# Patient Record
Sex: Male | Born: 1935 | Race: White | Hispanic: No | Smoking: Former smoker
Health system: Southern US, Community
[De-identification: ages and names within clinical notes are randomized; demographics above are authoritative.]

## PROBLEM LIST (undated history)

## (undated) DIAGNOSIS — E559 Vitamin D deficiency, unspecified: Secondary | ICD-10-CM

## (undated) DIAGNOSIS — E875 Hyperkalemia: Secondary | ICD-10-CM

## (undated) DIAGNOSIS — M810 Age-related osteoporosis without current pathological fracture: Secondary | ICD-10-CM

## (undated) DIAGNOSIS — H409 Unspecified glaucoma: Secondary | ICD-10-CM

## (undated) DIAGNOSIS — I739 Peripheral vascular disease, unspecified: Secondary | ICD-10-CM

## (undated) DIAGNOSIS — M1990 Unspecified osteoarthritis, unspecified site: Secondary | ICD-10-CM

## (undated) DIAGNOSIS — I10 Essential (primary) hypertension: Secondary | ICD-10-CM

## (undated) DIAGNOSIS — I714 Abdominal aortic aneurysm, without rupture, unspecified: Secondary | ICD-10-CM

## (undated) DIAGNOSIS — E119 Type 2 diabetes mellitus without complications: Secondary | ICD-10-CM

## (undated) DIAGNOSIS — E785 Hyperlipidemia, unspecified: Secondary | ICD-10-CM

## (undated) DIAGNOSIS — K4090 Unilateral inguinal hernia, without obstruction or gangrene, not specified as recurrent: Secondary | ICD-10-CM

## (undated) DIAGNOSIS — F0390 Unspecified dementia without behavioral disturbance: Secondary | ICD-10-CM

## (undated) DIAGNOSIS — H269 Unspecified cataract: Secondary | ICD-10-CM

## (undated) HISTORY — DX: Age-related osteoporosis without current pathological fracture: M81.0

## (undated) HISTORY — DX: Unspecified cataract: H26.9

## (undated) HISTORY — DX: Abdominal aortic aneurysm, without rupture, unspecified: I71.40

## (undated) HISTORY — DX: Type 2 diabetes mellitus without complications: E11.9

## (undated) HISTORY — DX: Vitamin D deficiency, unspecified: E55.9

## (undated) HISTORY — PX: TOOTH EXTRACTION: SUR596

## (undated) HISTORY — DX: Essential (primary) hypertension: I10

## (undated) HISTORY — DX: Unspecified dementia, unspecified severity, without behavioral disturbance, psychotic disturbance, mood disturbance, and anxiety: F03.90

## (undated) HISTORY — DX: Unspecified osteoarthritis, unspecified site: M19.90

## (undated) HISTORY — DX: Hyperlipidemia, unspecified: E78.5

## (undated) HISTORY — PX: CATARACT EXTRACTION: SUR2

## (undated) HISTORY — DX: Abdominal aortic aneurysm, without rupture: I71.4

## (undated) HISTORY — DX: Hyperkalemia: E87.5

## (undated) HISTORY — DX: Peripheral vascular disease, unspecified: I73.9

## (undated) HISTORY — DX: Unspecified glaucoma: H40.9

---

## 2008-04-10 ENCOUNTER — Ambulatory Visit: Payer: Self-pay | Admitting: Ophthalmology

## 2008-09-26 ENCOUNTER — Emergency Department: Payer: Self-pay | Admitting: Internal Medicine

## 2008-10-03 ENCOUNTER — Emergency Department: Payer: Self-pay | Admitting: Emergency Medicine

## 2010-06-03 ENCOUNTER — Ambulatory Visit: Payer: Self-pay | Admitting: Ophthalmology

## 2012-11-06 ENCOUNTER — Emergency Department: Payer: Self-pay | Admitting: Emergency Medicine

## 2013-06-18 DIAGNOSIS — C4491 Basal cell carcinoma of skin, unspecified: Secondary | ICD-10-CM

## 2013-06-18 HISTORY — DX: Basal cell carcinoma of skin, unspecified: C44.91

## 2014-02-28 ENCOUNTER — Ambulatory Visit: Payer: Self-pay | Admitting: Family Medicine

## 2014-03-28 ENCOUNTER — Ambulatory Visit: Payer: Self-pay | Admitting: Family Medicine

## 2014-04-04 ENCOUNTER — Ambulatory Visit: Payer: Self-pay | Admitting: Family Medicine

## 2014-07-30 ENCOUNTER — Ambulatory Visit: Payer: Self-pay | Admitting: Family Medicine

## 2014-08-06 DIAGNOSIS — M810 Age-related osteoporosis without current pathological fracture: Secondary | ICD-10-CM | POA: Diagnosis not present

## 2014-10-09 DIAGNOSIS — E784 Other hyperlipidemia: Secondary | ICD-10-CM | POA: Diagnosis not present

## 2014-10-09 DIAGNOSIS — I10 Essential (primary) hypertension: Secondary | ICD-10-CM | POA: Diagnosis not present

## 2014-11-04 ENCOUNTER — Encounter: Payer: Self-pay | Admitting: Family Medicine

## 2014-11-05 ENCOUNTER — Encounter: Payer: Self-pay | Admitting: Family Medicine

## 2014-11-05 DIAGNOSIS — K5790 Diverticulosis of intestine, part unspecified, without perforation or abscess without bleeding: Secondary | ICD-10-CM | POA: Insufficient documentation

## 2014-11-05 DIAGNOSIS — M4850XA Collapsed vertebra, not elsewhere classified, site unspecified, initial encounter for fracture: Secondary | ICD-10-CM | POA: Insufficient documentation

## 2014-11-05 DIAGNOSIS — R42 Dizziness and giddiness: Secondary | ICD-10-CM | POA: Insufficient documentation

## 2014-11-05 DIAGNOSIS — E785 Hyperlipidemia, unspecified: Secondary | ICD-10-CM | POA: Insufficient documentation

## 2014-11-05 DIAGNOSIS — E559 Vitamin D deficiency, unspecified: Secondary | ICD-10-CM | POA: Insufficient documentation

## 2014-11-05 DIAGNOSIS — I714 Abdominal aortic aneurysm, without rupture, unspecified: Secondary | ICD-10-CM | POA: Insufficient documentation

## 2014-11-05 DIAGNOSIS — N529 Male erectile dysfunction, unspecified: Secondary | ICD-10-CM | POA: Insufficient documentation

## 2014-11-05 DIAGNOSIS — R9431 Abnormal electrocardiogram [ECG] [EKG]: Secondary | ICD-10-CM | POA: Insufficient documentation

## 2014-11-05 DIAGNOSIS — M545 Low back pain, unspecified: Secondary | ICD-10-CM | POA: Insufficient documentation

## 2014-11-05 DIAGNOSIS — I7143 Infrarenal abdominal aortic aneurysm, without rupture: Secondary | ICD-10-CM | POA: Insufficient documentation

## 2014-11-05 DIAGNOSIS — L821 Other seborrheic keratosis: Secondary | ICD-10-CM | POA: Insufficient documentation

## 2014-11-05 DIAGNOSIS — Z9181 History of falling: Secondary | ICD-10-CM | POA: Insufficient documentation

## 2014-11-05 DIAGNOSIS — Z23 Encounter for immunization: Secondary | ICD-10-CM | POA: Insufficient documentation

## 2014-11-05 DIAGNOSIS — M1990 Unspecified osteoarthritis, unspecified site: Secondary | ICD-10-CM | POA: Insufficient documentation

## 2014-11-05 DIAGNOSIS — M47816 Spondylosis without myelopathy or radiculopathy, lumbar region: Secondary | ICD-10-CM | POA: Insufficient documentation

## 2014-11-05 DIAGNOSIS — E119 Type 2 diabetes mellitus without complications: Secondary | ICD-10-CM | POA: Insufficient documentation

## 2014-12-06 DIAGNOSIS — H4011X3 Primary open-angle glaucoma, severe stage: Secondary | ICD-10-CM | POA: Diagnosis not present

## 2015-01-09 ENCOUNTER — Encounter: Payer: Self-pay | Admitting: Family Medicine

## 2015-01-09 ENCOUNTER — Ambulatory Visit (INDEPENDENT_AMBULATORY_CARE_PROVIDER_SITE_OTHER): Payer: Commercial Managed Care - HMO | Admitting: Family Medicine

## 2015-01-09 VITALS — BP 118/60 | HR 88 | Resp 15 | Ht 67.0 in | Wt 176.8 lb

## 2015-01-09 DIAGNOSIS — E785 Hyperlipidemia, unspecified: Secondary | ICD-10-CM | POA: Diagnosis not present

## 2015-01-09 DIAGNOSIS — I10 Essential (primary) hypertension: Secondary | ICD-10-CM

## 2015-01-09 MED ORDER — LISINOPRIL 5 MG PO TABS: 5.0000 mg | ORAL_TABLET | Freq: Every day | ORAL | Status: DC

## 2015-01-09 MED ORDER — SIMVASTATIN 10 MG PO TABS: 10.0000 mg | ORAL_TABLET | Freq: Every day | ORAL | Status: DC

## 2015-01-09 NOTE — Progress Notes (Signed)
Name: Roy Hernandez   MRN: [id number]    DOB: [birth year]   Date:01/09/2015       Progress Note  Subjective  Chief Complaint  Chief Complaint  Patient presents with  . Hypertension  . Hyperlipidemia    Hypertension This is a chronic problem. The problem is controlled. Pertinent negatives include no blurred vision, chest pain, headaches, malaise/fatigue, palpitations or shortness of breath. Past treatments include ACE inhibitors. The current treatment provides significant improvement. There are no compliance problems.  There is no history of kidney disease, CAD/MI, CVA or heart failure.  Hyperlipidemia This is a chronic problem. Recent lipid tests were reviewed and are normal. Pertinent negatives include no chest pain, leg pain, myalgias or shortness of breath. Current antihyperlipidemic treatment includes statins.      Past Medical History  Diagnosis Date  . Hyperlipidemia   . AAA (abdominal aortic aneurysm)   . Hypertension   . Glaucoma   History reviewed. No pertinent past surgical history. Family History  Problem Relation Age of Onset  . Diabetes Father   . Diabetes Maternal Grandmother      History  Substance Use Topics  . Smoking status: Former Research scientist (life sciences)  . Smokeless tobacco: Never Used  . Alcohol Use: No     Current outpatient prescriptions:  .  alendronate (FOSAMAX) 70 MG tablet, Take 70 mg by mouth once a week. , Disp: , Rfl:  .  aspirin 81 MG tablet, Take 81 mg by mouth daily. , Disp: , Rfl:  .  cholecalciferol (VITAMIN D) 1000 UNITS tablet, Take 1,000 Units by mouth daily. , Disp: , Rfl:  .  latanoprost (XALATAN) 0.005 % ophthalmic solution, Apply to eye., Disp: , Rfl:  .  lisinopril (PRINIVIL,ZESTRIL) 5 MG tablet, 5 mg daily. , Disp: , Rfl:  .  meloxicam (MOBIC) 7.5 MG tablet, Take 7.5 mg by mouth daily. , Disp: , Rfl:  .  simvastatin (ZOCOR) 10 MG tablet, 10 mg daily at 6 PM. , Disp: , Rfl:  .  naproxen (NAPROSYN) 500 MG tablet, Take by mouth., Disp: , Rfl:    No Known Allergies  Review of Systems  Constitutional: Negative for fever, chills and malaise/fatigue.  Eyes: Negative for blurred vision.  Respiratory: Negative for shortness of breath.   Cardiovascular: Negative for chest pain and palpitations.  Musculoskeletal: Negative for myalgias.  Neurological: Negative for headaches.      Objective  Filed Vitals:   01/09/15 1031  BP: 118/60  Pulse: 88  Resp: 15  Height: '5\' 7"'  (1.702 m)  Weight: 176 lb 12.8 oz (80.196 kg)  SpO2: 96%     Physical Exam  Constitutional: He is well-developed, well-nourished, and in no distress.  HENT:  Head: Normocephalic and atraumatic.  Cardiovascular: Normal rate.   Pulmonary/Chest: Effort normal.  Nursing note and vitals reviewed.     No results found for this or any previous visit (from the past 2160 hour(s)).   Assessment & Plan 1. Benign hypertension  - lisinopril (PRINIVIL,ZESTRIL) 5 MG tablet; Take 1 tablet (5 mg total) by mouth daily.  2. Dyslipidemia  - simvastatin (ZOCOR) 10 MG tablet; Take 1 tablet (10 mg total) by mouth daily at 6 PM. - Lipid Profile - Comp Met (CMET)  There are no diagnoses linked to this encounter.   Jearl Soto Asad A. [facility] 01/09/2015 10:45 AM

## 2015-01-10 LAB — COMPREHENSIVE METABOLIC PANEL
ALBUMIN: 3.9 g/dL (ref 3.5–4.7)
ALT: 9 IU/L (ref 0–44)
AST: 16 IU/L (ref 0–40)
Albumin/Globulin Ratio: 1.3 (ref 1.1–2.5)
Alkaline Phosphatase: 67 IU/L (ref 39–117)
BUN / CREAT RATIO: 16 (ref 10–22)
BUN: 14 mg/dL (ref 8–27)
Bilirubin Total: 0.7 mg/dL (ref 0.0–1.2)
CALCIUM: 9.1 mg/dL (ref 8.6–10.2)
CHLORIDE: 102 mmol/L (ref 97–108)
CO2: 25 mmol/L (ref 18–29)
CREATININE: 0.85 mg/dL (ref 0.76–1.27)
GFR calc Af Amer: 92 mL/min/{1.73_m2} (ref >59)
GFR, EST NON AFRICAN AMERICAN: 79 mL/min/{1.73_m2} (ref >59)
GLOBULIN, TOTAL: 2.9 g/dL (ref 1.5–4.5)
GLUCOSE: 104 mg/dL — AB (ref 65–99)
POTASSIUM: 4.3 mmol/L (ref 3.5–5.2)
SODIUM: 142 mmol/L (ref 134–144)
Total Protein: 6.8 g/dL (ref 6.0–8.5)

## 2015-01-10 LAB — LIPID PANEL
CHOL/HDL RATIO: 2.8 ratio (ref 0.0–5.0)
CHOLESTEROL TOTAL: 111 mg/dL (ref 100–199)
HDL: 40 mg/dL (ref >39)
LDL CALC: 62 mg/dL (ref 0–99)
Triglycerides: 46 mg/dL (ref 0–149)
VLDL Cholesterol Cal: 9 mg/dL (ref 5–40)

## 2015-03-25 ENCOUNTER — Ambulatory Visit (INDEPENDENT_AMBULATORY_CARE_PROVIDER_SITE_OTHER): Payer: Commercial Managed Care - HMO | Admitting: Family Medicine

## 2015-03-25 ENCOUNTER — Encounter: Payer: Self-pay | Admitting: Family Medicine

## 2015-03-25 VITALS — BP 120/80 | HR 78 | Temp 98.0°F | Resp 19 | Ht 67.0 in | Wt 174.7 lb

## 2015-03-25 DIAGNOSIS — R42 Dizziness and giddiness: Secondary | ICD-10-CM

## 2015-03-25 NOTE — Progress Notes (Signed)
Name: Roy Hernandez   MRN: [id number]    DOB: [birth year]   Date:03/25/2015       Progress Note  Subjective  Chief Complaint  Chief Complaint  Patient presents with  . Acute Visit    BP check/dizziness  . Hypertension  . Hyperlipidemia  . Diabetes    Hypertension This is a chronic problem. The problem is controlled. Pertinent negatives include no chest pain, headaches, palpitations or shortness of breath. Past treatments include ACE inhibitors. There is no history of kidney disease, CAD/MI or CVA.  Patient reports dizziness upon standing from a seated position, which resolves within a few seconds. No other symptoms reported.   Past Medical History  Diagnosis Date  . Hyperlipidemia   . AAA (abdominal aortic aneurysm)   . Hypertension   . Glaucoma     History reviewed. No pertinent past surgical history.  Family History  Problem Relation Age of Onset  . Diabetes Father   . Diabetes Maternal Grandmother     Social History   Social History  . Marital Status: Married    Spouse Name: N/A  . Number of Children: N/A  . Years of Education: N/A   Occupational History  . Not on file.   Social History Main Topics  . Smoking status: Former Research scientist (life sciences)  . Smokeless tobacco: Never Used  . Alcohol Use: No  . Drug Use: No  . Sexual Activity: Not Currently   Other Topics Concern  . Not on file   Social History Narrative     Current outpatient prescriptions:  .  alendronate (FOSAMAX) 70 MG tablet, Take 70 mg by mouth once a week. , Disp: , Rfl:  .  aspirin 81 MG tablet, Take 81 mg by mouth daily. , Disp: , Rfl:  .  cholecalciferol (VITAMIN D) 1000 UNITS tablet, Take 1,000 Units by mouth daily. , Disp: , Rfl:  .  latanoprost (XALATAN) 0.005 % ophthalmic solution, Apply to eye., Disp: , Rfl:  .  lisinopril (PRINIVIL,ZESTRIL) 5 MG tablet, Take 1 tablet (5 mg total) by mouth daily., Disp: , Rfl:  .  meloxicam (MOBIC) 7.5 MG tablet, Take 7.5 mg by mouth daily. , Disp: , Rfl:  .   simvastatin (ZOCOR) 10 MG tablet, Take 1 tablet (10 mg total) by mouth daily at 6 PM., Disp: , Rfl:   No Known Allergies   Review of Systems  HENT: Negative for ear pain.   Respiratory: Negative for shortness of breath.   Cardiovascular: Negative for chest pain and palpitations.  Gastrointestinal: Negative for nausea, vomiting and abdominal pain.  Neurological: Negative for headaches.      Objective  Filed Vitals:   03/25/15 1556  BP: 120/80  Pulse: 78  Temp: 98 F (36.7 C)  TempSrc: Oral  Resp: 19  Height: 5\' 7"  (1.702 m)  Weight: 174 lb 11.2 oz (79.243 kg)  SpO2: 94%    Physical Exam  Constitutional: He is oriented to person, place, and time and well-developed, well-nourished, and in no distress.  HENT:  Head: Normocephalic and atraumatic.  Right Ear: Tympanic membrane and ear canal normal.  Left Ear: Tympanic membrane and ear canal normal.  Eyes: Pupils are equal, round, and reactive to light.  Cardiovascular: Normal rate and regular rhythm.   Pulmonary/Chest: Effort normal and breath sounds normal.  Abdominal: Soft. Bowel sounds are normal.  Musculoskeletal: He exhibits no edema.  Neurological: He is alert and oriented to person, place, and time.  Skin: Skin is warm and  dry.  Psychiatric: Mood, memory, affect and judgment normal.  Nursing note and vitals reviewed.   Assessment & Plan  1. Orthostatic dizziness Blood pressure is stable in her office today. Orthostatic vitals were normal. DC lisinopril and patient will check his blood pressure every day. Obtain labs for evaluation of dizziness.  - CBC w/Diff/Platelet - Comprehensive Metabolic Panel (CMET)   Wilbert Schouten Asad A. [facility] 03/25/2015 4:03 PM

## 2015-03-26 LAB — COMPREHENSIVE METABOLIC PANEL
A/G RATIO: 1.5 (ref 1.1–2.5)
ALT: 7 IU/L (ref 0–44)
AST: 15 IU/L (ref 0–40)
Albumin: 4 g/dL (ref 3.5–4.7)
Alkaline Phosphatase: 61 IU/L (ref 39–117)
BILIRUBIN TOTAL: 0.4 mg/dL (ref 0.0–1.2)
BUN/Creatinine Ratio: 16 (ref 10–22)
BUN: 14 mg/dL (ref 8–27)
CHLORIDE: 100 mmol/L (ref 97–108)
CO2: 24 mmol/L (ref 18–29)
Calcium: 9.1 mg/dL (ref 8.6–10.2)
Creatinine, Ser: 0.9 mg/dL (ref 0.76–1.27)
GFR calc Af Amer: 90 mL/min/{1.73_m2} (ref >59)
GFR, EST NON AFRICAN AMERICAN: 78 mL/min/{1.73_m2} (ref >59)
GLOBULIN, TOTAL: 2.6 g/dL (ref 1.5–4.5)
Glucose: 97 mg/dL (ref 65–99)
Potassium: 4.4 mmol/L (ref 3.5–5.2)
Sodium: 140 mmol/L (ref 134–144)
Total Protein: 6.6 g/dL (ref 6.0–8.5)

## 2015-03-26 LAB — CBC WITH DIFFERENTIAL/PLATELET
BASOS: 1 %
Basophils Absolute: 0 10*3/uL (ref 0.0–0.2)
EOS (ABSOLUTE): 0.2 10*3/uL (ref 0.0–0.4)
Eos: 3 %
Hematocrit: 39.7 % (ref 37.5–51.0)
Hemoglobin: 13 g/dL (ref 12.6–17.7)
Immature Grans (Abs): 0 10*3/uL (ref 0.0–0.1)
Immature Granulocytes: 0 %
LYMPHS ABS: 1.8 10*3/uL (ref 0.7–3.1)
Lymphs: 31 %
MCH: 31.3 pg (ref 26.6–33.0)
MCHC: 32.7 g/dL (ref 31.5–35.7)
MCV: 96 fL (ref 79–97)
Monocytes Absolute: 0.5 10*3/uL (ref 0.1–0.9)
Monocytes: 8 %
NEUTROS ABS: 3.3 10*3/uL (ref 1.4–7.0)
NEUTROS PCT: 57 %
PLATELETS: 206 10*3/uL (ref 150–379)
RBC: 4.15 x10E6/uL (ref 4.14–5.80)
RDW: 12.8 % (ref 12.3–15.4)
WBC: 5.7 10*3/uL (ref 3.4–10.8)

## 2015-04-16 ENCOUNTER — Ambulatory Visit: Payer: Commercial Managed Care - HMO | Admitting: Family Medicine

## 2015-04-25 ENCOUNTER — Ambulatory Visit: Payer: Commercial Managed Care - HMO | Admitting: Family Medicine

## 2015-04-30 ENCOUNTER — Encounter: Payer: Self-pay | Admitting: Family Medicine

## 2015-04-30 ENCOUNTER — Ambulatory Visit (INDEPENDENT_AMBULATORY_CARE_PROVIDER_SITE_OTHER): Payer: Commercial Managed Care - HMO | Admitting: Family Medicine

## 2015-04-30 VITALS — BP 121/80 | HR 78 | Temp 98.0°F | Resp 18 | Ht 67.0 in | Wt 176.8 lb

## 2015-04-30 DIAGNOSIS — H6123 Impacted cerumen, bilateral: Secondary | ICD-10-CM | POA: Diagnosis not present

## 2015-04-30 DIAGNOSIS — Z136 Encounter for screening for cardiovascular disorders: Secondary | ICD-10-CM | POA: Diagnosis not present

## 2015-04-30 DIAGNOSIS — Z23 Encounter for immunization: Secondary | ICD-10-CM

## 2015-04-30 DIAGNOSIS — IMO0001 Reserved for inherently not codable concepts without codable children: Secondary | ICD-10-CM

## 2015-04-30 NOTE — Progress Notes (Signed)
Name: Roy Hernandez   MRN: [id number]    DOB: 04-01-1928   Date:04/30/2015       Progress Note Subjective  Chief Complaint  Chief Complaint  Patient presents with  . Follow-up    1 mo  . Hyperlipidemia    Hypertension This is a chronic problem. The problem is unchanged. The problem is controlled. Pertinent negatives include no blurred vision, chest pain, headaches or palpitations. Past treatments include nothing (stopped Lisinopril.). There are no compliance problems.    Past Medical History  Diagnosis Date  . Hyperlipidemia   . AAA (abdominal aortic aneurysm)   . Hypertension   . Glaucoma     History reviewed. No pertinent past surgical history.  Family History  Problem Relation Age of Onset  . Diabetes Father   . Diabetes Maternal Grandmother     Social History   Social History  . Marital Status: Married    Spouse Name: N/A  . Number of Children: N/A  . Years of Education: N/A   Occupational History  . Not on file.   Social History Main Topics  . Smoking status: Former Research scientist (life sciences)  . Smokeless tobacco: Never Used  . Alcohol Use: No  . Drug Use: No  . Sexual Activity: Not Currently   Other Topics Concern  . Not on file   Social History Narrative    Current outpatient prescriptions:  .  alendronate (FOSAMAX) 70 MG tablet, Take 70 mg by mouth once a week. , Disp: , Rfl:  .  aspirin 81 MG tablet, Take 81 mg by mouth daily. , Disp: , Rfl:  .  cholecalciferol (VITAMIN D) 1000 UNITS tablet, Take 1,000 Units by mouth daily. , Disp: , Rfl:  .  latanoprost (XALATAN) 0.005 % ophthalmic solution, Apply to eye., Disp: , Rfl:  .  meloxicam (MOBIC) 7.5 MG tablet, Take 7.5 mg by mouth daily. , Disp: , Rfl:  .  Naproxen (NAPROSYN PO), , Disp: , Rfl:  .  ONE TOUCH ULTRA TEST test strip, , Disp: , Rfl: 1 .  simvastatin (ZOCOR) 10 MG tablet, Take 1 tablet (10 mg total) by mouth daily at 6 PM., Disp: , Rfl:  .  timolol (TIMOPTIC) 0.5 % ophthalmic solution, , Disp: , Rfl:   No  Known Allergies   Review of Systems  Eyes: Negative for blurred vision.  Cardiovascular: Negative for chest pain and palpitations.  Neurological: Negative for headaches.   Objective  Filed Vitals:   04/30/15 1445  BP: 121/80  Pulse: 78  Temp: 98 F (36.7 C)  TempSrc: Oral  Resp: 18  Height: 5\' 7"  (1.702 m)  Weight: 176 lb 12.8 oz (80.196 kg)  SpO2: 94%    Physical Exam  Constitutional: He is well-developed, well-nourished, and in no distress.  Cardiovascular: Normal rate and regular rhythm.   Pulmonary/Chest: Effort normal and breath sounds normal.  Abdominal: Soft. Bowel sounds are normal.  Nursing note and vitals reviewed.   Assessment & Plan  1. Need for influenza vaccination  - Flu vaccine HIGH DOSE PF (Fluzone High dose)  2. Cerumen impaction, bilateral  - Ear Lavage  3. Normal blood pressure The pressure is within the normal range. Patient is off of lisinopril. Reassured.    Syed Asad A. [facility] 04/30/2015 2:58 PM

## 2015-05-19 DIAGNOSIS — I739 Peripheral vascular disease, unspecified: Secondary | ICD-10-CM | POA: Diagnosis not present

## 2015-05-19 DIAGNOSIS — I724 Aneurysm of artery of lower extremity: Secondary | ICD-10-CM | POA: Diagnosis not present

## 2015-05-19 DIAGNOSIS — I10 Essential (primary) hypertension: Secondary | ICD-10-CM | POA: Diagnosis not present

## 2015-05-19 DIAGNOSIS — I714 Abdominal aortic aneurysm, without rupture: Secondary | ICD-10-CM | POA: Diagnosis not present

## 2015-05-19 DIAGNOSIS — E785 Hyperlipidemia, unspecified: Secondary | ICD-10-CM | POA: Diagnosis not present

## 2015-05-27 ENCOUNTER — Ambulatory Visit
Admission: RE | Admit: 2015-05-27 | Discharge: 2015-05-27 | Disposition: A | Discharge status: Home / Self Care | Payer: Commercial Managed Care - HMO | Source: Ambulatory Visit | Attending: Family Medicine | Admitting: Family Medicine

## 2015-05-27 ENCOUNTER — Ambulatory Visit (INDEPENDENT_AMBULATORY_CARE_PROVIDER_SITE_OTHER): Payer: Commercial Managed Care - HMO | Admitting: Family Medicine

## 2015-05-27 ENCOUNTER — Encounter: Payer: Self-pay | Admitting: Family Medicine

## 2015-05-27 VITALS — BP 128/64 | HR 79 | Temp 97.4°F | Resp 18 | Ht 67.0 in | Wt 176.0 lb

## 2015-05-27 DIAGNOSIS — M25559 Pain in unspecified hip: Secondary | ICD-10-CM | POA: Diagnosis present

## 2015-05-27 DIAGNOSIS — M25551 Pain in right hip: Secondary | ICD-10-CM

## 2015-05-27 DIAGNOSIS — M8580 Other specified disorders of bone density and structure, unspecified site: Secondary | ICD-10-CM | POA: Insufficient documentation

## 2015-05-27 DIAGNOSIS — M160 Bilateral primary osteoarthritis of hip: Secondary | ICD-10-CM | POA: Diagnosis not present

## 2015-05-27 NOTE — Progress Notes (Signed)
Name: Roy Hernandez   MRN: [id number]    DOB: January 24, 1928   Date:05/27/2015       Progress Note  Subjective  Chief Complaint  Chief Complaint  Patient presents with  . Fall    Onset-2 weeks, fell at home when walking to the bathroom at 2 a.m. on right hip has a contusion.  Patient right hip has been bothering him since the fall and putting muscle cream on sore hip.     Fall The accident occurred more than 1 week ago (10 days ago). The fall occurred while walking. He fell from a height of 3 to 5 ft. He landed on carpet. The volume of blood lost was minimal. The point of impact was the buttocks and right hip. The pain is present in the right hip. The pain is moderate. The symptoms are aggravated by ambulation (especially on hard surfaces). Pertinent negatives include no abdominal pain, fever, hematuria, numbness or tingling. He has tried rest (used a muscle rub on the affected area.) for the symptoms.    Past Medical History  Diagnosis Date  . Hyperlipidemia   . AAA (abdominal aortic aneurysm) ([place])   . Hypertension   . Glaucoma     No past surgical history on file.  Family History  Problem Relation Age of Onset  . Diabetes Father   . Diabetes Maternal Grandmother     Social History   Social History  . Marital Status: Married    Spouse Name: N/A  . Number of Children: N/A  . Years of Education: N/A   Occupational History  . Not on file.   Social History Main Topics  . Smoking status: Former Smoker -- 34 years    Types: Cigarettes    Start date: 08/02/1945    Quit date: 05/26/1980  . Smokeless tobacco: Never Used  . Alcohol Use: No  . Drug Use: No  . Sexual Activity: Not Currently   Other Topics Concern  . Not on file   Social History Narrative     Current outpatient prescriptions:  .  Alcohol Swabs (ALCOHOL PREP) PADS, , Disp: , Rfl:  .  alendronate (FOSAMAX) 70 MG tablet, Take 70 mg by mouth once a week. , Disp: , Rfl:  .  aspirin 81 MG tablet, Take 81 mg  by mouth daily. , Disp: , Rfl:  .  cholecalciferol (VITAMIN D) 1000 UNITS tablet, Take 1,000 Units by mouth daily. , Disp: , Rfl:  .  latanoprost (XALATAN) 0.005 % ophthalmic solution, Apply to eye., Disp: , Rfl:  .  Naproxen (NAPROSYN PO), , Disp: , Rfl:  .  ONE TOUCH ULTRA TEST test strip, , Disp: , Rfl: 1 .  simvastatin (ZOCOR) 10 MG tablet, Take 1 tablet (10 mg total) by mouth daily at 6 PM., Disp: , Rfl:  .  timolol (TIMOPTIC) 0.5 % ophthalmic solution, , Disp: , Rfl:   No Known Allergies   Review of Systems  Constitutional: Negative for fever and chills.  Gastrointestinal: Negative for abdominal pain.  Genitourinary: Negative for dysuria and hematuria.  Musculoskeletal: Positive for joint pain.  Neurological: Negative for dizziness, tingling and numbness.      Objective  Filed Vitals:   05/27/15 1145  BP: 128/64  Pulse: 79  Temp: 97.4 F (36.3 C)  TempSrc: Oral  Resp: 18  Height: 5\' 7"  (1.702 m)  Weight: 176 lb (79.833 kg)  SpO2: 96%    Physical Exam  Constitutional: He is well-developed, well-nourished, and in  no distress.  Musculoskeletal:       Right hip: He exhibits normal range of motion, normal strength, no tenderness, no swelling, no deformity and no laceration.  Nursing note and vitals reviewed.  Assessment & Plan  1. Acute pain of right hip We will rule out acute fracture. An x-ray of right hip and pelvis. Encouraged to take Tylenol as needed and apply a heating pad to the affected area. Hematoma has resolved. - DG HIP UNILAT WITH PELVIS 2-3 VIEWS RIGHT; Future   Elea Holtzclaw Asad A. [facility] 05/27/2015 11:59 AM

## 2015-06-05 DIAGNOSIS — H401133 Primary open-angle glaucoma, bilateral, severe stage: Secondary | ICD-10-CM | POA: Diagnosis not present

## 2015-06-13 DIAGNOSIS — H401133 Primary open-angle glaucoma, bilateral, severe stage: Secondary | ICD-10-CM | POA: Diagnosis not present

## 2015-06-30 DIAGNOSIS — D1800 Hemangioma unspecified site: Secondary | ICD-10-CM | POA: Diagnosis not present

## 2015-06-30 DIAGNOSIS — L700 Acne vulgaris: Secondary | ICD-10-CM | POA: Diagnosis not present

## 2015-06-30 DIAGNOSIS — D229 Melanocytic nevi, unspecified: Secondary | ICD-10-CM | POA: Diagnosis not present

## 2015-06-30 DIAGNOSIS — Z85828 Personal history of other malignant neoplasm of skin: Secondary | ICD-10-CM | POA: Diagnosis not present

## 2015-06-30 DIAGNOSIS — D692 Other nonthrombocytopenic purpura: Secondary | ICD-10-CM | POA: Diagnosis not present

## 2015-06-30 DIAGNOSIS — L821 Other seborrheic keratosis: Secondary | ICD-10-CM | POA: Diagnosis not present

## 2015-06-30 DIAGNOSIS — L570 Actinic keratosis: Secondary | ICD-10-CM | POA: Diagnosis not present

## 2015-06-30 DIAGNOSIS — Z1283 Encounter for screening for malignant neoplasm of skin: Secondary | ICD-10-CM | POA: Diagnosis not present

## 2015-07-11 ENCOUNTER — Encounter: Payer: Self-pay | Admitting: Family Medicine

## 2015-07-11 ENCOUNTER — Other Ambulatory Visit: Payer: Self-pay | Admitting: Family Medicine

## 2015-07-11 ENCOUNTER — Ambulatory Visit (INDEPENDENT_AMBULATORY_CARE_PROVIDER_SITE_OTHER): Payer: Commercial Managed Care - HMO | Admitting: Family Medicine

## 2015-07-11 VITALS — BP 130/65 | HR 73 | Temp 98.1°F | Resp 17 | Ht 67.0 in | Wt 179.1 lb

## 2015-07-11 DIAGNOSIS — IMO0001 Reserved for inherently not codable concepts without codable children: Secondary | ICD-10-CM

## 2015-07-11 DIAGNOSIS — I714 Abdominal aortic aneurysm, without rupture, unspecified: Secondary | ICD-10-CM

## 2015-07-11 DIAGNOSIS — E785 Hyperlipidemia, unspecified: Secondary | ICD-10-CM | POA: Diagnosis not present

## 2015-07-11 DIAGNOSIS — R7309 Other abnormal glucose: Secondary | ICD-10-CM | POA: Diagnosis not present

## 2015-07-11 DIAGNOSIS — Z136 Encounter for screening for cardiovascular disorders: Secondary | ICD-10-CM

## 2015-07-11 LAB — GLUCOSE, POCT (MANUAL RESULT ENTRY): POC Glucose: 118 mg/dl — AB (ref 70–99)

## 2015-07-11 LAB — POCT GLYCOSYLATED HEMOGLOBIN (HGB A1C): HEMOGLOBIN A1C: 5.4

## 2015-07-11 NOTE — Progress Notes (Signed)
Name: Roy Hernandez   MRN: [id number]    DOB: [birth year]   Date:07/11/2015       Progress Note  Subjective  Chief Complaint  Chief Complaint  Patient presents with  . Follow-up    6 mo  . Hyperlipidemia  . Hypertension    Hyperlipidemia This is a chronic problem. The problem is controlled. Recent lipid tests were reviewed and are normal. Pertinent negatives include no chest pain, leg pain, myalgias or shortness of breath. Current antihyperlipidemic treatment includes statins. There are no compliance problems.   Hypertension This is a chronic problem. The problem is unchanged. The problem is controlled. Pertinent negatives include no blurred vision, chest pain, headaches, orthopnea, palpitations or shortness of breath. Past treatments include nothing. There are no compliance problems.  There is no history of kidney disease, CAD/MI or CVA.   Pt. Has history of Diabetes, last A1c 5.7 %, controlled on dietary and lifestyle therapies only. No medications. Will repeat A1c today.  Past Medical History  Diagnosis Date  . Hyperlipidemia   . AAA (abdominal aortic aneurysm) ([place])   . Hypertension   . Glaucoma     History reviewed. No pertinent past surgical history.  Family History  Problem Relation Age of Onset  . Diabetes Father   . Diabetes Maternal Grandmother     Social History   Social History  . Marital Status: Married    Spouse Name: N/A  . Number of Children: N/A  . Years of Education: N/A   Occupational History  . Not on file.   Social History Main Topics  . Smoking status: Former Smoker -- 34 years    Types: Cigarettes    Start date: 08/02/1945    Quit date: 05/26/1980  . Smokeless tobacco: Never Used  . Alcohol Use: No  . Drug Use: No  . Sexual Activity: Not Currently   Other Topics Concern  . Not on file   Social History Narrative     Current outpatient prescriptions:  .  Alcohol Swabs (ALCOHOL PREP) PADS, , Disp: , Rfl:  .  alendronate (FOSAMAX) 70  MG tablet, Take 70 mg by mouth once a week. , Disp: , Rfl:  .  aspirin 81 MG tablet, Take 81 mg by mouth daily. , Disp: , Rfl:  .  cholecalciferol (VITAMIN D) 1000 UNITS tablet, Take 1,000 Units by mouth daily. , Disp: , Rfl:  .  latanoprost (XALATAN) 0.005 % ophthalmic solution, Apply to eye., Disp: , Rfl:  .  Naproxen (NAPROSYN PO), , Disp: , Rfl:  .  ONE TOUCH ULTRA TEST test strip, , Disp: , Rfl: 1 .  Phenylephrine-Acetaminophen 5-325 MG TABS, , Disp: , Rfl:  .  simvastatin (ZOCOR) 10 MG tablet, Take 1 tablet (10 mg total) by mouth daily at 6 PM., Disp: , Rfl:  .  timolol (TIMOPTIC) 0.5 % ophthalmic solution, , Disp: , Rfl:   No Known Allergies   Review of Systems  Eyes: Negative for blurred vision.  Respiratory: Negative for shortness of breath.   Cardiovascular: Negative for chest pain, palpitations and orthopnea.  Musculoskeletal: Negative for myalgias.  Neurological: Negative for headaches.     Objective  Filed Vitals:   07/11/15 0802  BP: 130/65  Pulse: 73  Temp: 98.1 F (36.7 C)  TempSrc: Oral  Resp: 17  Height: 5\' 7"  (1.702 m)  Weight: 179 lb 1.6 oz (81.239 kg)  SpO2: 96%    Physical Exam  Constitutional: He is oriented to person, place,  and time and well-developed, well-nourished, and in no distress.  HENT:  Head: Normocephalic and atraumatic.  Eyes: Pupils are equal, round, and reactive to light.  Cardiovascular: Normal rate, regular rhythm and normal heart sounds.   Pulmonary/Chest: Effort normal and breath sounds normal.  Abdominal: Soft. Bowel sounds are normal.  Musculoskeletal: He exhibits no edema.  Neurological: He is alert and oriented to person, place, and time.  Nursing note and vitals reviewed.    Assessment & Plan  1. AAA (abdominal aortic aneurysm) without rupture [facility]) Being followed by Vascular Surgery. Notes not available for review but pt. Tells me that the aneurysm has grown 'just a little.' No records available. BP  well-controlled.  2. Elevated glycated hemoglobin In the past. Will obtain updated Blood Glucose and A1c today. - POCT HgB A1C - POCT Glucose (CBG)  3. Normal blood pressure BP at goal, off anti-hypertensive therapy. - Comprehensive Metabolic Panel (CMET)  4. Hyperlipidemia  - Lipid Profile   Serayah Yazdani Asad A. [facility] 07/11/2015 8:23 AM

## 2015-07-12 LAB — COMPREHENSIVE METABOLIC PANEL
ALK PHOS: 67 IU/L (ref 39–117)
ALT: 9 IU/L (ref 0–44)
AST: 19 IU/L (ref 0–40)
Albumin/Globulin Ratio: 1.5 (ref 1.1–2.5)
Albumin: 4.1 g/dL (ref 3.5–4.7)
BUN/Creatinine Ratio: 18 (ref 10–22)
BUN: 15 mg/dL (ref 8–27)
Bilirubin Total: 0.6 mg/dL (ref 0.0–1.2)
CO2: 26 mmol/L (ref 18–29)
CREATININE: 0.83 mg/dL (ref 0.76–1.27)
Calcium: 9.7 mg/dL (ref 8.6–10.2)
Chloride: 103 mmol/L (ref 97–106)
GFR calc Af Amer: 93 mL/min/{1.73_m2} (ref >59)
GFR calc non Af Amer: 80 mL/min/{1.73_m2} (ref >59)
GLOBULIN, TOTAL: 2.8 g/dL (ref 1.5–4.5)
GLUCOSE: 109 mg/dL — AB (ref 65–99)
Potassium: 5.3 mmol/L — ABNORMAL HIGH (ref 3.5–5.2)
SODIUM: 145 mmol/L — AB (ref 136–144)
Total Protein: 6.9 g/dL (ref 6.0–8.5)

## 2015-07-12 LAB — LIPID PANEL
CHOL/HDL RATIO: 3 ratio (ref 0.0–5.0)
Cholesterol, Total: 119 mg/dL (ref 100–199)
HDL: 40 mg/dL (ref >39)
LDL CALC: 69 mg/dL (ref 0–99)
Triglycerides: 51 mg/dL (ref 0–149)
VLDL CHOLESTEROL CAL: 10 mg/dL (ref 5–40)

## 2015-07-22 ENCOUNTER — Telehealth: Payer: Self-pay | Admitting: Family Medicine

## 2015-07-22 NOTE — Telephone Encounter (Signed)
Patient was notified that he has 11 refills on this medication and he can call pharmacy and refill, patient verbalized understanding

## 2015-07-22 NOTE — Telephone Encounter (Signed)
Pt has questions about his strips. Please call him back.

## 2015-07-29 ENCOUNTER — Telehealth: Payer: Self-pay | Admitting: Family Medicine

## 2015-07-29 NOTE — Telephone Encounter (Signed)
REFILL NEEDED FOR SIMVASTATIN. PHARM IS [facility].

## 2015-07-30 ENCOUNTER — Ambulatory Visit: Payer: Commercial Managed Care - HMO | Admitting: Family Medicine

## 2015-07-31 NOTE — Telephone Encounter (Signed)
ERRENOUS °

## 2015-08-19 ENCOUNTER — Ambulatory Visit
Admission: RE | Admit: 2015-08-19 | Discharge: 2015-08-19 | Disposition: A | Discharge status: Home / Self Care | Payer: PPO | Source: Ambulatory Visit | Attending: Family Medicine | Admitting: Family Medicine

## 2015-08-19 ENCOUNTER — Encounter: Payer: Self-pay | Admitting: Family Medicine

## 2015-08-19 ENCOUNTER — Ambulatory Visit (INDEPENDENT_AMBULATORY_CARE_PROVIDER_SITE_OTHER): Payer: PPO | Admitting: Family Medicine

## 2015-08-19 VITALS — BP 130/62 | HR 93 | Temp 98.7°F | Resp 19 | Ht 67.0 in | Wt 179.0 lb

## 2015-08-19 DIAGNOSIS — M47816 Spondylosis without myelopathy or radiculopathy, lumbar region: Secondary | ICD-10-CM | POA: Insufficient documentation

## 2015-08-19 DIAGNOSIS — M25552 Pain in left hip: Secondary | ICD-10-CM | POA: Diagnosis not present

## 2015-08-19 DIAGNOSIS — M4856XA Collapsed vertebra, not elsewhere classified, lumbar region, initial encounter for fracture: Secondary | ICD-10-CM | POA: Diagnosis not present

## 2015-08-19 DIAGNOSIS — I2510 Atherosclerotic heart disease of native coronary artery without angina pectoris: Secondary | ICD-10-CM | POA: Diagnosis not present

## 2015-08-19 DIAGNOSIS — M160 Bilateral primary osteoarthritis of hip: Secondary | ICD-10-CM | POA: Insufficient documentation

## 2015-08-19 DIAGNOSIS — M8580 Other specified disorders of bone density and structure, unspecified site: Secondary | ICD-10-CM | POA: Diagnosis not present

## 2015-08-19 MED ORDER — TIZANIDINE HCL 2 MG PO TABS: 2.0000 mg | ORAL_TABLET | Freq: Three times a day (TID) | ORAL | Status: DC | PRN

## 2015-08-19 MED ORDER — NAPROXEN 250 MG PO TABS: 250.0000 mg | ORAL_TABLET | Freq: Two times a day (BID) | ORAL | Status: DC

## 2015-08-19 NOTE — Progress Notes (Signed)
Name: RAYSHAUN ALFIERI   MRN: [id number]    DOB: 1927/10/24   Date:08/19/2015       Progress Note  Subjective  Chief Complaint  Chief Complaint  Patient presents with  . Follow-up    Left hip pain  . Hyperlipidemia    Hip Pain  Incident onset: At home 4 days ago when he was vacuuming. The incident occurred at home. The injury mechanism was a twisting injury. The pain is present in the left hip. Quality: sharp non-radiating pain. The pain is at a severity of 5/10. The pain has been fluctuating since onset. Pertinent negatives include no inability to bear weight, loss of motion, numbness or tingling. The symptoms are aggravated by movement (changing position, ). He has tried heat for the symptoms.    Past Medical History  Diagnosis Date  . Hyperlipidemia   . AAA (abdominal aortic aneurysm) ([place])   . Hypertension   . Glaucoma     History reviewed. No pertinent past surgical history.  Family History  Problem Relation Age of Onset  . Diabetes Father   . Diabetes Maternal Grandmother     Social History   Social History  . Marital Status: Married    Spouse Name: N/A  . Number of Children: N/A  . Years of Education: N/A   Occupational History  . Not on file.   Social History Main Topics  . Smoking status: Former Smoker -- 34 years    Types: Cigarettes    Start date: 08/02/1945    Quit date: 05/26/1980  . Smokeless tobacco: Never Used  . Alcohol Use: No  . Drug Use: No  . Sexual Activity: Not Currently   Other Topics Concern  . Not on file   Social History Narrative     Current outpatient prescriptions:  .  Alcohol Swabs (ALCOHOL PREP) PADS, , Disp: , Rfl:  .  alendronate (FOSAMAX) 70 MG tablet, take 1 tablet by mouth every week, Disp: 12 tablet, Rfl: 3 .  aspirin 81 MG tablet, Take 81 mg by mouth daily. , Disp: , Rfl:  .  cholecalciferol (VITAMIN D) 1000 UNITS tablet, Take 1,000 Units by mouth daily. , Disp: , Rfl:  .  latanoprost (XALATAN) 0.005 % ophthalmic  solution, Apply to eye., Disp: , Rfl:  .  ONE TOUCH ULTRA TEST test strip, TEST BLOOD SUGAR ONCE A DAY, Disp: 30 each, Rfl: 11 .  simvastatin (ZOCOR) 10 MG tablet, Take 1 tablet (10 mg total) by mouth daily at 6 PM., Disp: , Rfl:  .  timolol (TIMOPTIC) 0.5 % ophthalmic solution, , Disp: , Rfl:   No Known Allergies   Review of Systems  Constitutional: Negative for fever, chills, weight loss and malaise/fatigue.  Musculoskeletal: Positive for myalgias and back pain.  Neurological: Negative for tingling and numbness.     Objective  Filed Vitals:   08/19/15 1326  BP: 130/62  Pulse: 93  Temp: 98.7 F (37.1 C)  TempSrc: Oral  Resp: 19  Height: 5\' 7"  (1.702 m)  Weight: 179 lb (81.194 kg)  SpO2: 94%    Physical Exam  Musculoskeletal:       Left hip: He exhibits tenderness. He exhibits no swelling and no deformity.       Legs: Mild tenderness to palpation over the left superior gluteal area, pain worse with flexion and internal rotation of left hip.  Nursing note and vitals reviewed.     Assessment & Plan  1. Acute pain of left hip  Likely from muscle spasm. Will start on NSAID and muscle relaxant therapy. Obtain x-ray of left hip for evaluation. - DG HIP UNILAT WITH PELVIS 2-3 VIEWS LEFT; Future - naproxen (NAPROSYN) 250 MG tablet; Take 1 tablet (250 mg total) by mouth 2 (two) times daily with a meal.  Dispense: 20 tablet; Refill: 0 - tiZANidine (ZANAFLEX) 2 MG tablet; Take 1 tablet (2 mg total) by mouth every 8 (eight) hours as needed for muscle spasms.  Dispense: 30 tablet; Refill: 0  Kayah Hecker Asad A. [facility] 08/19/2015 1:46 PM

## 2015-08-27 ENCOUNTER — Encounter: Payer: Self-pay | Admitting: Family Medicine

## 2015-08-27 ENCOUNTER — Ambulatory Visit (INDEPENDENT_AMBULATORY_CARE_PROVIDER_SITE_OTHER): Payer: PPO | Admitting: Family Medicine

## 2015-08-27 VITALS — BP 138/68 | HR 88 | Temp 97.5°F | Resp 16 | Ht 67.0 in | Wt 180.2 lb

## 2015-08-27 DIAGNOSIS — M1990 Unspecified osteoarthritis, unspecified site: Secondary | ICD-10-CM | POA: Diagnosis not present

## 2015-08-27 DIAGNOSIS — M1612 Unilateral primary osteoarthritis, left hip: Secondary | ICD-10-CM

## 2015-08-27 NOTE — Progress Notes (Signed)
Name: Roy Hernandez   MRN: [id number]    DOB: 23-Jan-1928   Date:08/27/2015       Progress Note  Subjective  Chief Complaint  Chief Complaint  Patient presents with  . acute hip pain    pt here for 10 day follow up for hip pain and xray results    Hip Pain  The incident occurred at home. The injury mechanism was a twisting injury. Pain location: pain has resolved. The patient is experiencing no pain. The pain has been improving since onset. Pertinent negatives include no loss of motion, loss of sensation, numbness or tingling. He has tried heat and NSAIDs for the symptoms. The treatment provided significant relief.  left-sided low back and hip pain, now resolved with muscle relaxant and NSAID therapy. X-rays of left hip showed arthritis in the lumbar spine and both hips. Reviewed with in detail.  Past Medical History  Diagnosis Date  . Hyperlipidemia   . AAA (abdominal aortic aneurysm) ([place])   . Hypertension   . Glaucoma     No past surgical history on file.  Family History  Problem Relation Age of Onset  . Diabetes Father   . Diabetes Maternal Grandmother     Social History   Social History  . Marital Status: Married    Spouse Name: N/A  . Number of Children: N/A  . Years of Education: N/A   Occupational History  . Not on file.   Social History Main Topics  . Smoking status: Former Smoker -- 34 years    Types: Cigarettes    Start date: 08/02/1945    Quit date: 05/26/1980  . Smokeless tobacco: Never Used  . Alcohol Use: No  . Drug Use: No  . Sexual Activity: Not Currently   Other Topics Concern  . Not on file   Social History Narrative    Current outpatient prescriptions:  .  Alcohol Swabs (ALCOHOL PREP) PADS, , Disp: , Rfl:  .  alendronate (FOSAMAX) 70 MG tablet, take 1 tablet by mouth every week, Disp: 12 tablet, Rfl: 3 .  aspirin 81 MG tablet, Take 81 mg by mouth daily. , Disp: , Rfl:  .  cholecalciferol (VITAMIN D) 1000 UNITS tablet, Take 1,000 Units by  mouth daily. , Disp: , Rfl:  .  latanoprost (XALATAN) 0.005 % ophthalmic solution, Apply to eye., Disp: , Rfl:  .  naproxen (NAPROSYN) 250 MG tablet, Take 1 tablet (250 mg total) by mouth 2 (two) times daily with a meal., Disp: 20 tablet, Rfl: 0 .  ONE TOUCH ULTRA TEST test strip, TEST BLOOD SUGAR ONCE A DAY, Disp: 30 each, Rfl: 11 .  simvastatin (ZOCOR) 10 MG tablet, Take 1 tablet (10 mg total) by mouth daily at 6 PM., Disp: , Rfl:  .  timolol (TIMOPTIC) 0.5 % ophthalmic solution, , Disp: , Rfl:  .  tiZANidine (ZANAFLEX) 2 MG tablet, Take 1 tablet (2 mg total) by mouth every 8 (eight) hours as needed for muscle spasms., Disp: 30 tablet, Rfl: 0  No Known Allergies   Review of Systems  Constitutional: Negative for fever, chills and weight loss.  Neurological: Negative for tingling and numbness.     Objective  Filed Vitals:   08/27/15 1622  BP: 138/68  Pulse: 88  Temp: 97.5 F (36.4 C)  Resp: 16  Height: 5\' 7"  (1.702 m)  Weight: 180 lb 3 oz (81.733 kg)  SpO2: 96%    Physical Exam  Constitutional: He is oriented to person,  place, and time and well-developed, well-nourished, and in no distress.  Cardiovascular: Normal rate and regular rhythm.   Pulmonary/Chest: Effort normal and breath sounds normal.  Neurological: He is alert and oriented to person, place, and time.  Nursing note and vitals reviewed.    Assessment & Plan  1. Arthritis of left hip Advised to slowly increase back to his previous level of activity. May take OTC NSAIDs as needed.     Kani Jobson Asad A. [facility] 08/27/2015 4:36 PM

## 2015-11-25 ENCOUNTER — Encounter: Payer: Self-pay | Admitting: Family Medicine

## 2015-11-25 ENCOUNTER — Ambulatory Visit (INDEPENDENT_AMBULATORY_CARE_PROVIDER_SITE_OTHER): Payer: PPO | Admitting: Family Medicine

## 2015-11-25 VITALS — BP 136/71 | HR 94 | Temp 97.5°F | Resp 18 | Ht 67.0 in | Wt 178.5 lb

## 2015-11-25 DIAGNOSIS — M810 Age-related osteoporosis without current pathological fracture: Secondary | ICD-10-CM | POA: Diagnosis not present

## 2015-11-25 NOTE — Progress Notes (Signed)
Name: Roy Hernandez   MRN: [id number]    DOB: 1928-02-25   Date:11/25/2015       Progress Note  Subjective  Chief Complaint  Chief Complaint  Patient presents with  . Follow-up    3 mo    HPI  Pt. Is here for follow up of Osteoporosis, diagnosed in Left Femur neck in December 2015.  He has been on Alendronate 70 mg every week along with Calcium and Vitamin D. Denies any back or hip pain at present,  Past Medical History  Diagnosis Date  . Hyperlipidemia   . AAA (abdominal aortic aneurysm) ([place])   . Hypertension   . Glaucoma     History reviewed. No pertinent past surgical history.  Family History  Problem Relation Age of Onset  . Diabetes Father   . Diabetes Maternal Grandmother     Social History   Social History  . Marital Status: Married    Spouse Name: N/A  . Number of Children: N/A  . Years of Education: N/A   Occupational History  . Not on file.   Social History Main Topics  . Smoking status: Former Smoker -- 34 years    Types: Cigarettes    Start date: 08/02/1945    Quit date: 05/26/1980  . Smokeless tobacco: Never Used  . Alcohol Use: No  . Drug Use: No  . Sexual Activity: Not Currently   Other Topics Concern  . Not on file   Social History Narrative     Current outpatient prescriptions:  .  Alcohol Swabs (ALCOHOL PREP) PADS, , Disp: , Rfl:  .  alendronate (FOSAMAX) 70 MG tablet, take 1 tablet by mouth every week, Disp: 12 tablet, Rfl: 3 .  aspirin 81 MG tablet, Take 81 mg by mouth daily. , Disp: , Rfl:  .  cholecalciferol (VITAMIN D) 1000 UNITS tablet, Take 1,000 Units by mouth daily. , Disp: , Rfl:  .  latanoprost (XALATAN) 0.005 % ophthalmic solution, Apply to eye., Disp: , Rfl:  .  naproxen (NAPROSYN) 250 MG tablet, Take 1 tablet (250 mg total) by mouth 2 (two) times daily with a meal., Disp: 20 tablet, Rfl: 0 .  ONE TOUCH ULTRA TEST test strip, TEST BLOOD SUGAR ONCE A DAY, Disp: 30 each, Rfl: 11 .  simvastatin (ZOCOR) 10 MG tablet, Take  1 tablet (10 mg total) by mouth daily at 6 PM., Disp: , Rfl:  .  timolol (TIMOPTIC) 0.5 % ophthalmic solution, , Disp: , Rfl:  .  tiZANidine (ZANAFLEX) 2 MG tablet, Take 1 tablet (2 mg total) by mouth every 8 (eight) hours as needed for muscle spasms., Disp: 30 tablet, Rfl: 0  No Known Allergies   Review of Systems  Musculoskeletal: Positive for back pain. Negative for joint pain.    Objective  Filed Vitals:   11/25/15 1538  BP: 136/71  Pulse: 94  Temp: 97.5 F (36.4 C)  TempSrc: Oral  Resp: 18  Height: 5\' 7"  (1.702 m)  Weight: 178 lb 8 oz (80.967 kg)  SpO2: 96%    Physical Exam  Constitutional: He is oriented to person, place, and time and well-developed, well-nourished, and in no distress.  HENT:  Head: Normocephalic and atraumatic.  Cardiovascular: Normal rate and regular rhythm.   Pulmonary/Chest: Effort normal and breath sounds normal.  Neurological: He is alert and oriented to person, place, and time.  Nursing note and vitals reviewed.    Assessment & Plan  1. Osteoporosis We will repeat DEXA scan  for assessment of bone density and osteoporosis in the left femur. - DG Bone Density; Future   Roy Hernandez Asad A. [facility] 11/25/2015 4:00 PM

## 2016-01-08 ENCOUNTER — Ambulatory Visit (INDEPENDENT_AMBULATORY_CARE_PROVIDER_SITE_OTHER): Payer: PPO | Admitting: Family Medicine

## 2016-01-08 ENCOUNTER — Encounter: Payer: Self-pay | Admitting: Family Medicine

## 2016-01-08 VITALS — BP 128/70 | HR 78 | Temp 97.4°F | Resp 15 | Ht 67.0 in | Wt 177.1 lb

## 2016-01-08 DIAGNOSIS — E785 Hyperlipidemia, unspecified: Secondary | ICD-10-CM

## 2016-01-08 DIAGNOSIS — E875 Hyperkalemia: Secondary | ICD-10-CM | POA: Diagnosis not present

## 2016-01-08 DIAGNOSIS — E870 Hyperosmolality and hypernatremia: Secondary | ICD-10-CM | POA: Diagnosis not present

## 2016-01-08 NOTE — Progress Notes (Signed)
Name: Roy Hernandez   MRN: [id number]    DOB: Jan 19, 1928   Date:01/08/2016       Progress Note  Subjective  Chief Complaint  Chief Complaint  Patient presents with  . Follow-up    6 mo    Hyperlipidemia This is a chronic problem. The problem is controlled. Recent lipid tests were reviewed and are normal. Pertinent negatives include no leg pain, myalgias or shortness of breath. Current antihyperlipidemic treatment includes statins.    Past Medical History  Diagnosis Date  . Hyperlipidemia   . AAA (abdominal aortic aneurysm) ([place])   . Hypertension   . Glaucoma     History reviewed. No pertinent past surgical history.  Family History  Problem Relation Age of Onset  . Diabetes Father   . Diabetes Maternal Grandmother     Social History   Social History  . Marital Status: Married    Spouse Name: N/A  . Number of Children: N/A  . Years of Education: N/A   Occupational History  . Not on file.   Social History Main Topics  . Smoking status: Former Smoker -- 34 years    Types: Cigarettes    Start date: 08/02/1945    Quit date: 05/26/1980  . Smokeless tobacco: Never Used  . Alcohol Use: No  . Drug Use: No  . Sexual Activity: Not Currently   Other Topics Concern  . Not on file   Social History Narrative     Current outpatient prescriptions:  .  Alcohol Swabs (ALCOHOL PREP) PADS, , Disp: , Rfl:  .  alendronate (FOSAMAX) 70 MG tablet, take 1 tablet by mouth every week, Disp: 12 tablet, Rfl: 3 .  aspirin 81 MG tablet, Take 81 mg by mouth daily. , Disp: , Rfl:  .  cholecalciferol (VITAMIN D) 1000 UNITS tablet, Take 1,000 Units by mouth daily. , Disp: , Rfl:  .  latanoprost (XALATAN) 0.005 % ophthalmic solution, Apply to eye., Disp: , Rfl:  .  naproxen (NAPROSYN) 250 MG tablet, Take 1 tablet (250 mg total) by mouth 2 (two) times daily with a meal., Disp: 20 tablet, Rfl: 0 .  ONE TOUCH ULTRA TEST test strip, TEST BLOOD SUGAR ONCE A DAY, Disp: 30 each, Rfl: 11 .   simvastatin (ZOCOR) 10 MG tablet, Take 1 tablet (10 mg total) by mouth daily at 6 PM., Disp: , Rfl:  .  timolol (TIMOPTIC) 0.5 % ophthalmic solution, , Disp: , Rfl:  .  tiZANidine (ZANAFLEX) 2 MG tablet, Take 1 tablet (2 mg total) by mouth every 8 (eight) hours as needed for muscle spasms., Disp: 30 tablet, Rfl: 0  No Known Allergies   Review of Systems  Respiratory: Negative for shortness of breath.   Musculoskeletal: Negative for myalgias.    Objective  Filed Vitals:   01/08/16 1427  BP: 128/70  Pulse: 78  Temp: 97.4 F (36.3 C)  TempSrc: Oral  Resp: 15  Height: 5\' 7"  (1.702 m)  Weight: 177 lb 1.6 oz (80.332 kg)  SpO2: 93%    Physical Exam  Constitutional: He is oriented to person, place, and time and well-developed, well-nourished, and in no distress.  HENT:  Head: Normocephalic and atraumatic.  Cardiovascular: Normal rate, regular rhythm and normal heart sounds.   No murmur heard. Pulmonary/Chest: Effort normal and breath sounds normal. He has no wheezes.  Abdominal: Soft. Bowel sounds are normal. There is no tenderness.  Neurological: He is alert and oriented to person, place, and time.  Nursing  note and vitals reviewed.      Assessment & Plan  1. Hyperlipidemia We will obtain FLP, continue on statin therapy - Lipid Profile  2. Hypernatremia Recheck sodium levels with BMP - Basic Metabolic Panel (BMET)  3. Hyperkalemia Recheck potassium levels with BMP - Basic Metabolic Panel (BMET)    Crimson Dubberly Asad A. [facility] 01/08/2016 2:41 PM

## 2016-01-09 ENCOUNTER — Ambulatory Visit: Payer: Commercial Managed Care - HMO | Admitting: Family Medicine

## 2016-01-09 LAB — LIPID PANEL
CHOL/HDL RATIO: 3.2 ratio (ref 0.0–5.0)
Cholesterol, Total: 107 mg/dL (ref 100–199)
HDL: 33 mg/dL — ABNORMAL LOW (ref >39)
LDL Calculated: 56 mg/dL (ref 0–99)
Triglycerides: 88 mg/dL (ref 0–149)
VLDL Cholesterol Cal: 18 mg/dL (ref 5–40)

## 2016-01-09 LAB — BASIC METABOLIC PANEL
BUN / CREAT RATIO: 13 (ref 10–24)
BUN: 10 mg/dL (ref 8–27)
CO2: 26 mmol/L (ref 18–29)
Calcium: 8.9 mg/dL (ref 8.6–10.2)
Chloride: 101 mmol/L (ref 96–106)
Creatinine, Ser: 0.77 mg/dL (ref 0.76–1.27)
GFR, EST AFRICAN AMERICAN: 95 mL/min/{1.73_m2} (ref >59)
GFR, EST NON AFRICAN AMERICAN: 82 mL/min/{1.73_m2} (ref >59)
Glucose: 95 mg/dL (ref 65–99)
POTASSIUM: 4.4 mmol/L (ref 3.5–5.2)
SODIUM: 142 mmol/L (ref 134–144)

## 2016-02-06 ENCOUNTER — Telehealth: Payer: Self-pay | Admitting: Family Medicine

## 2016-02-06 DIAGNOSIS — E785 Hyperlipidemia, unspecified: Secondary | ICD-10-CM

## 2016-02-06 MED ORDER — SIMVASTATIN 10 MG PO TABS: 10.0000 mg | ORAL_TABLET | Freq: Every day | ORAL | Status: DC

## 2016-02-06 NOTE — Telephone Encounter (Signed)
Requesting refill on Simvastatin. Please send to [facility] [address]. His current refill has expired.

## 2016-02-06 NOTE — Telephone Encounter (Signed)
Refill for simvastatin is sent to [facility]

## 2016-02-06 NOTE — Telephone Encounter (Signed)
Patient informed. 

## 2016-04-16 ENCOUNTER — Other Ambulatory Visit: Payer: Self-pay | Admitting: Family Medicine

## 2016-04-16 DIAGNOSIS — E785 Hyperlipidemia, unspecified: Secondary | ICD-10-CM

## 2016-07-05 DIAGNOSIS — D039 Melanoma in situ, unspecified: Secondary | ICD-10-CM

## 2016-07-05 HISTORY — DX: Melanoma in situ, unspecified: D03.9

## 2016-07-07 ENCOUNTER — Other Ambulatory Visit (INDEPENDENT_AMBULATORY_CARE_PROVIDER_SITE_OTHER): Payer: Self-pay | Admitting: Vascular Surgery

## 2016-07-07 DIAGNOSIS — M79661 Pain in right lower leg: Secondary | ICD-10-CM

## 2016-07-07 DIAGNOSIS — I739 Peripheral vascular disease, unspecified: Secondary | ICD-10-CM

## 2016-07-07 DIAGNOSIS — I724 Aneurysm of artery of lower extremity: Secondary | ICD-10-CM

## 2016-07-07 DIAGNOSIS — I714 Abdominal aortic aneurysm, without rupture, unspecified: Secondary | ICD-10-CM

## 2016-07-07 DIAGNOSIS — M79662 Pain in left lower leg: Secondary | ICD-10-CM

## 2016-07-09 ENCOUNTER — Ambulatory Visit (INDEPENDENT_AMBULATORY_CARE_PROVIDER_SITE_OTHER): Payer: PPO | Admitting: Family Medicine

## 2016-07-09 ENCOUNTER — Encounter: Payer: Self-pay | Admitting: Family Medicine

## 2016-07-09 VITALS — BP 126/79 | HR 78 | Temp 98.1°F | Resp 16 | Ht 67.0 in | Wt 175.8 lb

## 2016-07-09 DIAGNOSIS — M810 Age-related osteoporosis without current pathological fracture: Secondary | ICD-10-CM | POA: Diagnosis not present

## 2016-07-09 DIAGNOSIS — E785 Hyperlipidemia, unspecified: Secondary | ICD-10-CM | POA: Diagnosis not present

## 2016-07-09 MED ORDER — ALENDRONATE SODIUM 70 MG PO TABS: 70.0000 mg | ORAL_TABLET | ORAL | 3 refills | Status: DC

## 2016-07-09 NOTE — Progress Notes (Signed)
Name: Roy Hernandez   MRN: [id number]    DOB: 03-13-1928   Date:07/09/2016       Progress Note  Subjective  Chief Complaint  Chief Complaint  Patient presents with  . Follow-up    6 mo  . Medication Refill    fosamax    Hyperlipidemia  This is a chronic problem. The problem is controlled. Recent lipid tests were reviewed and are normal. Pertinent negatives include no leg pain, myalgias or shortness of breath. Current antihyperlipidemic treatment includes statins.    Pt. Presents for follow up on Osteoporosis, last DEXA scan showed Osteoporosis at left femur neck with T-score of -2.6, he is taking Fosamax 70 mg every week, here to repeat DEXA Scan.    Past Medical History:  Diagnosis Date  . AAA (abdominal aortic aneurysm) ([place])   . Glaucoma   . Hyperlipidemia   . Hypertension     History reviewed. No pertinent surgical history.  Family History  Problem Relation Age of Onset  . Diabetes Father   . Diabetes Maternal Grandmother     Social History   Social History  . Marital status: Married    Spouse name: N/A  . Number of children: N/A  . Years of education: N/A   Occupational History  . Not on file.   Social History Main Topics  . Smoking status: Former Smoker    Years: 34.00    Types: Cigarettes    Start date: 08/02/1945    Quit date: 05/26/1980  . Smokeless tobacco: Never Used  . Alcohol use No  . Drug use: No  . Sexual activity: Not Currently   Other Topics Concern  . Not on file   Social History Narrative  . No narrative on file     Current Outpatient Prescriptions:  .  Alcohol Swabs (ALCOHOL PREP) PADS, , Disp: , Rfl:  .  alendronate (FOSAMAX) 70 MG tablet, take 1 tablet by mouth every week, Disp: 12 tablet, Rfl: 3 .  aspirin 81 MG tablet, Take 81 mg by mouth daily. , Disp: , Rfl:  .  cholecalciferol (VITAMIN D) 1000 UNITS tablet, Take 1,000 Units by mouth daily. , Disp: , Rfl:  .  latanoprost (XALATAN) 0.005 % ophthalmic solution, Apply to  eye., Disp: , Rfl:  .  ONE TOUCH ULTRA TEST test strip, TEST BLOOD SUGAR ONCE A DAY, Disp: 30 each, Rfl: 11 .  simvastatin (ZOCOR) 10 MG tablet, TAKE ONE TABLET BY MOUTH ONCE DAILY AT  6PM, Disp: 90 tablet, Rfl: 0 .  timolol (TIMOPTIC) 0.5 % ophthalmic solution, , Disp: , Rfl:   No Known Allergies   Review of Systems  Respiratory: Negative for shortness of breath.   Musculoskeletal: Negative for myalgias.     Objective  Vitals:   07/09/16 1046  BP: 126/79  Pulse: 78  Resp: 16  Temp: 98.1 F (36.7 C)  TempSrc: Oral  SpO2: 96%  Weight: 175 lb 12.8 oz (79.7 kg)  Height: 5\' 7"  (1.702 m)    Physical Exam  Constitutional: He is oriented to person, place, and time and well-developed, well-nourished, and in no distress.  HENT:  Head: Normocephalic and atraumatic.  Cardiovascular: Normal rate, regular rhythm and normal heart sounds.   No murmur heard. Pulmonary/Chest: Effort normal and breath sounds normal. He has no wheezes.  Abdominal: Soft. Bowel sounds are normal. There is no tenderness.  Neurological: He is alert and oriented to person, place, and time.  Psychiatric: Mood, memory, affect and judgment  normal.  Nursing note and vitals reviewed.     Assessment & Plan  1. Osteoporosis of femur without pathological fracture Obtain vitamin D and calcium levels, check DEXA scan for follow-up. - DG Bone Density; Future - Vitamin D (25 hydroxy) - alendronate (FOSAMAX) 70 MG tablet; Take 1 tablet (70 mg total) by mouth once a week. Take with a full glass of water on an empty stomach.  Dispense: 12 tablet; Refill: 3  2. Dyslipidemia  - Lipid Profile - COMPLETE METABOLIC PANEL WITH GFR   Flecia Shutter Asad A. [facility] 07/09/2016 10:54 AM

## 2016-07-12 ENCOUNTER — Other Ambulatory Visit: Payer: Self-pay | Admitting: Family Medicine

## 2016-07-12 ENCOUNTER — Encounter (INDEPENDENT_AMBULATORY_CARE_PROVIDER_SITE_OTHER): Payer: PPO

## 2016-07-12 ENCOUNTER — Ambulatory Visit (INDEPENDENT_AMBULATORY_CARE_PROVIDER_SITE_OTHER): Payer: Self-pay | Admitting: Vascular Surgery

## 2016-07-12 ENCOUNTER — Other Ambulatory Visit (INDEPENDENT_AMBULATORY_CARE_PROVIDER_SITE_OTHER): Payer: PPO

## 2016-07-12 DIAGNOSIS — E785 Hyperlipidemia, unspecified: Secondary | ICD-10-CM

## 2016-07-12 NOTE — Telephone Encounter (Signed)
Medication has been refilled and sent to [facility] [address] 

## 2016-07-13 ENCOUNTER — Other Ambulatory Visit: Payer: Self-pay | Admitting: Family Medicine

## 2016-07-13 NOTE — Telephone Encounter (Signed)
Patient is requesting refill on Alendronate. I informed patient that prescription had been sent to [facility]. Pt then states that it was sent to wrong pharmacy that it should have been sent to [facility]. Please resend script to correct pharmacy.

## 2016-07-15 ENCOUNTER — Other Ambulatory Visit: Payer: Self-pay | Admitting: Family Medicine

## 2016-08-04 ENCOUNTER — Encounter: Payer: Self-pay | Admitting: Family Medicine

## 2016-08-04 ENCOUNTER — Ambulatory Visit (INDEPENDENT_AMBULATORY_CARE_PROVIDER_SITE_OTHER): Payer: Medicare HMO | Admitting: Family Medicine

## 2016-08-04 DIAGNOSIS — J069 Acute upper respiratory infection, unspecified: Secondary | ICD-10-CM | POA: Diagnosis not present

## 2016-08-04 MED ORDER — BENZONATATE 200 MG PO CAPS: 200.0000 mg | ORAL_CAPSULE | Freq: Three times a day (TID) | ORAL | 0 refills | Status: DC | PRN

## 2016-08-04 NOTE — Progress Notes (Signed)
Name: Roy Hernandez   MRN: [id number]    DOB: [birth year]   Date:08/04/2016       Progress Note  Subjective  Chief Complaint  Chief Complaint  Patient presents with  . URI    cough with yellow phelgm, congested    Cough  This is a new problem. Episode onset: frequent congestion from sinus inflammation, now turning into productive cough. The problem has been unchanged. The cough is productive of sputum. Associated symptoms include nasal congestion. Pertinent negatives include no fever, headaches, sore throat or wheezing. There is no history of bronchitis, COPD or pneumonia.    Past Medical History:  Diagnosis Date  . AAA (abdominal aortic aneurysm) ([place])   . Glaucoma   . Hyperlipidemia   . Hypertension     No past surgical history on file.  Family History  Problem Relation Age of Onset  . Diabetes Father   . Diabetes Maternal Grandmother     Social History   Social History  . Marital status: Married    Spouse name: N/A  . Number of children: N/A  . Years of education: N/A   Occupational History  . Not on file.   Social History Main Topics  . Smoking status: Former Smoker    Years: 34.00    Types: Cigarettes    Start date: 08/02/1945    Quit date: 05/26/1980  . Smokeless tobacco: Never Used  . Alcohol use No  . Drug use: No  . Sexual activity: Not Currently   Other Topics Concern  . Not on file   Social History Narrative  . No narrative on file     Current Outpatient Prescriptions:  .  Alcohol Swabs (ALCOHOL PREP) PADS, , Disp: , Rfl:  .  alendronate (FOSAMAX) 70 MG tablet, Take 1 tablet (70 mg total) by mouth once a week. Take with a full glass of water on an empty stomach., Disp: 12 tablet, Rfl: 3 .  alendronate (FOSAMAX) 70 MG tablet, take 1 tablet by mouth every week, Disp: 12 tablet, Rfl: 3 .  aspirin 81 MG tablet, Take 81 mg by mouth daily. , Disp: , Rfl:  .  cholecalciferol (VITAMIN D) 1000 UNITS tablet, Take 1,000 Units by mouth daily. , Disp: , Rfl:   .  latanoprost (XALATAN) 0.005 % ophthalmic solution, Apply to eye., Disp: , Rfl:  .  ONE TOUCH ULTRA TEST test strip, TEST BLOOD GLUCOSE LEVEL ONCE A DAY., Disp: 90 each, Rfl: 1 .  simvastatin (ZOCOR) 10 MG tablet, TAKE ONE TABLET BY MOUTH ONCE DAILY AT  6PM, Disp: 90 tablet, Rfl: 0 .  timolol (TIMOPTIC) 0.5 % ophthalmic solution, , Disp: , Rfl:   No Known Allergies   Review of Systems  Constitutional: Negative for fever.  HENT: Negative for sore throat.   Respiratory: Positive for cough. Negative for wheezing.   Neurological: Negative for headaches.    Objective  Vitals:   08/04/16 1348  BP: 110/78  Pulse: 90  Resp: 16  Temp: 97.9 F (36.6 C)  TempSrc: Oral  SpO2: 93%  Weight: 178 lb 3.2 oz (80.8 kg)  Height: 5\' 7"  (1.702 m)    Physical Exam  Constitutional: He is oriented to person, place, and time and well-developed, well-nourished, and in no distress.  HENT:  Head: Normocephalic and atraumatic.  Nose: Right sinus exhibits no maxillary sinus tenderness and no frontal sinus tenderness. Left sinus exhibits no maxillary sinus tenderness and no frontal sinus tenderness.  Mouth/Throat: Posterior oropharyngeal erythema  present. No oropharyngeal exudate or posterior oropharyngeal edema.  Cardiovascular: Normal rate, regular rhythm and normal heart sounds.   No murmur heard. Pulmonary/Chest: Effort normal and breath sounds normal. He has no wheezes.  Neurological: He is alert and oriented to person, place, and time.  Skin: Skin is warm, dry and intact.  Nursing note and vitals reviewed.       Assessment & Plan  1. URI with cough and congestion Appears to be resolving, antibiotics not indicated. Started on [employer] for relief of coughing - benzonatate (TESSALON) 200 MG capsule; Take 1 capsule (200 mg total) by mouth 3 (three) times daily as needed for cough.  Dispense: 20 capsule; Refill: 0   Anjanae Woehrle Asad A. [facility] 08/04/2016 1:57 PM

## 2016-09-21 ENCOUNTER — Ambulatory Visit (INDEPENDENT_AMBULATORY_CARE_PROVIDER_SITE_OTHER): Payer: Self-pay | Admitting: Vascular Surgery

## 2016-10-04 ENCOUNTER — Ambulatory Visit (INDEPENDENT_AMBULATORY_CARE_PROVIDER_SITE_OTHER): Payer: Medicare HMO

## 2016-10-04 ENCOUNTER — Ambulatory Visit (INDEPENDENT_AMBULATORY_CARE_PROVIDER_SITE_OTHER): Payer: Medicare HMO | Admitting: Family Medicine

## 2016-10-04 VITALS — BP 110/60 | HR 84 | Temp 97.0°F | Ht 67.0 in | Wt 176.4 lb

## 2016-10-04 DIAGNOSIS — E785 Hyperlipidemia, unspecified: Secondary | ICD-10-CM

## 2016-10-04 DIAGNOSIS — Z0000 Encounter for general adult medical examination without abnormal findings: Secondary | ICD-10-CM

## 2016-10-04 DIAGNOSIS — M810 Age-related osteoporosis without current pathological fracture: Secondary | ICD-10-CM | POA: Diagnosis not present

## 2016-10-04 DIAGNOSIS — E559 Vitamin D deficiency, unspecified: Secondary | ICD-10-CM | POA: Diagnosis not present

## 2016-10-04 LAB — CBC WITH DIFFERENTIAL/PLATELET
BASOS ABS: 0 {cells}/uL (ref 0–200)
BASOS PCT: 0 %
EOS ABS: 112 {cells}/uL (ref 15–500)
EOS PCT: 2 %
HCT: 42.8 % (ref 38.5–50.0)
HEMOGLOBIN: 14.3 g/dL (ref 13.2–17.1)
Lymphocytes Relative: 23 %
Lymphs Abs: 1288 cells/uL (ref 850–3900)
MCH: 31.6 pg (ref 27.0–33.0)
MCHC: 33.4 g/dL (ref 32.0–36.0)
MCV: 94.7 fL (ref 80.0–100.0)
MPV: 10.2 fL (ref 7.5–12.5)
Monocytes Absolute: 448 cells/uL (ref 200–950)
Monocytes Relative: 8 %
NEUTROS ABS: 3752 {cells}/uL (ref 1500–7800)
Neutrophils Relative %: 67 %
PLATELETS: 192 10*3/uL (ref 140–400)
RBC: 4.52 MIL/uL (ref 4.20–5.80)
RDW: 12.9 % (ref 11.0–15.0)
WBC: 5.6 10*3/uL (ref 3.8–10.8)

## 2016-10-04 LAB — TSH: TSH: 1.48 m[IU]/L (ref 0.40–4.50)

## 2016-10-04 LAB — LIPID PANEL
Cholesterol: 105 mg/dL (ref <200)
HDL: 39 mg/dL — ABNORMAL LOW (ref >40)
LDL CALC: 57 mg/dL (ref <100)
Total CHOL/HDL Ratio: 2.7 Ratio (ref <5.0)
Triglycerides: 47 mg/dL (ref <150)
VLDL: 9 mg/dL (ref <30)

## 2016-10-04 NOTE — Progress Notes (Signed)
Name: Roy Hernandez   MRN: [id number]    DOB: [birth year]   Date:10/04/2016       Progress Note  Subjective  Chief Complaint  No chief complaint on file.   HPI  Pt. Presents for part 2 of Medicare Physical.  He is doing well.   Past Medical History:  Diagnosis Date  . AAA (abdominal aortic aneurysm) ([place])   . Glaucoma   . Hyperlipidemia   . Hypertension     No past surgical history on file.  Family History  Problem Relation Age of Onset  . Diabetes Father   . Diabetes Maternal Grandmother   . Diabetes Brother   . Kidney disease Brother     on dialysis    Social History   Social History  . Marital status: Married    Spouse name: N/A  . Number of children: N/A  . Years of education: N/A   Occupational History  . Not on file.   Social History Main Topics  . Smoking status: Former Smoker    Years: 34.00    Types: Cigarettes    Start date: 08/02/1945    Quit date: 05/26/1980  . Smokeless tobacco: Never Used  . Alcohol use No  . Drug use: No  . Sexual activity: Not Currently   Other Topics Concern  . Not on file   Social History Narrative  . No narrative on file     Current Outpatient Prescriptions:  .  pseudoephedrine-acetaminophen (TYLENOL SINUS) 30-500 MG TABS tablet, Take 1 tablet by mouth every 4 (four) hours as needed., Disp: , Rfl:  .  Alcohol Swabs (ALCOHOL PREP) PADS, , Disp: , Rfl:  .  alendronate (FOSAMAX) 70 MG tablet, Take 1 tablet (70 mg total) by mouth once a week. Take with a full glass of water on an empty stomach., Disp: 12 tablet, Rfl: 3 .  aspirin 81 MG tablet, Take 81 mg by mouth daily. , Disp: , Rfl:  .  cholecalciferol (VITAMIN D) 1000 UNITS tablet, Take 1,000 Units by mouth daily. , Disp: , Rfl:  .  latanoprost (XALATAN) 0.005 % ophthalmic solution, Apply to eye., Disp: , Rfl:  .  ONE TOUCH ULTRA TEST test strip, TEST BLOOD GLUCOSE LEVEL ONCE A DAY., Disp: 90 each, Rfl: 1 .  simvastatin (ZOCOR) 10 MG tablet, TAKE ONE TABLET BY MOUTH  ONCE DAILY AT  6PM, Disp: 90 tablet, Rfl: 0 .  timolol (TIMOPTIC) 0.5 % ophthalmic solution, , Disp: , Rfl:   No Known Allergies   Review of Systems  Constitutional: Negative for chills, fever, malaise/fatigue and weight loss.  HENT: Negative for congestion, ear pain, sinus pain and sore throat.   Eyes: Negative for blurred vision and double vision.  Respiratory: Negative for cough and shortness of breath.   Cardiovascular: Negative for chest pain and leg swelling.  Gastrointestinal: Negative for abdominal pain, blood in stool, constipation, diarrhea, nausea and vomiting.  Genitourinary: Negative for dysuria, frequency and hematuria.  Musculoskeletal: Negative for back pain, joint pain and neck pain.  Skin: Negative for itching and rash.  Neurological: Negative for dizziness and headaches.  Psychiatric/Behavioral: Negative for depression. The patient is not nervous/anxious and does not have insomnia.     Objective  There were no vitals filed for this visit.  Physical Exam  Constitutional: He is oriented to person, place, and time and well-developed, well-nourished, and in no distress.  HENT:  Head: Normocephalic and atraumatic.  Eyes: Pupils are equal, round, and reactive to  light.  Cardiovascular: Normal rate, regular rhythm and normal heart sounds.   No murmur heard. Pulmonary/Chest: Effort normal and breath sounds normal. He has no wheezes.  Abdominal: Soft. Bowel sounds are normal. There is no tenderness.  Musculoskeletal: He exhibits no edema or deformity.       Right ankle: He exhibits no swelling.       Left ankle: He exhibits no swelling.  Neurological: He is alert and oriented to person, place, and time.  Psychiatric: Mood, memory, affect and judgment normal.  Nursing note and vitals reviewed.      Assessment & Plan  1. Medicare annual wellness visit, subsequent  - CBC with Differential/Platelet - TSH - VITAMIN D 25 Hydroxy (Vit-D Deficiency,  Fractures)  2. Osteoporosis of femur without pathological fracture  - DG Bone Density; Future  3. Dyslipidemia  - Lipid panel   Tamyia Minich Asad A. [facility] 10/04/2016 1:36 PM

## 2016-10-04 NOTE — Progress Notes (Signed)
Subjective:   Roy Hernandez is a 83 y.o. male who presents for Medicare Annual/Subsequent preventive examination.  Review of Systems:  N/A  Cardiac Risk Factors include: advanced age (>51men, >19 women);dyslipidemia;male gender     Objective:    Vitals: BP 110/60 (BP Location: Right Arm)   Pulse 84   Temp 97 F (36.1 C) (Oral)   Ht 5\' 7"  (1.702 m)   Wt 176 lb 6.4 oz (80 kg)   BMI 27.63 kg/m   Body mass index is 27.63 kg/m.  Tobacco History  Smoking Status  . Former Smoker  . Years: 34.00  . Types: Cigarettes  . Start date: 08/02/1945  . Quit date: 05/26/1980  Smokeless Tobacco  . Never Used     Counseling given: Not Answered   Past Medical History:  Diagnosis Date  . AAA (abdominal aortic aneurysm) ([place])   . Glaucoma   . Hyperlipidemia   . Hypertension    History reviewed. No pertinent surgical history. Family History  Problem Relation Age of Onset  . Diabetes Father   . Diabetes Maternal Grandmother   . Diabetes Brother   . Kidney disease Brother     on dialysis   History  Sexual Activity  . Sexual activity: Not Currently    Outpatient Encounter Prescriptions as of 10/04/2016  Medication Sig  . Alcohol Swabs (ALCOHOL PREP) PADS   . alendronate (FOSAMAX) 70 MG tablet Take 1 tablet (70 mg total) by mouth once a week. Take with a full glass of water on an empty stomach.  [name] aspirin 81 MG tablet Take 81 mg by mouth daily.   . cholecalciferol (VITAMIN D) 1000 UNITS tablet Take 1,000 Units by mouth daily.   [name] latanoprost (XALATAN) 0.005 % ophthalmic solution Apply to eye.  . ONE TOUCH ULTRA TEST test strip TEST BLOOD GLUCOSE LEVEL ONCE A DAY.  . simvastatin (ZOCOR) 10 MG tablet TAKE ONE TABLET BY MOUTH ONCE DAILY AT  6PM  . timolol (TIMOPTIC) 0.5 % ophthalmic solution   . alendronate (FOSAMAX) 70 MG tablet take 1 tablet by mouth every week (Patient not taking: Reported on 10/04/2016)  . benzonatate (TESSALON) 200 MG capsule Take 1 capsule (200 mg total) by  mouth 3 (three) times daily as needed for cough. (Patient not taking: Reported on 10/04/2016)   No facility-administered encounter medications on file as of 10/04/2016.     Activities of Daily Living In your present state of health, do you have any difficulty performing the following activities: 10/04/2016 07/09/2016  Hearing? Y N  Vision? Y Y  Difficulty concentrating or making decisions? Y N  Walking or climbing stairs? N N  Dressing or bathing? N N  Doing errands, shopping? N N  Preparing Food and eating ? N -  Using the Toilet? N -  In the past six months, have you accidently leaked urine? N -  Do you have problems with loss of bowel control? N -  Managing your Medications? N -  Managing your Finances? N -  Housekeeping or managing your Housekeeping? N -  Some recent data might be hidden    Patient Care Team: Roselee Nova, MD as PCP - General (Family Medicine)   Assessment:     Exercise Activities and Dietary recommendations Current Exercise Habits: Home exercise routine, Type of exercise: walking, Time (Minutes): > 60 (1.5 hours), Frequency (Times/Week): 5, Weekly Exercise (Minutes/Week): 0, Intensity: Mild, Exercise limited by: None identified  Goals    . Increase  water intake          Recommend increasing water intake to three 12-16oz bottles of water a day.      Fall Risk Fall Risk  10/04/2016 07/09/2016 01/08/2016 11/25/2015 08/27/2015  Falls in the past year? Yes No No No Yes  Number falls in past yr: 2 or more - - - 2 or more  Injury with Fall? No - - - Yes  Risk for fall due to : - - - - History of fall(s)  Follow up Falls prevention discussed - - - Falls evaluation completed   Depression Screen PHQ 2/9 Scores 10/04/2016 07/09/2016 01/08/2016 11/25/2015  PHQ - 2 Score 0 0 0 0    Cognitive Function     6CIT Screen 10/04/2016  What Year? 0 points  What month? 0 points  What time? 0 points  Count back from 20 0 points  Months in reverse 4 points  Repeat phrase 8  points  Total Score 12    Immunization History  Administered Date(s) Administered  . Influenza, High Dose Seasonal PF 05/02/2014, 04/30/2015  . Influenza, Seasonal, Injecte, Preservative Fre 04/13/2012  . Influenza,inj,Quad PF,36+ Mos 05/03/2013  . Influenza-Unspecified 05/02/2014  . Pneumococcal Conjugate-13 06/13/2014  . Pneumococcal Polysaccharide-23 06/12/2013   Screening Tests Health Maintenance  Topic Date Due  . FOOT EXAM  08/29/1939  . URINE MICROALBUMIN  08/29/1939  . HEMOGLOBIN A1C  01/09/2016  . TETANUS/TDAP  08/02/2017 (Originally [birth year]28/1946)  . OPHTHALMOLOGY EXAM  06/02/2017  . INFLUENZA VACCINE  Completed  . PNA vac Low Risk Adult  Completed      Plan:  I have personally reviewed and addressed the Medicare Annual Wellness questionnaire and have noted the following in the patient's chart:  A. Medical and social history B. Use of alcohol, tobacco or illicit drugs  C. Current medications and supplements D. Functional ability and status E.  Nutritional status F.  Physical activity G. Advance directives H. List of other physicians I.  Hospitalizations, surgeries, and ER visits in previous 12 months J.  [place] such as hearing and vision if needed, cognitive and depression L. Referrals and appointments - none  In addition, I have reviewed and discussed with patient certain preventive protocols, quality metrics, and best practice recommendations. A written personalized care plan for preventive services as well as general preventive health recommendations were provided to patient.  See attached scanned questionnaire for additional information.   Signed,  Fabio Neighbors, LPN Nurse Health Advisor   MD Recommendations: Pt needs diabetic foot exam, Hgb A1c and urine microalbumin today at visit. I, as supervising physician, have reviewed the nurse health advisor's Medicare Wellness Visit note for this patient and concur with the findings and  recommendations listed above.  Signed Syed Asad A. Manuella Ghazi MD Attending Physician.

## 2016-10-04 NOTE — Patient Instructions (Signed)

## 2016-10-05 LAB — VITAMIN D 25 HYDROXY (VIT D DEFICIENCY, FRACTURES): Vit D, 25-Hydroxy: 42 ng/mL (ref 30–100)

## 2016-10-07 ENCOUNTER — Ambulatory Visit: Payer: PPO | Admitting: Family Medicine

## 2016-10-19 ENCOUNTER — Encounter (INDEPENDENT_AMBULATORY_CARE_PROVIDER_SITE_OTHER): Payer: Self-pay | Admitting: Vascular Surgery

## 2016-10-19 ENCOUNTER — Ambulatory Visit (INDEPENDENT_AMBULATORY_CARE_PROVIDER_SITE_OTHER): Payer: Medicare HMO

## 2016-10-19 ENCOUNTER — Ambulatory Visit (INDEPENDENT_AMBULATORY_CARE_PROVIDER_SITE_OTHER): Payer: Medicare HMO | Admitting: Vascular Surgery

## 2016-10-19 VITALS — BP 126/67 | HR 68 | Resp 16 | Ht 65.0 in | Wt 175.0 lb

## 2016-10-19 DIAGNOSIS — M79662 Pain in left lower leg: Secondary | ICD-10-CM

## 2016-10-19 DIAGNOSIS — I714 Abdominal aortic aneurysm, without rupture, unspecified: Secondary | ICD-10-CM

## 2016-10-19 DIAGNOSIS — I739 Peripheral vascular disease, unspecified: Secondary | ICD-10-CM | POA: Diagnosis not present

## 2016-10-19 DIAGNOSIS — M79661 Pain in right lower leg: Secondary | ICD-10-CM | POA: Diagnosis not present

## 2016-10-19 DIAGNOSIS — I724 Aneurysm of artery of lower extremity: Secondary | ICD-10-CM

## 2016-10-19 DIAGNOSIS — E785 Hyperlipidemia, unspecified: Secondary | ICD-10-CM | POA: Diagnosis not present

## 2016-10-19 NOTE — Progress Notes (Signed)
Subjective:    Patient ID: Roy Hernandez, male    DOB: 18-Apr-1928, 83 y.o.   MRN: [id number] Chief Complaint  Patient presents with  . Re-evaluation    6 month ultrasound follow up   Patient presents for a yearly AAA and PAD follow up. He underwent an aortic duplex which was notable for an abdominal aortic aneurysm measuring 3.55cm AP x 3.6cm transverse (previous 3.58cm x 3.6cm on 12/25/15), RCIA: 2.23cm x 2.21cm (previous 2.22cm x 2.22cm on 12/25/15), LCIA.: 1.94cm x 1.93cm (previous 1.94cm x 1.91cm on 12/25/15) He denies any symptoms such as back pain, pulsatile abdominal masses or thrombosis in his extremities. A bilateral lower extremity arterial duplex is notable for triphasic blood flow distally with no hemodynamic significant stenosis. The patient denies any claudication like symptoms, rest pain or ulcers to her feet / toes.    Review of Systems  Constitutional: Negative.   HENT: Negative.   Eyes: Negative.   Respiratory: Negative.   Cardiovascular: Negative.   Gastrointestinal: Negative.   Endocrine: Negative.   Genitourinary: Negative.   Musculoskeletal: Negative.   Skin: Negative.   Allergic/Immunologic: Negative.   Neurological: Negative.   Hematological: Negative.   Psychiatric/Behavioral: Negative.        Objective:   Physical Exam  Constitutional: He is oriented to person, place, and time. He appears well-developed and well-nourished.  HENT:  Head: Normocephalic and atraumatic.  Right Ear: External ear normal.  Left Ear: External ear normal.  Eyes: Conjunctivae and EOM are normal. Pupils are equal, round, and reactive to light.  Neck: Normal range of motion.  Cardiovascular: Normal rate, regular rhythm, normal heart sounds and intact distal pulses.   Pulses:      Radial pulses are 2+ on the right side, and 2+ on the left side.       Dorsalis pedis pulses are 2+ on the right side, and 2+ on the left side.       Posterior tibial pulses are 2+ on the right side, and  2+ on the left side.  Pulmonary/Chest: Effort normal.  Musculoskeletal: Normal range of motion. He exhibits no edema.  Neurological: He is alert and oriented to person, place, and time.  Skin: Skin is warm and dry.  Psychiatric: He has a normal mood and affect. His behavior is normal. Judgment and thought content normal.   BP 126/67 (BP Location: Right Arm)   Pulse 68   Resp 16   Ht 5\' 5"  (1.651 m)   Wt 175 lb (79.4 kg)   BMI 29.12 kg/m   Past Medical History:  Diagnosis Date  . AAA (abdominal aortic aneurysm) ([place])   . Glaucoma   . Hyperlipidemia   . Hypertension    Social History   Social History  . Marital status: Married    Spouse name: N/A  . Number of children: N/A  . Years of education: N/A   Occupational History  . Not on file.   Social History Main Topics  . Smoking status: Former Smoker    Years: 34.00    Types: Cigarettes    Start date: 08/02/1945    Quit date: 05/26/1980  . Smokeless tobacco: Never Used  . Alcohol use No  . Drug use: No  . Sexual activity: Not Currently   Other Topics Concern  . Not on file   Social History Narrative  . No narrative on file   No past surgical history on file.  Family History  Problem Relation Age of Onset  .  Diabetes Father   . Diabetes Maternal Grandmother   . Diabetes Brother   . Kidney disease Brother     on dialysis   No Known Allergies     Assessment & Plan:   1. AAA (abdominal aortic aneurysm) without rupture ([place]) Studies reviewed with patient. Duplex stable, physical exam unremarkable. No surgery or intervention at this time. The patient to follow up in one year with an aortic duplex. The patient has an asymptomatic abdominal aortic aneurysm that is less than 4 cm in maximal diameter.  I have reviewed the natural history of abdominal aortic aneurysm and the small risk of rupture for aneurysm less than 5 cm in size.  However, as these small aneurysms tend to enlarge over time, continued  surveillance with ultrasound or CT scan is mandatory.  The patient's blood pressure is being adequately controlled however I have reviewed the importance of hypertension and lipid control and the importance of continuing his abstinence from tobacco.  The patient is also encouraged to exercise a minimum of 30 minutes 4 times a week.  Should the patient develop new onset abdominal or back pain or signs of peripheral embolization they are instructed to seek medical attention immediately and to alert the physician providing care that they have an aneurysm.  The patient voices their understanding.  - VAS [place] AAA DUPLEX; Future  2. PAD (peripheral artery disease) ([facility]) - Stable Asymptomatic. Stable aneurysms with triphasic blood flow and palpable pedal pulses. No indication for intervention at this time.  Patient to remain abstinent of tobacco use. I have discussed with the patient at length the risk factors for and pathogenesis of atherosclerotic disease and encouraged a healthy diet, regular exercise regimen and blood pressure / glucose control.  The patient was encouraged to call the office in the interim if he experiences any claudication like symptoms, rest pain or ulcers to his feet / toes.  - VAS [place] LOWER EXTREMITY ARTERIAL DUPLEX; Future  3. Dyslipidemia - Stable Encouraged good control as its slows the progression of atherosclerotic disease  Current Outpatient Prescriptions on File Prior to Visit  Medication Sig Dispense Refill  . Alcohol Swabs (ALCOHOL PREP) PADS     . alendronate (FOSAMAX) 70 MG tablet Take 1 tablet (70 mg total) by mouth once a week. Take with a full glass of water on an empty stomach. 12 tablet 3  . aspirin 81 MG tablet Take 81 mg by mouth daily.     . cholecalciferol (VITAMIN D) 1000 UNITS tablet Take 1,000 Units by mouth daily.     [name] latanoprost (XALATAN) 0.005 % ophthalmic solution Apply to eye.    . ONE TOUCH ULTRA TEST test strip TEST BLOOD GLUCOSE LEVEL ONCE A  DAY. 90 each 1  . pseudoephedrine-acetaminophen (TYLENOL SINUS) 30-500 MG TABS tablet Take 1 tablet by mouth every 4 (four) hours as needed.    . simvastatin (ZOCOR) 10 MG tablet TAKE ONE TABLET BY MOUTH ONCE DAILY AT  6PM 90 tablet 0  . timolol (TIMOPTIC) 0.5 % ophthalmic solution      No current facility-administered medications on file prior to visit.     There are no Patient Instructions on file for this visit. No Follow-up on file.   KIMBERLY A STEGMAYER, PA-C

## 2016-11-04 DIAGNOSIS — Z85820 Personal history of malignant melanoma of skin: Secondary | ICD-10-CM | POA: Diagnosis not present

## 2016-11-04 DIAGNOSIS — S30860A Insect bite (nonvenomous) of lower back and pelvis, initial encounter: Secondary | ICD-10-CM | POA: Diagnosis not present

## 2016-11-04 DIAGNOSIS — L821 Other seborrheic keratosis: Secondary | ICD-10-CM | POA: Diagnosis not present

## 2016-11-04 DIAGNOSIS — D692 Other nonthrombocytopenic purpura: Secondary | ICD-10-CM | POA: Diagnosis not present

## 2016-11-08 ENCOUNTER — Other Ambulatory Visit: Payer: Self-pay | Admitting: Family Medicine

## 2016-11-08 DIAGNOSIS — E785 Hyperlipidemia, unspecified: Secondary | ICD-10-CM

## 2016-12-14 DIAGNOSIS — H34831 Tributary (branch) retinal vein occlusion, right eye, with macular edema: Secondary | ICD-10-CM | POA: Diagnosis not present

## 2017-01-10 DIAGNOSIS — D1800 Hemangioma unspecified site: Secondary | ICD-10-CM | POA: Diagnosis not present

## 2017-01-10 DIAGNOSIS — L821 Other seborrheic keratosis: Secondary | ICD-10-CM | POA: Diagnosis not present

## 2017-01-10 DIAGNOSIS — Z85828 Personal history of other malignant neoplasm of skin: Secondary | ICD-10-CM | POA: Diagnosis not present

## 2017-01-10 DIAGNOSIS — D692 Other nonthrombocytopenic purpura: Secondary | ICD-10-CM | POA: Diagnosis not present

## 2017-01-10 DIAGNOSIS — L570 Actinic keratosis: Secondary | ICD-10-CM | POA: Diagnosis not present

## 2017-01-10 DIAGNOSIS — L700 Acne vulgaris: Secondary | ICD-10-CM | POA: Diagnosis not present

## 2017-01-10 DIAGNOSIS — Z85820 Personal history of malignant melanoma of skin: Secondary | ICD-10-CM | POA: Diagnosis not present

## 2017-01-24 ENCOUNTER — Other Ambulatory Visit: Payer: Self-pay | Admitting: Family Medicine

## 2017-01-24 DIAGNOSIS — E785 Hyperlipidemia, unspecified: Secondary | ICD-10-CM

## 2017-03-17 DIAGNOSIS — H35371 Puckering of macula, right eye: Secondary | ICD-10-CM | POA: Diagnosis not present

## 2017-03-17 LAB — HM DIABETES EYE EXAM

## 2017-03-21 ENCOUNTER — Encounter: Payer: Self-pay | Admitting: Family Medicine

## 2017-04-06 ENCOUNTER — Ambulatory Visit (INDEPENDENT_AMBULATORY_CARE_PROVIDER_SITE_OTHER): Payer: Medicare HMO | Admitting: Family Medicine

## 2017-04-06 ENCOUNTER — Encounter: Payer: Self-pay | Admitting: Family Medicine

## 2017-04-06 VITALS — BP 123/74 | HR 78 | Temp 97.4°F | Resp 16 | Ht 65.0 in | Wt 163.3 lb

## 2017-04-06 DIAGNOSIS — R634 Abnormal weight loss: Secondary | ICD-10-CM

## 2017-04-06 DIAGNOSIS — E785 Hyperlipidemia, unspecified: Secondary | ICD-10-CM

## 2017-04-06 LAB — LIPID PANEL
Cholesterol: 99 mg/dL (ref <200)
HDL: 43 mg/dL (ref >40)
LDL CHOLESTEROL (CALC): 44 mg/dL
Non-HDL Cholesterol (Calc): 56 mg/dL (calc) (ref <130)
Total CHOL/HDL Ratio: 2.3 (calc) (ref <5.0)
Triglycerides: 50 mg/dL (ref <150)

## 2017-04-06 NOTE — Progress Notes (Signed)
Name: Roy Hernandez   MRN: [id number]    DOB: 08/12/1927   Date:04/06/2017       Progress Note  Subjective  Chief Complaint  Chief Complaint  Patient presents with  . Follow-up    6 mo  . Hyperlipidemia    Hyperlipidemia  This is a recurrent problem. The problem is controlled. Recent lipid tests were reviewed and are normal. Pertinent negatives include no leg pain, myalgias or shortness of breath. Current antihyperlipidemic treatment includes statins. Risk factors for coronary artery disease include dyslipidemia.    Patient presents for routine 6 months check up. He feels well, accompanied by his daughter who is concerned hat her father has lost weight in the last 6 months. When asked, patient reports that he is not physically active any more and therefore is not hungry and eats less at mealtime. His daughter believes that patient only eats one good meal a day. Patient denies any chest pain, blood in urine or stool, unusual fatigue.     Past Medical History:  Diagnosis Date  . AAA (abdominal aortic aneurysm) ([place])   . Glaucoma   . Hyperlipidemia   . Hypertension     History reviewed. No pertinent surgical history.  Family History  Problem Relation Age of Onset  . Diabetes Father   . Diabetes Maternal Grandmother   . Diabetes Brother   . Kidney disease Brother        on dialysis    Social History   Social History  . Marital status: Married    Spouse name: N/A  . Number of children: N/A  . Years of education: N/A   Occupational History  . Not on file.   Social History Main Topics  . Smoking status: Former Smoker    Years: 34.00    Types: Cigarettes    Start date: 08/02/1945    Quit date: 05/26/1980  . Smokeless tobacco: Never Used  . Alcohol use No  . Drug use: No  . Sexual activity: Not Currently   Other Topics Concern  . Not on file   Social History Narrative  . No narrative on file     Current Outpatient Prescriptions:  .  Alcohol Swabs (ALCOHOL  PREP) PADS, , Disp: , Rfl:  .  alendronate (FOSAMAX) 70 MG tablet, Take 1 tablet (70 mg total) by mouth once a week. Take with a full glass of water on an empty stomach., Disp: 12 tablet, Rfl: 3 .  aspirin 81 MG tablet, Take 81 mg by mouth daily. , Disp: , Rfl:  .  cholecalciferol (VITAMIN D) 1000 UNITS tablet, Take 1,000 Units by mouth daily. , Disp: , Rfl:  .  latanoprost (XALATAN) 0.005 % ophthalmic solution, Apply to eye., Disp: , Rfl:  .  lisinopril (PRINIVIL,ZESTRIL) 20 MG tablet, Take by mouth., Disp: , Rfl:  .  Menthol-Methyl Salicylate (MUSCLE RUB EX), , Disp: , Rfl:  .  Naproxen Sodium 220 MG CAPS, , Disp: , Rfl:  .  ONE TOUCH ULTRA TEST test strip, TEST BLOOD GLUCOSE LEVEL ONCE A DAY., Disp: 90 each, Rfl: 1 .  pseudoephedrine-acetaminophen (TYLENOL SINUS) 30-500 MG TABS tablet, Take 1 tablet by mouth every 4 (four) hours as needed., Disp: , Rfl:  .  simvastatin (ZOCOR) 10 MG tablet, TAKE ONE TABLET BY MOUTH ONCE DAILY AT  6PM, Disp: 90 tablet, Rfl: 0 .  simvastatin (ZOCOR) 10 MG tablet, TAKE 1 TABLET BY MOUTH ONCE DAILY AT  6PM, Disp: 90 tablet, Rfl: 0 .  timolol (TIMOPTIC) 0.5 % ophthalmic solution, , Disp: , Rfl:   No Known Allergies   Review of Systems  Respiratory: Negative for shortness of breath.   Musculoskeletal: Negative for myalgias.    Objective  Vitals:   04/06/17 0917  BP: 123/74  Pulse: 78  Resp: 16  Temp: (!) 97.4 F (36.3 C)  TempSrc: Oral  SpO2: 95%  Weight: 163 lb 4.8 oz (74.1 kg)  Height: 5\' 5"  (1.651 m)    Physical Exam  Constitutional: He is oriented to person, place, and time and well-developed, well-nourished, and in no distress.  HENT:  Head: Normocephalic and atraumatic.  Cardiovascular: Normal rate, regular rhythm and normal heart sounds.   No murmur heard. Pulmonary/Chest: Effort normal and breath sounds normal. He has no wheezes.  Abdominal: Soft. Bowel sounds are normal. There is no tenderness.  Musculoskeletal: He exhibits no  edema.  Neurological: He is alert and oriented to person, place, and time.  Nursing note and vitals reviewed.     Recent Results (from the past 2160 hour(s))  HM DIABETES EYE EXAM     Status: None   Collection Time: 03/17/17 12:00 AM  Result Value Ref Range   HM Diabetic Eye Exam No Retinopathy No Retinopathy    Comment: Dr. Zetta Bills with [facility]  1. Weight loss Patient has lost approximately 13 pounds in last 6 months, he is no longer physically active as he used to be, appetite has decreased. I have advised him to take 3 meals a day with breakfast being the largest meal, start moderate levels of activity as permitted by his vascular specialist, follow-up in 3 months.  2. Dyslipidemia  - Lipid panel   Treana Lacour Asad A. [facility] 04/06/2017 9:35 AM

## 2017-04-12 ENCOUNTER — Telehealth: Payer: Self-pay

## 2017-04-12 MED ORDER — SIMVASTATIN 5 MG PO TABS: 5.0000 mg | ORAL_TABLET | Freq: Every day | ORAL | 0 refills | Status: DC

## 2017-04-12 NOTE — Telephone Encounter (Signed)
Patient has been notified of lab results and a new prescription for simvastatin 5 mg at bedtime has been sent to [facility] per Dr. Manuella Ghazi, patient verbalized understanding

## 2017-04-19 ENCOUNTER — Other Ambulatory Visit: Payer: Self-pay | Admitting: Family Medicine

## 2017-04-19 DIAGNOSIS — E785 Hyperlipidemia, unspecified: Secondary | ICD-10-CM

## 2017-05-02 ENCOUNTER — Ambulatory Visit (INDEPENDENT_AMBULATORY_CARE_PROVIDER_SITE_OTHER): Payer: Medicare HMO

## 2017-05-02 DIAGNOSIS — Z23 Encounter for immunization: Secondary | ICD-10-CM

## 2017-06-15 DIAGNOSIS — H401133 Primary open-angle glaucoma, bilateral, severe stage: Secondary | ICD-10-CM | POA: Diagnosis not present

## 2017-06-21 DIAGNOSIS — H401133 Primary open-angle glaucoma, bilateral, severe stage: Secondary | ICD-10-CM | POA: Diagnosis not present

## 2017-06-21 LAB — HM DIABETES EYE EXAM

## 2017-07-06 ENCOUNTER — Ambulatory Visit: Payer: Medicare HMO | Admitting: Family Medicine

## 2017-07-06 ENCOUNTER — Encounter: Payer: Self-pay | Admitting: Family Medicine

## 2017-07-06 VITALS — BP 132/80 | HR 84 | Resp 14 | Ht 65.0 in | Wt 165.6 lb

## 2017-07-06 DIAGNOSIS — E7800 Pure hypercholesterolemia, unspecified: Secondary | ICD-10-CM

## 2017-07-06 DIAGNOSIS — I10 Essential (primary) hypertension: Secondary | ICD-10-CM

## 2017-07-06 DIAGNOSIS — M810 Age-related osteoporosis without current pathological fracture: Secondary | ICD-10-CM

## 2017-07-06 MED ORDER — SIMVASTATIN 5 MG PO TABS: 5.0000 mg | ORAL_TABLET | Freq: Every day | ORAL | 0 refills | Status: DC

## 2017-07-06 MED ORDER — LISINOPRIL 20 MG PO TABS: 20.0000 mg | ORAL_TABLET | Freq: Every day | ORAL | 0 refills | Status: DC

## 2017-07-06 NOTE — Progress Notes (Signed)
Name: Roy Hernandez   MRN: [id number]    DOB: 01-Sep-1927   Date:07/06/2017       Progress Note  Subjective  Chief Complaint  No chief complaint on file.   Hyperlipidemia  This is a recurrent problem. The problem is controlled. Recent lipid tests were reviewed and are normal. Pertinent negatives include no chest pain, leg pain, myalgias or shortness of breath. Current antihyperlipidemic treatment includes statins. Risk factors for coronary artery disease include dyslipidemia.  Hypertension  This is a chronic problem. The problem is unchanged. The problem is controlled. Pertinent negatives include no blurred vision, chest pain, headaches, palpitations or shortness of breath. Past treatments include ACE inhibitors.   Pt. Has history of Osteoporosis with DEXA scan BMD at lumbar spine -2.6, he is on Fosamax 70 mg every week, reports no symptoms at this time.  Past Medical History:  Diagnosis Date  . AAA (abdominal aortic aneurysm) ([place])   . Glaucoma   . Hyperlipidemia   . Hypertension     History reviewed. No pertinent surgical history.  Family History  Problem Relation Age of Onset  . Diabetes Father   . Diabetes Maternal Grandmother   . Diabetes Brother   . Kidney disease Brother        on dialysis    Social History   Socioeconomic History  . Marital status: Married    Spouse name: Not on file  . Number of children: Not on file  . Years of education: Not on file  . Highest education level: Not on file  Social Needs  . Financial resource strain: Not on file  . Food insecurity - worry: Not on file  . Food insecurity - inability: Not on file  . Transportation needs - medical: Not on file  . Transportation needs - non-medical: Not on file  Occupational History  . Not on file  Tobacco Use  . Smoking status: Former Smoker    Years: 34.00    Types: Cigarettes    Start date: 08/02/1945    Last attempt to quit: 05/26/1980    Years since quitting: 37.1  . Smokeless tobacco:  Never Used  Substance and Sexual Activity  . Alcohol use: No    Alcohol/week: 0.0 oz  . Drug use: No  . Sexual activity: Not Currently  Other Topics Concern  . Not on file  Social History Narrative  . Not on file     Current Outpatient Medications:  .  Alcohol Swabs (ALCOHOL PREP) PADS, , Disp: , Rfl:  .  alendronate (FOSAMAX) 70 MG tablet, Take 1 tablet (70 mg total) by mouth once a week. Take with a full glass of water on an empty stomach., Disp: 12 tablet, Rfl: 3 .  aspirin 81 MG tablet, Take 81 mg by mouth daily. , Disp: , Rfl:  .  cholecalciferol (VITAMIN D) 1000 UNITS tablet, Take 1,000 Units by mouth daily. , Disp: , Rfl:  .  latanoprost (XALATAN) 0.005 % ophthalmic solution, Apply to eye., Disp: , Rfl:  .  lisinopril (PRINIVIL,ZESTRIL) 20 MG tablet, Take by mouth., Disp: , Rfl:  .  Menthol-Methyl Salicylate (MUSCLE RUB EX), , Disp: , Rfl:  .  Naproxen Sodium 220 MG CAPS, , Disp: , Rfl:  .  ONE TOUCH ULTRA TEST test strip, TEST BLOOD GLUCOSE LEVEL ONCE A DAY., Disp: 90 each, Rfl: 1 .  pseudoephedrine-acetaminophen (TYLENOL SINUS) 30-500 MG TABS tablet, Take 1 tablet by mouth every 4 (four) hours as needed., Disp: , Rfl:  .  simvastatin (ZOCOR) 5 MG tablet, Take 1 tablet (5 mg total) by mouth at bedtime., Disp: 90 tablet, Rfl: 0 .  timolol (TIMOPTIC) 0.5 % ophthalmic solution, , Disp: , Rfl:   No Known Allergies   Review of Systems  Eyes: Negative for blurred vision.  Respiratory: Negative for shortness of breath.   Cardiovascular: Negative for chest pain and palpitations.  Musculoskeletal: Negative for myalgias.  Neurological: Negative for headaches.    Objective  Vitals:   07/06/17 1316  BP: 132/80  Pulse: 84  Resp: 14  SpO2: 96%  Weight: 165 lb 9.6 oz (75.1 kg)  Height: 5\' 5"  (1.651 m)    Physical Exam  Constitutional: He is oriented to person, place, and time and well-developed, well-nourished, and in no distress.  HENT:  Head: Normocephalic and  atraumatic.  Cardiovascular: Normal rate, regular rhythm and normal heart sounds.  No murmur heard. Pulmonary/Chest: Effort normal and breath sounds normal. He has no wheezes.  Abdominal: Soft. Bowel sounds are normal. There is no tenderness.  Musculoskeletal: He exhibits no edema.  Neurological: He is alert and oriented to person, place, and time.  Nursing note and vitals reviewed.     Assessment & Plan  1. Essential hypertension BP stable on present evidence of treatment - lisinopril (PRINIVIL,ZESTRIL) 20 MG tablet; Take 1 tablet (20 mg total) by mouth daily.  Dispense: 90 tablet; Refill: 0  2. Pure hypercholesterolemia  - simvastatin (ZOCOR) 5 MG tablet; Take 1 tablet (5 mg total) by mouth at bedtime.  Dispense: 90 tablet; Refill: 0  3. Osteoporosis of femur without pathological fracture Patient advised to complete the new DEXA scan for management of osteoporosis  Roy Hernandez Asad A. [facility] 07/06/2017 1:26 PM

## 2017-07-16 ENCOUNTER — Other Ambulatory Visit: Payer: Self-pay | Admitting: Family Medicine

## 2017-07-18 ENCOUNTER — Other Ambulatory Visit: Payer: Self-pay | Admitting: Family Medicine

## 2017-07-20 NOTE — Telephone Encounter (Signed)
°  Relation to [id number] Call back [place]: [place], [place] [phone] (Phone) [phone] (Fax)     Reason for call:  Patient checking on the status alendronate (FOSAMAX) 70 MG tablet, patient contacted pharmacy informed patient please allow 72 hour turn around time, please advise

## 2017-07-23 ENCOUNTER — Other Ambulatory Visit: Payer: Self-pay | Admitting: Family Medicine

## 2017-09-05 DIAGNOSIS — Z85820 Personal history of malignant melanoma of skin: Secondary | ICD-10-CM | POA: Diagnosis not present

## 2017-09-05 DIAGNOSIS — L570 Actinic keratosis: Secondary | ICD-10-CM | POA: Diagnosis not present

## 2017-09-05 DIAGNOSIS — D1800 Hemangioma unspecified site: Secondary | ICD-10-CM | POA: Diagnosis not present

## 2017-09-05 DIAGNOSIS — Z85828 Personal history of other malignant neoplasm of skin: Secondary | ICD-10-CM | POA: Diagnosis not present

## 2017-09-05 DIAGNOSIS — L578 Other skin changes due to chronic exposure to nonionizing radiation: Secondary | ICD-10-CM | POA: Diagnosis not present

## 2017-09-05 DIAGNOSIS — L821 Other seborrheic keratosis: Secondary | ICD-10-CM | POA: Diagnosis not present

## 2017-09-05 DIAGNOSIS — Z1283 Encounter for screening for malignant neoplasm of skin: Secondary | ICD-10-CM | POA: Diagnosis not present

## 2017-09-08 ENCOUNTER — Encounter: Payer: Self-pay | Admitting: Family Medicine

## 2017-09-08 ENCOUNTER — Ambulatory Visit (INDEPENDENT_AMBULATORY_CARE_PROVIDER_SITE_OTHER): Payer: Medicare HMO | Admitting: Family Medicine

## 2017-09-08 VITALS — BP 128/82 | HR 87 | Temp 98.0°F | Resp 16 | Ht 65.0 in | Wt 169.7 lb

## 2017-09-08 DIAGNOSIS — R209 Unspecified disturbances of skin sensation: Secondary | ICD-10-CM | POA: Diagnosis not present

## 2017-09-08 NOTE — Progress Notes (Signed)
Name: Roy Hernandez   MRN: [id number]    DOB: 1928-01-17   Date:09/08/2017       Progress Note  Subjective  Chief Complaint  Chief Complaint  Patient presents with  . Extremity Weakness    numbness in both legs. onset for over a year. Pt states that goes to his toes and he wake up feet a cold not every night.  Some balance issues as well. PT state its not often.     HPI  Pt. Reports numbness in his feet, feel cold this has been going on for at least 3-4 years but now it feels like its getting worse. He sometimes has to wake up at night to soak his feet in hot water which helps relieve the numbness. Pt. Has history of Peripheral Arterial Disease, sometimes he has to wear two pairs of socks to keep his feet warm.    Past Medical History:  Diagnosis Date  . AAA (abdominal aortic aneurysm) ([place])   . Glaucoma   . Hyperlipidemia   . Hypertension     No past surgical history on file.  Family History  Problem Relation Age of Onset  . Diabetes Father   . Diabetes Maternal Grandmother   . Diabetes Brother   . Kidney disease Brother        on dialysis    Social History   Socioeconomic History  . Marital status: Married    Spouse name: Not on file  . Number of children: Not on file  . Years of education: Not on file  . Highest education level: Not on file  Social Needs  . Financial resource strain: Not on file  . Food insecurity - worry: Not on file  . Food insecurity - inability: Not on file  . Transportation needs - medical: Not on file  . Transportation needs - non-medical: Not on file  Occupational History  . Not on file  Tobacco Use  . Smoking status: Former Smoker    Years: 34.00    Types: Cigarettes    Start date: 08/02/1945    Last attempt to quit: 05/26/1980    Years since quitting: 37.3  . Smokeless tobacco: Never Used  Substance and Sexual Activity  . Alcohol use: No    Alcohol/week: 0.0 oz  . Drug use: No  . Sexual activity: Not Currently  Other Topics  Concern  . Not on file  Social History Narrative  . Not on file     Current Outpatient Medications:  .  Alcohol Swabs (ALCOHOL PREP) PADS, , Disp: , Rfl:  .  alendronate (FOSAMAX) 70 MG tablet, Take 1 tablet (70 mg total) by mouth once a week. Take with a full glass of water on an empty stomach., Disp: 12 tablet, Rfl: 3 .  aspirin 81 MG tablet, Take 81 mg by mouth daily. , Disp: , Rfl:  .  cholecalciferol (VITAMIN D) 1000 UNITS tablet, Take 1,000 Units by mouth daily. , Disp: , Rfl:  .  latanoprost (XALATAN) 0.005 % ophthalmic solution, Apply to eye., Disp: , Rfl:  .  lisinopril (PRINIVIL,ZESTRIL) 20 MG tablet, Take 1 tablet (20 mg total) by mouth daily., Disp: 90 tablet, Rfl: 0 .  Menthol-Methyl Salicylate (MUSCLE RUB EX), , Disp: , Rfl:  .  Naproxen Sodium 220 MG CAPS, , Disp: , Rfl:  .  ONE TOUCH ULTRA TEST test strip, TEST BLOOD GLUCOSE LEVEL ONCE A DAY., Disp: 90 each, Rfl: 1 .  pseudoephedrine-acetaminophen (TYLENOL SINUS) 30-500 MG  TABS tablet, Take 1 tablet by mouth every 4 (four) hours as needed., Disp: , Rfl:  .  simvastatin (ZOCOR) 5 MG tablet, Take 1 tablet (5 mg total) by mouth at bedtime., Disp: 90 tablet, Rfl: 0 .  timolol (TIMOPTIC) 0.5 % ophthalmic solution, , Disp: , Rfl:   No Known Allergies   ROS  Please see history of present illness for complete discussion of ROS  Objective  Vitals:   09/08/17 1327  BP: 128/82  Pulse: 87  Resp: 16  Temp: 98 F (36.7 C)  TempSrc: Oral  SpO2: 96%  Weight: 169 lb 11.2 oz (77 kg)  Height: 5\' 5"  (1.651 m)    Physical Exam  Constitutional: He is oriented to person, place, and time and well-developed, well-nourished, and in no distress.  Musculoskeletal:       Right ankle: He exhibits normal range of motion and no swelling.       Right foot: There is decreased capillary refill. There is no swelling.       Left foot: There is decreased capillary refill. There is no swelling.  Cool appearing bilateral lower extremities,  no hair on his feet, intact Dorsalis Pedis pulses but diminished posterior tibialis pulses bilaterally, sluggish capillary refill,   Neurological: He is alert and oriented to person, place, and time.  Nursing note and vitals reviewed.       Assessment & Plan  1. Bilateral cold feet Suspect peripheral arterial disease, patient has seen vascular in the past, referral provided - Ambulatory referral to Vascular Surgery   Mersadies Petree Asad A. [facility] 09/08/2017 1:39 PM

## 2017-09-12 ENCOUNTER — Other Ambulatory Visit (INDEPENDENT_AMBULATORY_CARE_PROVIDER_SITE_OTHER): Payer: Self-pay | Admitting: Vascular Surgery

## 2017-09-12 ENCOUNTER — Ambulatory Visit (INDEPENDENT_AMBULATORY_CARE_PROVIDER_SITE_OTHER): Payer: Medicare HMO | Admitting: Vascular Surgery

## 2017-09-12 ENCOUNTER — Ambulatory Visit (INDEPENDENT_AMBULATORY_CARE_PROVIDER_SITE_OTHER): Payer: Medicare HMO

## 2017-09-12 DIAGNOSIS — R209 Unspecified disturbances of skin sensation: Secondary | ICD-10-CM

## 2017-09-12 DIAGNOSIS — R200 Anesthesia of skin: Secondary | ICD-10-CM

## 2017-09-23 ENCOUNTER — Telehealth: Payer: Self-pay

## 2017-09-23 NOTE — Telephone Encounter (Signed)
Called pt to sched AWV w/ [facility]. Appt has been scheduled for 10/04/17 @ 10:20am. Will see Dr. Ancil Boozer to follow @ 11:20am.

## 2017-10-04 ENCOUNTER — Encounter: Payer: Self-pay | Admitting: Family Medicine

## 2017-10-04 ENCOUNTER — Telehealth: Payer: Self-pay

## 2017-10-04 ENCOUNTER — Ambulatory Visit: Payer: Medicare HMO | Admitting: Family Medicine

## 2017-10-04 ENCOUNTER — Ambulatory Visit (INDEPENDENT_AMBULATORY_CARE_PROVIDER_SITE_OTHER): Payer: Medicare HMO

## 2017-10-04 VITALS — BP 110/60 | HR 74 | Temp 97.1°F | Resp 16 | Ht 65.0 in | Wt 168.4 lb

## 2017-10-04 DIAGNOSIS — M810 Age-related osteoporosis without current pathological fracture: Secondary | ICD-10-CM

## 2017-10-04 DIAGNOSIS — D692 Other nonthrombocytopenic purpura: Secondary | ICD-10-CM

## 2017-10-04 DIAGNOSIS — G3184 Mild cognitive impairment, so stated: Secondary | ICD-10-CM

## 2017-10-04 DIAGNOSIS — E785 Hyperlipidemia, unspecified: Secondary | ICD-10-CM

## 2017-10-04 DIAGNOSIS — I714 Abdominal aortic aneurysm, without rupture, unspecified: Secondary | ICD-10-CM

## 2017-10-04 DIAGNOSIS — I10 Essential (primary) hypertension: Secondary | ICD-10-CM

## 2017-10-04 DIAGNOSIS — Z0000 Encounter for general adult medical examination without abnormal findings: Secondary | ICD-10-CM | POA: Diagnosis not present

## 2017-10-04 DIAGNOSIS — Z8669 Personal history of other diseases of the nervous system and sense organs: Secondary | ICD-10-CM

## 2017-10-04 DIAGNOSIS — E119 Type 2 diabetes mellitus without complications: Secondary | ICD-10-CM

## 2017-10-04 DIAGNOSIS — R739 Hyperglycemia, unspecified: Secondary | ICD-10-CM | POA: Insufficient documentation

## 2017-10-04 DIAGNOSIS — M1990 Unspecified osteoarthritis, unspecified site: Secondary | ICD-10-CM | POA: Insufficient documentation

## 2017-10-04 DIAGNOSIS — E7800 Pure hypercholesterolemia, unspecified: Secondary | ICD-10-CM | POA: Diagnosis not present

## 2017-10-04 DIAGNOSIS — I739 Peripheral vascular disease, unspecified: Secondary | ICD-10-CM

## 2017-10-04 DIAGNOSIS — R202 Paresthesia of skin: Secondary | ICD-10-CM

## 2017-10-04 DIAGNOSIS — H409 Unspecified glaucoma: Secondary | ICD-10-CM | POA: Insufficient documentation

## 2017-10-04 MED ORDER — LISINOPRIL 20 MG PO TABS: 20.0000 mg | ORAL_TABLET | Freq: Every day | ORAL | 1 refills | Status: DC

## 2017-10-04 MED ORDER — SIMVASTATIN 5 MG PO TABS: 5.0000 mg | ORAL_TABLET | Freq: Every day | ORAL | 1 refills | Status: DC

## 2017-10-04 MED ORDER — ALENDRONATE SODIUM 70 MG PO TABS: 70.0000 mg | ORAL_TABLET | ORAL | 3 refills | Status: DC

## 2017-10-04 MED ORDER — LISINOPRIL 10 MG PO TABS: 10.0000 mg | ORAL_TABLET | Freq: Every day | ORAL | 1 refills | Status: DC

## 2017-10-04 MED ORDER — DONEPEZIL HCL 5 MG PO TABS: 5.0000 mg | ORAL_TABLET | Freq: Every day | ORAL | 0 refills | Status: DC

## 2017-10-04 NOTE — Progress Notes (Signed)
Name: Roy Hernandez   MRN: [id number]    DOB: [birth year]   Date:10/04/2017       Progress Note  Subjective  Chief Complaint  Chief Complaint  Patient presents with  . Hypertension    Needs Refill on Lisinopril-Dizzy and [place] occasionally  . Hyperlipidemia    Needs Refill on Simvastatin  . Medication Refill    HPI  DM II: diagnosed many years ago, but lost weight a couple of years ago and it became diet controlled. He is up to date with eye exam. Goes to [employer]. He is not checking glucose at home. He denies symptoms of hypoglycemia. He is on statin therapy, ace and aspirin.   HTN: towards low end of normal, he gets dizzy at times and history of fall, so we will decrease dose of Lisinopril . No chest pain or palpitation  Hyperlipidemia: he is taking very low dose of simvastatin, no side effects  Paresthesia: he has noticed numbness on both feet a few years ago, but getting worse. It is waking him up at night occasionally now. B12 not on his records.   Mild decrease in cognitive function: still lives alone, came in today with his daughter - [name] - his CIT 6 has increased from 12 to 21 over the past year. He still drives to grocery stores, and girlfriend's house and church. Not getting lost. His daughter is paying his bills ( since his wife died in 2010-11-23) He has episodes that he repeats himself.   PAD/AAA: seeing vascular surgeon. Taking statin therapy and aspirin  Osteoporosis with history of compression fractures spine: taking fosamax, discussed high calcium diet and continue vitamin D supplementation  Patient Active Problem List   Diagnosis Date Noted  . DJD (degenerative joint disease) 10/04/2017  . Hyperglycemia 10/04/2017  . Glaucoma 10/04/2017  . Hypertension, benign 07/06/2017  . PAD (peripheral artery disease) ([place]) 10/19/2016  . Osteoporosis of femur without pathological fracture 11/25/2015  . AAA (abdominal aortic aneurysm) without rupture ([place])  11/05/2014  . Abnormal ECG 11/05/2014  . At risk for falling 11/05/2014  . DD (diverticular disease) 11/05/2014  . Dyslipidemia 11/05/2014  . Failure of erection 11/05/2014  . Degenerative arthritis of lumbar spine 11/05/2014  . Compressed spine fracture ([place]) 11/05/2014  . Basal cell papilloma 11/05/2014  . Vitamin D deficiency 11/05/2014    Past Surgical History:  Procedure Laterality Date  . CATARACT EXTRACTION    . TOOTH EXTRACTION      Family History  Problem Relation Age of Onset  . Cancer Mother   . Hypertension Mother   . Diabetes Father   . Hyperlipidemia Father   . Diabetes Maternal Grandmother   . Diabetes Brother   . Kidney disease Brother        on dialysis  . Diabetes Sister   . Kidney disease Sister        dialysis  . Cancer Brother        esophageal  . Leukemia Sister     Social History   Socioeconomic History  . Marital status: Widowed    Spouse name: [name]  . Number of children: 3  . Years of education: Not on file  . Highest education level: 9th grade  Social Needs  . Financial resource strain: Not hard at all  . Food insecurity - worry: Never true  . Food insecurity - inability: Never true  . Transportation needs - medical: No  . Transportation needs - non-medical: No  Occupational History    Employer: [employer]  Tobacco Use  . Smoking status: Former Smoker    Packs/day: 1.00    Years: 34.00    Pack years: 34.00    Types: Cigarettes    Start date: 08/02/1945    Last attempt to quit: 05/26/1980    Years since quitting: 37.3  . Smokeless tobacco: Never Used  . Tobacco comment: smoking cessation materials not required  Substance and Sexual Activity  . Alcohol use: No    Alcohol/week: 0.0 oz  . Drug use: No  . Sexual activity: Not Currently  Other Topics Concern  . Not on file  Social History Narrative  . Not on file     Current Outpatient Medications:  .  Alcohol Swabs (ALCOHOL PREP) PADS, , Disp: , Rfl:  .  alendronate  (FOSAMAX) 70 MG tablet, Take 1 tablet (70 mg total) by mouth once a week. Take with a full glass of water on an empty stomach., Disp: 12 tablet, Rfl: 3 .  aspirin 81 MG tablet, Take 81 mg by mouth daily. , Disp: , Rfl:  .  cholecalciferol (VITAMIN D) 1000 UNITS tablet, Take 1,000 Units by mouth daily. , Disp: , Rfl:  .  latanoprost (XALATAN) 0.005 % ophthalmic solution, Apply to eye., Disp: , Rfl:  .  lisinopril (PRINIVIL,ZESTRIL) 20 MG tablet, Take 1 tablet (20 mg total) by mouth daily., Disp: 90 tablet, Rfl: 1 .  Menthol-Methyl Salicylate (MUSCLE RUB EX), , Disp: , Rfl:  .  Naproxen Sodium 220 MG CAPS, Take 220 mg by mouth daily as needed. , Disp: , Rfl:  .  ONE TOUCH ULTRA TEST test strip, TEST BLOOD GLUCOSE LEVEL ONCE A DAY., Disp: 90 each, Rfl: 1 .  pseudoephedrine-acetaminophen (TYLENOL SINUS) 30-500 MG TABS tablet, Take 1 tablet by mouth every 4 (four) hours as needed., Disp: , Rfl:  .  simvastatin (ZOCOR) 5 MG tablet, Take 1 tablet (5 mg total) by mouth at bedtime., Disp: 90 tablet, Rfl: 1 .  timolol (TIMOPTIC) 0.5 % ophthalmic solution, , Disp: , Rfl:  .  donepezil (ARICEPT) 5 MG tablet, Take 1 tablet (5 mg total) by mouth at bedtime., Disp: 30 tablet, Rfl: 0  No Known Allergies   ROS  Constitutional: Negative for fever or weight change.  Respiratory: Negative for cough and shortness of breath.   Cardiovascular: Negative for chest pain or palpitations.  Gastrointestinal: Negative for abdominal pain, no bowel changes.  Musculoskeletal:Positive for gait problem but no  joint swelling.  Skin: Negative for rash.  Neurological: Negative for dizziness or headache.  No other specific complaints in a complete review of systems (except as listed in HPI above).  Objective  Vitals:   10/04/17 1145  BP: 110/60  Pulse: 74  Resp: 16  Temp: (!) 97.1 F (36.2 C)  TempSrc: Oral  SpO2: 95%  Weight: 168 lb 6.4 oz (76.4 kg)  Height: 5\' 5"  (1.651 m)    Body mass index is 28.02  kg/m.  Physical Exam  Constitutional: Patient appears well-developed and well-nourished.  No distress.  HEENT: head atraumatic, normocephalic, pupils equal and reactive to light, neck supple, throat within normal limits Cardiovascular: Normal rate, regular rhythm and normal heart sounds.  No murmur heard. No BLE edema, distal pulses weak.  Pulmonary/Chest: Effort normal and breath sounds normal. No respiratory distress. Abdominal: Soft.  There is no tenderness. Psychiatric: Patient has a normal mood and affect. behavior is normal. Judgment and thought content normal.  Diabetic Foot  Exam: Diabetic Foot Exam - Simple   Simple Foot Form Diabetic Foot exam was performed with the following findings:  Yes 10/04/2017 12:16 PM  Visual Inspection No deformities, no ulcerations, no other skin breakdown bilaterally:  Yes Sensation Testing See comments:  Yes Pulse Check Posterior Tibialis and Dorsalis pulse intact bilaterally:  Yes Comments      PHQ2/9: Depression screen [facility] 2/9 10/04/2017 10/04/2017 09/08/2017 04/06/2017 10/04/2016  Decreased Interest 0 0 0 0 0  Down, Depressed, Hopeless 0 0 0 0 0  PHQ - 2 Score 0 0 0 0 0  Altered sleeping 0 - - - -  Tired, decreased energy 0 - - - -  Change in appetite 0 - - - -  Feeling bad or failure about yourself  0 - - - -  Trouble concentrating 0 - - - -  Moving slowly or fidgety/restless 0 - - - -  Suicidal thoughts 0 - - - -  PHQ-9 Score 0 - - - -  Difficult doing work/chores Not difficult at all - - - -     Fall Risk: Fall Risk  10/04/2017 09/08/2017 07/06/2017 04/06/2017 10/04/2016  Falls in the past year? Yes No No No Yes  Number falls in past yr: 2 or more - - - 2 or more  Injury with Fall? No - - - No  Risk Factor Category  High Fall Risk - - - -  Risk for fall due to : History of fall(s);Impaired balance/gait;Impaired vision - - - -  Risk for fall due to: Comment slow gait, weak at times when walking, drags feet; wears glasses - - - -  Follow up  Falls evaluation completed;Education provided;Falls prevention discussed - - - Falls prevention discussed       Assessment & Plan  1. Diabetes mellitus type 2, diet-controlled ([place])  Diet controlled, but possible neuropathy from DM, we will make sure not secondary to B12 deficiency  - Hemoglobin A1c - Urine Microalbumin w/creat. ratio  2. Paresthesia of both feet  - Vitamin B12  3. Essential hypertension  BP usually good, slightly low today, but he states fasting all morning.  - lisinopril (PRINIVIL,ZESTRIL) 10 MG tablet; Take 1 tablet (10mg  total) by mouth daily.  Dispense: 90 tablet; Refill: 1 - COMPLETE METABOLIC PANEL WITH GFR - CBC with Differential/Platelet  4. Dyslipidemia  - Lipid panel  5. History of spinal cord compression  Continue fosamax  6. Osteoporosis of femur without pathological fracture  - alendronate (FOSAMAX) 70 MG tablet; Take 1 tablet (70 mg total) by mouth once a week. Take with a full glass of water on an empty stomach.  Dispense: 12 tablet; Refill: 3 - VITAMIN D 25 Hydroxy (Vit-D Deficiency, Fractures)  7. Pure hypercholesterolemia  - simvastatin (ZOCOR) 5 MG tablet; Take 1 tablet (5 mg total) by mouth at bedtime.  Dispense: 90 tablet; Refill: 1  8. PAD (peripheral artery disease) ([place])  Seeing vascular surgeon, on statin and aspirin, bp is well controlled   9. Abdominal aortic aneurysm (AAA) without rupture ([place])   10. Senile purpura ([place])  Gave patient reassurance  11. Mild cognitive impairment  - donepezil (ARICEPT) 5 MG tablet; Take 1 tablet (5 mg total) by mouth at bedtime.  Dispense: 30 tablet; Refill: 0 - TSH

## 2017-10-04 NOTE — Patient Instructions (Signed)
Roy Hernandez , Thank you for taking time to come for your Medicare Wellness Visit. I appreciate your ongoing commitment to your health goals. Please review the following plan we discussed and let me know if I can assist you in the future.   Screening recommendations/referrals: Colorectal Screening: Completed colonoscopy 11/14/12. Colorectal screenings no longer required. Lung Cancer Screening: You do not qualify for this screening Hepatitis C Screening: You do not qualify for this screening HIV/Syphilis/Hepatitis B Screening: You do not qualify for this screening   Vision/Dental/Diabetic Exams: Diabetic Exams: Recommend annual diabetic eye exams for retinopathy and diabetic foot exams.  Diabetic Eye Exam: Completed 03/17/17 Diabetic Foot Exam: Dr. Ancil Boozer will complete this exam today Recommended yearly ophthalmology/optometry visit for glaucoma screening and checkup Recommended yearly dental visit for hygiene and checkup  Vaccinations: Influenza vaccine: Up to date Pneumococcal vaccine: Completed series Tdap vaccine: Declined. Please call your insurance company to determine your out of pocket expense. You may also receive this vaccine at your local pharmacy or Health Dept. Shingles vaccine: Please call your insurance company to determine your out of pocket expense for the Shingrix vaccine. You may also receive this vaccine at your local pharmacy or Health Dept.  Advanced directives: Advance directive discussed with you today. I have provided a copy for you to complete at home and have notarized. Once this is complete please bring a copy in to our office so we can scan it into your chart.  Conditions/risks identified: Recommend to drink at least 6-8 8oz glasses of water per day.  Next appointment: Please schedule your Annual Wellness Visit with your Nurse Health Advisor in one year.  Preventive Care 77 Years and Older, Male Preventive care refers to lifestyle choices and visits with your health  care provider that can promote health and wellness. What does preventive care include?  A yearly physical exam. This is also called an annual well check.  Dental exams once or twice a year.  Routine eye exams. Ask your health care provider how often you should have your eyes checked.  Personal lifestyle choices, including:  Daily care of your teeth and gums.  Regular physical activity.  Eating a healthy diet.  Avoiding tobacco and drug use.  Limiting alcohol use.  Practicing safe sex.  Taking low doses of aspirin every day.  Taking vitamin and mineral supplements as recommended by your health care provider. What happens during an annual well check? The services and screenings done by your health care provider during your annual well check will depend on your age, overall health, lifestyle risk factors, and family history of disease. Counseling  Your health care provider may ask you questions about your:  Alcohol use.  Tobacco use.  Drug use.  Emotional well-being.  Home and relationship well-being.  Sexual activity.  Eating habits.  History of falls.  Memory and ability to understand (cognition).  Work and work Statistician. Screening  You may have the following tests or measurements:  Height, weight, and BMI.  Blood pressure.  Lipid and cholesterol levels. These may be checked every 5 years, or more frequently if you are over 71 years old.  Skin check.  Lung cancer screening. You may have this screening every year starting at age 52 if you have a 30-pack-year history of smoking and currently smoke or have quit within the past 15 years.  Fecal occult blood test (FOBT) of the stool. You may have this test every year starting at age 73.  Flexible sigmoidoscopy or colonoscopy.  You may have a sigmoidoscopy every 5 years or a colonoscopy every 10 years starting at age 51.  Prostate cancer screening. Recommendations will vary depending on your family  history and other risks.  Hepatitis C blood test.  Hepatitis B blood test.  Sexually transmitted disease (STD) testing.  Diabetes screening. This is done by checking your blood sugar (glucose) after you have not eaten for a while (fasting). You may have this done every 1-3 years.  Abdominal aortic aneurysm (AAA) screening. You may need this if you are a current or former smoker.  Osteoporosis. You may be screened starting at age 66 if you are at high risk. Talk with your health care provider about your test results, treatment options, and if necessary, the need for more tests. Vaccines  Your health care provider may recommend certain vaccines, such as:  Influenza vaccine. This is recommended every year.  Tetanus, diphtheria, and acellular pertussis (Tdap, Td) vaccine. You may need a Td booster every 10 years.  Zoster vaccine. You may need this after age 64.  Pneumococcal 13-valent conjugate (PCV13) vaccine. One dose is recommended after age 66.  Pneumococcal polysaccharide (PPSV23) vaccine. One dose is recommended after age 1. Talk to your health care provider about which screenings and vaccines you need and how often you need them. This information is not intended to replace advice given to you by your health care provider. Make sure you discuss any questions you have with your health care provider. Document Released: 08/15/2015 Document Revised: 04/07/2016 Document Reviewed: 05/20/2015 Elsevier Interactive Patient Education  2017 [place] Prevention in the Home Falls can cause injuries. They can happen to people of all ages. There are many things you can do to make your home safe and to help prevent falls. What can I do on the outside of my home?  Regularly fix the edges of walkways and driveways and fix any cracks.  Remove anything that might make you trip as you walk through a door, such as a raised step or threshold.  Trim any bushes or trees on the path to  your home.  Use bright outdoor lighting.  Clear any walking paths of anything that might make someone trip, such as rocks or tools.  Regularly check to see if handrails are loose or broken. Make sure that both sides of any steps have handrails.  Any raised decks and porches should have guardrails on the edges.  Have any leaves, snow, or ice cleared regularly.  Use sand or salt on walking paths during winter.  Clean up any spills in your garage right away. This includes oil or grease spills. What can I do in the bathroom?  Use night lights.  Install grab bars by the toilet and in the tub and shower. Do not use towel bars as grab bars.  Use non-skid mats or decals in the tub or shower.  If you need to sit down in the shower, use a plastic, non-slip stool.  Keep the floor dry. Clean up any water that spills on the floor as soon as it happens.  Remove soap buildup in the tub or shower regularly.  Attach bath mats securely with double-sided non-slip rug tape.  Do not have throw rugs and other things on the floor that can make you trip. What can I do in the bedroom?  Use night lights.  Make sure that you have a light by your bed that is easy to reach.  Do not use any sheets  or blankets that are too big for your bed. They should not hang down onto the floor.  Have a firm chair that has side arms. You can use this for support while you get dressed.  Do not have throw rugs and other things on the floor that can make you trip. What can I do in the kitchen?  Clean up any spills right away.  Avoid walking on wet floors.  Keep items that you use a lot in easy-to-reach places.  If you need to reach something above you, use a strong step stool that has a grab bar.  Keep electrical cords out of the way.  Do not use floor polish or wax that makes floors slippery. If you must use wax, use non-skid floor wax.  Do not have throw rugs and other things on the floor that can make  you trip. What can I do with my stairs?  Do not leave any items on the stairs.  Make sure that there are handrails on both sides of the stairs and use them. Fix handrails that are broken or loose. Make sure that handrails are as long as the stairways.  Check any carpeting to make sure that it is firmly attached to the stairs. Fix any carpet that is loose or worn.  Avoid having throw rugs at the top or bottom of the stairs. If you do have throw rugs, attach them to the floor with carpet tape.  Make sure that you have a light switch at the top of the stairs and the bottom of the stairs. If you do not have them, ask someone to add them for you. What else can I do to help prevent falls?  Wear shoes that:  Do not have high heels.  Have rubber bottoms.  Are comfortable and fit you well.  Are closed at the toe. Do not wear sandals.  If you use a stepladder:  Make sure that it is fully opened. Do not climb a closed stepladder.  Make sure that both sides of the stepladder are locked into place.  Ask someone to hold it for you, if possible.  Clearly mark and make sure that you can see:  Any grab bars or handrails.  First and last steps.  Where the edge of each step is.  Use tools that help you move around (mobility aids) if they are needed. These include:  Canes.  Walkers.  Scooters.  Crutches.  Turn on the lights when you go into a dark area. Replace any light bulbs as soon as they burn out.  Set up your furniture so you have a clear path. Avoid moving your furniture around.  If any of your floors are uneven, fix them.  If there are any pets around you, be aware of where they are.  Review your medicines with your doctor. Some medicines can make you feel dizzy. This can increase your chance of falling. Ask your doctor what other things that you can do to help prevent falls. This information is not intended to replace advice given to you by your health care provider.  Make sure you discuss any questions you have with your health care provider. Document Released: 05/15/2009 Document Revised: 12/25/2015 Document Reviewed: 08/23/2014 Elsevier Interactive Patient Education  2017 [employer].

## 2017-10-04 NOTE — Progress Notes (Addendum)
Subjective:   Roy Hernandez is a 84 y.o. male who presents for Medicare Annual/Subsequent preventive examination.  Review of Systems:  N/A Cardiac Risk Factors include: advanced age (>51men, >21 women);diabetes mellitus;male gender;hypertension;dyslipidemia     Objective:    Vitals: BP 110/60 (BP Location: Left Arm, Patient Position: Sitting, Cuff Size: Normal)   Pulse 74   Temp (!) 97.1 F (36.2 C) (Oral)   Resp 16   Ht 5\' 5"  (1.651 m)   Wt 168 lb 6.4 oz (76.4 kg)   SpO2 95%   BMI 28.02 kg/m   Body mass index is 28.02 kg/m.  Advanced Directives 10/04/2017 04/06/2017 10/04/2016 08/04/2016 07/09/2016 01/08/2016 11/25/2015  Does Patient Have a Medical Advance Directive? No No Yes No No No No  Type of Advance Directive - - Living will;Healthcare Power of Attorney - - - -  Does patient want to make changes to medical advance directive? - - - - - - -  Copy of Chance in Chart? - - No - copy requested - - - -  Would patient like information on creating a medical advance directive? Yes (MAU/Ambulatory/Procedural Areas - Information given) - - - - No - patient declined information No - patient declined information    Tobacco Social History   Tobacco Use  Smoking Status Former Smoker  . Packs/day: 1.00  . Years: 34.00  . Pack years: 34.00  . Types: Cigarettes  . Start date: 08/02/1945  . Last attempt to quit: 05/26/1980  . Years since quitting: 37.3  Smokeless Tobacco Never Used  Tobacco Comment   smoking cessation materials not required     Counseling given: No Comment: smoking cessation materials not required   Clinical Intake:  Pre-visit preparation completed: Yes  Pain : No/denies pain   BMI - recorded: 28.02 Nutritional Status: BMI 25 -29 Overweight Nutritional Risks: None Diabetes: Yes. Daughter and patient would like to discuss further about resolving this diagnosis. Does not believe pt is diabetic any longer. Does still c/o B neuropathy with lower  extremities. CBG done?: No Did pt. bring in CBG monitor from home?: No  How often do you need to have someone help you when you read instructions, pamphlets, or other written materials from your doctor or pharmacy?: 1 - Never  Interpreter Needed?: No  Information entered by :: AEversole, LPN  Hospitalizations, surgeries, and ER visits occurring within the previous 12 months:  Within the previous 12 months, pt has not underwent any surgical procedures, has not been hospitalized for any conditions and has not been treated by an emergency room clinician.  Past Medical History:  Diagnosis Date  . AAA (abdominal aortic aneurysm) ([place])   . Glaucoma   . Hyperkalemia   . Hyperlipidemia   . Hypertension   . Peripheral vascular disease ([place])   . Vitamin D deficiency    Past Surgical History:  Procedure Laterality Date  . CATARACT EXTRACTION    . TOOTH EXTRACTION     Family History  Problem Relation Age of Onset  . Cancer Mother   . Hypertension Mother   . Diabetes Father   . Hyperlipidemia Father   . Diabetes Maternal Grandmother   . Diabetes Brother   . Kidney disease Brother        on dialysis  . Diabetes Sister   . Kidney disease Sister        dialysis  . Cancer Brother        esophageal  . Leukemia  Sister    Social History   Socioeconomic History  . Marital status: Widowed    Spouse name: [name]  . Number of children: 3  . Years of education: None  . Highest education level: 9th grade  Social Needs  . Financial resource strain: Not hard at all  . Food insecurity - worry: Never true  . Food insecurity - inability: Never true  . Transportation needs - medical: No  . Transportation needs - non-medical: No  Occupational History    Employer: [employer]  Tobacco Use  . Smoking status: Former Smoker    Packs/day: 1.00    Years: 34.00    Pack years: 34.00    Types: Cigarettes    Start date: 08/02/1945    Last attempt to quit: 05/26/1980    Years since quitting: 37.3   . Smokeless tobacco: Never Used  . Tobacco comment: smoking cessation materials not required  Substance and Sexual Activity  . Alcohol use: No    Alcohol/week: 0.0 oz  . Drug use: No  . Sexual activity: Not Currently  Other Topics Concern  . None  Social History Narrative  . None    Outpatient Encounter Medications as of 10/04/2017  Medication Sig  . Alcohol Swabs (ALCOHOL PREP) PADS   . alendronate (FOSAMAX) 70 MG tablet Take 1 tablet (70 mg total) by mouth once a week. Take with a full glass of water on an empty stomach.  [name] aspirin 81 MG tablet Take 81 mg by mouth daily.   . cholecalciferol (VITAMIN D) 1000 UNITS tablet Take 1,000 Units by mouth daily.   [name] latanoprost (XALATAN) 0.005 % ophthalmic solution Apply to eye.  [name] lisinopril (PRINIVIL,ZESTRIL) 20 MG tablet Take 1 tablet (20 mg total) by mouth daily.  . Menthol-Methyl Salicylate (MUSCLE RUB EX)   . Naproxen Sodium 220 MG CAPS Take 220 mg by mouth daily as needed.   . ONE TOUCH ULTRA TEST test strip TEST BLOOD GLUCOSE LEVEL ONCE A DAY.  [name] pseudoephedrine-acetaminophen (TYLENOL SINUS) 30-500 MG TABS tablet Take 1 tablet by mouth every 4 (four) hours as needed.  . simvastatin (ZOCOR) 5 MG tablet Take 1 tablet (5 mg total) by mouth at bedtime.  . timolol (TIMOPTIC) 0.5 % ophthalmic solution    No facility-administered encounter medications on file as of 10/04/2017.     Activities of Daily Living In your present state of health, do you have any difficulty performing the following activities: 10/04/2017 09/08/2017  Hearing? Y N  Comment tinnitus; denies hearing aids -  Vision? N Y  Comment wears eyeglasses -  Difficulty concentrating or making decisions? Y N  Comment short term memory loss -  Walking or climbing stairs? N Y  Dressing or bathing? N Y  Doing errands, shopping? Y N  Comment both pt and family transports -  Conservation officer, nature and eating ? N -  Comment full set upper and lower dentures -  Using the Toilet? N -  In  the past six months, have you accidently leaked urine? Y -  Comment frequency, urgency -  Do you have problems with loss of bowel control? Y -  Comment diarrhea -  Managing your Medications? N -  Managing your Finances? Y -  Comment dtr manages finances -  Housekeeping or managing your Housekeeping? N -  Some recent data might be hidden    Patient Care Team: [name], MD as PCP - General (Family Medicine) Brendolyn Patty, MD as Consulting Physician (Dermatology) Schnier,  Dolores Lory, MD as Consulting Physician (Vascular Surgery)   Assessment:   This is a routine wellness examination for [place].  Exercise Activities and Dietary recommendations Current Exercise Habits: Home exercise routine, Type of exercise: walking, Time (Minutes): 20, Frequency (Times/Week): 3, Weekly Exercise (Minutes/Week): 60, Intensity: Mild, Exercise limited by: None identified  Goals    . DIET - INCREASE WATER INTAKE     Recommend to drink at least 6-8 8oz glasses of water per day.       Fall Risk Fall Risk  10/04/2017 09/08/2017 07/06/2017 04/06/2017 10/04/2016  Falls in the past year? Yes No No No Yes  Number falls in past yr: 2 or more - - - 2 or more  Injury with Fall? No - - - No  Risk Factor Category  High Fall Risk - - - -  Risk for fall due to : History of fall(s);Impaired balance/gait;Impaired vision - - - -  Risk for fall due to: Comment slow gait, weak at times when walking, drags feet; wears glasses - - - -  Follow up Falls evaluation completed;Education provided;Falls prevention discussed - - - Falls prevention discussed   Is the patient's home free of loose throw rugs in walkways, pet beds, electrical cords, etc?   Yes Does the patient have any grab bars in the bathroom? Yes  Does the patient use a shower chair when bathing? Yes Does the patient have any stairs in or around the home? No If so, are there any handrails?  N/A Does the patient have adequate lighting?  Yes Does the patient use a  cane, walker or w/c? No Does the patient use of an elevated toilet seat? Yes  Timed Get Up and Go Performed: Yes. Pt ambulated 10 feet within 13 sec. Gait slow and drags feet. Did not use an assistive device. No intervention required at this time. Fall risk prevention has been discussed.  Community Resource Referral not required at this time.  Depression Screen PHQ 2/9 Scores 10/04/2017 10/04/2017 09/08/2017 04/06/2017  PHQ - 2 Score 0 0 0 0  PHQ- 9 Score 0 - - -    Cognitive Function     6CIT Screen 10/04/2017 10/04/2016  What Year? 0 points 0 points  What month? 0 points 0 points  What time? 3 points 0 points  Count back from 20 4 points 0 points  Months in reverse 4 points 4 points  Repeat phrase 10 points 8 points  Total Score 21 12    Immunization History  Administered Date(s) Administered  . Influenza, High Dose Seasonal PF 05/02/2014, 04/30/2015, 05/02/2017  . Influenza, Seasonal, Injecte, Preservative Fre 04/13/2012  . Influenza,inj,Quad PF,6+ Mos 05/03/2013  . Influenza-Unspecified 05/02/2014  . Pneumococcal Conjugate-13 06/13/2014  . Pneumococcal Polysaccharide-23 06/12/2013    Qualifies for Shingles Vaccine? Yes. Due for Zostavax or Shingrix vaccine. Education has been provided regarding the importance of this vaccine. Pt has been advised to call his insurance company to determine his out of pocket expense. Advised he may also receive this vaccine at his local pharmacy or Health Dept. Verbalized acceptance and understanding.  Due for Tdap vaccine. Education has been provided regarding the importance of this vaccine, however, pt has been advised that this vaccine is NOT covered by Medicare as a preventative vaccination. He is welcome to receive this vacine today but will be responsible for any out of pocket expenses incurred as a result of receiving this vaccine. Pt has been advised he may receive this vaccine at his  local pharmacy or Health Dept. Also advised to provide a copy of  his vaccination record if he chooses to receive this vaccine at his local pharmacy. Verbalized acceptance and understanding.  Screening Tests Health Maintenance  Topic Date Due  . FOOT EXAM  08/29/1939  . HEMOGLOBIN A1C  01/09/2016  . TETANUS/TDAP  09/08/2018 (Originally 08/28/1946)  . OPHTHALMOLOGY EXAM  03/17/2018  . INFLUENZA VACCINE  Completed  . PNA vac Low Risk Adult  Completed   Cancer Screenings: Lung: Low Dose CT Chest recommended if Age 80-80 years, 30 pack-year currently smoking OR have quit w/in 15years. Patient does not qualify due to age limitations. Colorectal: Completed colonoscopy 11/14/12. Colorectal screenings no longer required.  Additional Screenings: Hepatitis B/HIV/Syphillis: Does not qualify Hepatitis C Screening: Does not qualify  Diabetic Eye exam completed 03/17/17. States he has an appt with [facility] at the end of this month. Diabetic Foot exam is overdue.    Plan:  I have personally reviewed and addressed the Medicare Annual Wellness questionnaire and have noted the following in the patient's chart:  A. Medical and social history B. Use of alcohol, tobacco or illicit drugs  C. Current medications and supplements D. Functional ability and status E.  Nutritional status F.  Physical activity G. Advance directives H. List of other physicians I.  Hospitalizations, surgeries, and ER visits in previous 12 months J.  [place] such as hearing and vision if needed, cognitive and depression L. Referrals and appointments - none  In addition, I have reviewed and discussed with patient certain preventive protocols, quality metrics, and best practice recommendations. A written personalized care plan for preventive services as well as general preventive health recommendations were provided to patient.  See attached scanned questionnaire for additional information.   Signed,  Aleatha Borer, LPN Nurse Health Advisor  I have reviewed this  encounter including the documentation in this note and/or discussed this patient with the provider, Aleatha Borer, LPN. I am certifying that I agree with the content of this note as supervising physician.  [name], MD [facility] 10/04/2017, 1:14 PM

## 2017-10-05 LAB — COMPLETE METABOLIC PANEL WITH GFR
AG RATIO: 1.5 (calc) (ref 1.0–2.5)
ALT: 8 U/L — AB (ref 9–46)
AST: 13 U/L (ref 10–35)
Albumin: 4 g/dL (ref 3.6–5.1)
Alkaline phosphatase (APISO): 78 U/L (ref 40–115)
BUN: 14 mg/dL (ref 7–25)
CALCIUM: 9.7 mg/dL (ref 8.6–10.3)
CO2: 28 mmol/L (ref 20–32)
Chloride: 101 mmol/L (ref 98–110)
Creat: 0.93 mg/dL (ref 0.70–1.11)
GFR, EST AFRICAN AMERICAN: 85 mL/min/{1.73_m2} (ref >=60)
GFR, Est Non African American: 73 mL/min/{1.73_m2} (ref >=60)
GLUCOSE: 113 mg/dL — AB (ref 65–99)
Globulin: 2.7 g/dL (calc) (ref 1.9–3.7)
POTASSIUM: 4.3 mmol/L (ref 3.5–5.3)
Sodium: 138 mmol/L (ref 135–146)
TOTAL PROTEIN: 6.7 g/dL (ref 6.1–8.1)
Total Bilirubin: 0.8 mg/dL (ref 0.2–1.2)

## 2017-10-05 LAB — CBC WITH DIFFERENTIAL/PLATELET
BASOS PCT: 0.6 %
Basophils Absolute: 38 cells/uL (ref 0–200)
EOS ABS: 120 {cells}/uL (ref 15–500)
Eosinophils Relative: 1.9 %
HCT: 41.4 % (ref 38.5–50.0)
Hemoglobin: 14.3 g/dL (ref 13.2–17.1)
Lymphs Abs: 1386 cells/uL (ref 850–3900)
MCH: 31.9 pg (ref 27.0–33.0)
MCHC: 34.5 g/dL (ref 32.0–36.0)
MCV: 92.4 fL (ref 80.0–100.0)
MONOS PCT: 8.3 %
MPV: 10.9 fL (ref 7.5–12.5)
NEUTROS ABS: 4234 {cells}/uL (ref 1500–7800)
Neutrophils Relative %: 67.2 %
Platelets: 200 10*3/uL (ref 140–400)
RBC: 4.48 10*6/uL (ref 4.20–5.80)
RDW: 11.7 % (ref 11.0–15.0)
Total Lymphocyte: 22 %
WBC mixed population: 523 cells/uL (ref 200–950)
WBC: 6.3 10*3/uL (ref 3.8–10.8)

## 2017-10-05 LAB — LIPID PANEL
CHOL/HDL RATIO: 2.8 (calc) (ref <5.0)
Cholesterol: 111 mg/dL (ref <200)
HDL: 40 mg/dL — ABNORMAL LOW (ref >40)
LDL Cholesterol (Calc): 57 mg/dL (calc)
NON-HDL CHOLESTEROL (CALC): 71 mg/dL (ref <130)
Triglycerides: 63 mg/dL (ref <150)

## 2017-10-05 LAB — VITAMIN B12: Vitamin B-12: 276 pg/mL (ref 200–1100)

## 2017-10-05 LAB — HEMOGLOBIN A1C
HEMOGLOBIN A1C: 5.5 %{Hb} (ref <5.7)
Mean Plasma Glucose: 111 (calc)
eAG (mmol/L): 6.2 (calc)

## 2017-10-05 LAB — TSH: TSH: 1.82 mIU/L (ref 0.40–4.50)

## 2017-10-05 LAB — MICROALBUMIN / CREATININE URINE RATIO
Creatinine, Urine: 91 mg/dL (ref 20–320)
MICROALB UR: 0.7 mg/dL
MICROALB/CREAT RATIO: 8 ug/mg{creat} (ref <30)

## 2017-10-05 LAB — VITAMIN D 25 HYDROXY (VIT D DEFICIENCY, FRACTURES): Vit D, 25-Hydroxy: 45 ng/mL (ref 30–100)

## 2017-10-07 ENCOUNTER — Encounter: Payer: Self-pay | Admitting: Family Medicine

## 2017-10-07 NOTE — Telephone Encounter (Signed)
Erroneous Entry  

## 2017-10-11 ENCOUNTER — Encounter: Payer: Self-pay | Admitting: Family Medicine

## 2017-10-11 DIAGNOSIS — H35371 Puckering of macula, right eye: Secondary | ICD-10-CM | POA: Insufficient documentation

## 2017-10-13 ENCOUNTER — Encounter: Payer: Self-pay | Admitting: Family Medicine

## 2017-10-23 ENCOUNTER — Encounter: Payer: Self-pay | Admitting: Family Medicine

## 2017-10-24 ENCOUNTER — Other Ambulatory Visit: Payer: Self-pay | Admitting: Family Medicine

## 2017-10-24 ENCOUNTER — Encounter (INDEPENDENT_AMBULATORY_CARE_PROVIDER_SITE_OTHER): Payer: Self-pay | Admitting: Vascular Surgery

## 2017-10-24 ENCOUNTER — Ambulatory Visit (INDEPENDENT_AMBULATORY_CARE_PROVIDER_SITE_OTHER): Payer: Medicare HMO

## 2017-10-24 ENCOUNTER — Ambulatory Visit (INDEPENDENT_AMBULATORY_CARE_PROVIDER_SITE_OTHER): Payer: Medicare HMO | Admitting: Vascular Surgery

## 2017-10-24 VITALS — BP 110/62 | HR 74 | Resp 16 | Ht 67.0 in | Wt 167.0 lb

## 2017-10-24 DIAGNOSIS — I10 Essential (primary) hypertension: Secondary | ICD-10-CM

## 2017-10-24 DIAGNOSIS — I714 Abdominal aortic aneurysm, without rupture, unspecified: Secondary | ICD-10-CM

## 2017-10-24 DIAGNOSIS — M1991 Primary osteoarthritis, unspecified site: Secondary | ICD-10-CM | POA: Diagnosis not present

## 2017-10-24 DIAGNOSIS — I739 Peripheral vascular disease, unspecified: Secondary | ICD-10-CM

## 2017-10-24 NOTE — Progress Notes (Signed)
MRN : [id number]  Roy Hernandez is a 84 y.o. (Jul 12, 1928) male who presents with chief complaint of  Chief Complaint  Patient presents with  . Follow-up    86yr AAA and bil le arterial  .  History of Present Illness: The patient returns to the office for surveillance of a known abdominal aortic aneurysm. Patient denies abdominal pain or back pain, no other abdominal complaints. No changes suggesting embolic episodes.   There have been no interval changes in the patient's overall health care since his last visit.  Patient denies amaurosis fugax or TIA symptoms. There is no history of claudication or rest pain symptoms of the lower extremities. The patient denies angina or shortness of breath.     Current Meds  Medication Sig  . Alcohol Swabs (ALCOHOL PREP) PADS   . alendronate (FOSAMAX) 70 MG tablet Take 1 tablet (70 mg total) by mouth once a week. Take with a full glass of water on an empty stomach.  [name] aspirin 81 MG tablet Take 81 mg by mouth daily.   . cholecalciferol (VITAMIN D) 1000 UNITS tablet Take 1,000 Units by mouth daily.   . cyanocobalamin 500 MCG tablet Take 500 mcg by mouth daily.  [name] donepezil (ARICEPT) 5 MG tablet Take 1 tablet (5 mg total) by mouth at bedtime.  [name] latanoprost (XALATAN) 0.005 % ophthalmic solution Apply to eye.  . Menthol-Methyl Salicylate (MUSCLE RUB EX)   . Naproxen Sodium 220 MG CAPS Take 220 mg by mouth daily as needed.   . ONE TOUCH ULTRA TEST test strip TEST BLOOD GLUCOSE LEVEL ONCE A DAY.  [name] pseudoephedrine-acetaminophen (TYLENOL SINUS) 30-500 MG TABS tablet Take 1 tablet by mouth every 4 (four) hours as needed.  . simvastatin (ZOCOR) 5 MG tablet Take 1 tablet (5 mg total) by mouth at bedtime.  . timolol (TIMOPTIC) 0.5 % ophthalmic solution     Past Medical History:  Diagnosis Date  . AAA (abdominal aortic aneurysm) ([place])   . Glaucoma   . Hyperkalemia   . Hyperlipidemia   . Hypertension   . Peripheral vascular disease ([place])   . Vitamin D  deficiency     Past Surgical History:  Procedure Laterality Date  . CATARACT EXTRACTION    . TOOTH EXTRACTION      Social History Social History   Tobacco Use  . Smoking status: Former Smoker    Packs/day: 1.00    Years: 34.00    Pack years: 34.00    Types: Cigarettes    Start date: 08/02/1945    Last attempt to quit: 05/26/1980    Years since quitting: 37.4  . Smokeless tobacco: Never Used  . Tobacco comment: smoking cessation materials not required  Substance Use Topics  . Alcohol use: No    Alcohol/week: 0.0 oz  . Drug use: No    Family History Family History  Problem Relation Age of Onset  . Cancer Mother   . Hypertension Mother   . Diabetes Father   . Hyperlipidemia Father   . Diabetes Maternal Grandmother   . Diabetes Brother   . Kidney disease Brother        on dialysis  . Diabetes Sister   . Kidney disease Sister        dialysis  . Cancer Brother        esophageal  . Leukemia Sister     No Known Allergies   REVIEW OF SYSTEMS (Negative unless checked)  Constitutional: [] Weight loss  [] Fever  []   Chills Cardiac: [] Chest pain   [] Chest pressure   [] Palpitations   [] Shortness of breath when laying flat   [] Shortness of breath with exertion. Vascular:  [x] Pain in legs with walking   [] Pain in legs at rest  [] History of DVT   [] Phlebitis   [] Swelling in legs   [] Varicose veins   [] Non-healing ulcers Pulmonary:   [] Uses home oxygen   [] Productive cough   [] Hemoptysis   [] Wheeze  [] COPD   [] Asthma Neurologic:  [] Dizziness   [] Seizures   [] History of stroke   [] History of TIA  [] Aphasia   [] Vissual changes   [] Weakness or numbness in arm   [] Weakness or numbness in leg Musculoskeletal:   [] Joint swelling   [] Joint pain   [] Low back pain Hematologic:  [] Easy bruising  [] Easy bleeding   [] Hypercoagulable state   [] Anemic Gastrointestinal:  [] Diarrhea   [] Vomiting  [] Gastroesophageal reflux/heartburn   [] Difficulty swallowing. Genitourinary:  [] Chronic kidney  disease   [] Difficult urination  [] Frequent urination   [] Blood in urine Skin:  [] Rashes   [] Ulcers  Psychological:  [] History of anxiety   []  History of major depression.  Physical Examination  Vitals:   10/24/17 1010  BP: 110/62  Pulse: 74  Resp: 16  Weight: 167 lb (75.8 kg)  Height: 5\' 7"  (1.702 m)   Body mass index is 26.16 kg/m. Gen: WD/WN, NAD Head: [place]/AT, No temporalis wasting.  Ear/Nose/Throat: Hearing grossly intact, nares w/o erythema or drainage Eyes: PER, EOMI, sclera nonicteric.  Neck: Supple, no large masses.   Pulmonary:  Good air movement, no audible wheezing bilaterally, no use of accessory muscles.  Cardiac: RRR, no JVD Vascular:  Vessel Right Left  Radial Palpable Palpable  PT Not Palpable Not Palpable  DP Not Palpable Not Palpable  Gastrointestinal: Non-distended. No guarding/no peritoneal signs.  Musculoskeletal: M/S 5/5 throughout.  No deformity or atrophy.  Neurologic: CN 2-12 intact. Symmetrical.  Speech is fluent. Motor exam as listed above. Psychiatric: Judgment intact, Mood & affect appropriate for pt's clinical situation. Dermatologic: No rashes or ulcers noted.  No changes consistent with cellulitis. Lymph : No lichenification or skin changes of chronic lymphedema.  CBC Lab Results  Component Value Date   WBC 6.3 10/04/2017   HGB 14.3 10/04/2017   HCT 41.4 10/04/2017   MCV 92.4 10/04/2017   PLT 200 10/04/2017    BMET    Component Value Date/Time   NA 138 10/04/2017 1236   NA 142 01/08/2016 1504   K 4.3 10/04/2017 1236   CL 101 10/04/2017 1236   CO2 28 10/04/2017 1236   GLUCOSE 113 (H) 10/04/2017 1236   BUN 14 10/04/2017 1236   BUN 10 01/08/2016 1504   CREATININE 0.93 10/04/2017 1236   CALCIUM 9.7 10/04/2017 1236   GFRNONAA 73 10/04/2017 1236   GFRAA 85 10/04/2017 1236   Estimated Creatinine Clearance: 51.3 mL/min (by C-G formula based on SCr of 0.93 mg/dL).  COAG No results found for: INR, PROTIME  Radiology No results  found.    Assessment/Plan 1. AAA (abdominal aortic aneurysm) without rupture (HCC) No surgery or intervention at this time. The patient has an asymptomatic abdominal aortic aneurysm that is less than 4 cm in maximal diameter.  I have discussed the natural history of abdominal aortic aneurysm and the small risk of rupture for aneurysm less than 5 cm in size.  However, as these small aneurysms tend to enlarge over time, continued surveillance with ultrasound or CT scan is mandatory.  I have also  discussed optimizing medical management with hypertension and lipid control and the importance of abstinence from tobacco.  The patient is also encouraged to exercise a minimum of 30 minutes 4 times a week.  Should the patient develop new onset abdominal or back pain or signs of peripheral embolization they are instructed to seek medical attention immediately and to alert the physician providing care that they have an aneurysm.  The patient voices their understanding. The patient will return in 12 months with an aortic duplex.   2. PAD (peripheral artery disease) (HCC)  Recommend:  The patient has evidence of atherosclerosis of the lower extremities with claudication.  The patient does not voice lifestyle limiting changes at this point in time.  Noninvasive studies do not suggest clinically significant change.  No invasive studies, angiography or surgery at this time The patient should continue walking and begin a more formal exercise program.  The patient should continue antiplatelet therapy and aggressive treatment of the lipid abnormalities  No changes in the patient's medications at this time  The patient should continue wearing graduated compression socks 10-15 mmHg strength to control the mild edema.    3. Hypertension, benign Continue antihypertensive medications as already ordered, these medications have been reviewed and there are no changes at this time.   4. Primary osteoarthritis,  unspecified site Continue NSAID medications as already ordered, these medications have been reviewed and there are no changes at this time.  Continued activity and therapy was stressed.     Hortencia Pilar, MD  10/24/2017 9:13 PM

## 2017-10-27 ENCOUNTER — Ambulatory Visit
Admission: RE | Admit: 2017-10-27 | Discharge: 2017-10-27 | Disposition: A | Discharge status: Home / Self Care | Payer: Medicare HMO | Source: Ambulatory Visit | Attending: Family Medicine | Admitting: Family Medicine

## 2017-10-27 DIAGNOSIS — M810 Age-related osteoporosis without current pathological fracture: Secondary | ICD-10-CM | POA: Diagnosis not present

## 2017-11-01 ENCOUNTER — Other Ambulatory Visit: Payer: Self-pay | Admitting: Family Medicine

## 2017-11-01 DIAGNOSIS — G3184 Mild cognitive impairment, so stated: Secondary | ICD-10-CM

## 2017-11-01 NOTE — Telephone Encounter (Signed)
Refill request for general medication: Aricept 5 mg  Last office visit: 10/04/2017  Last physical exam: 10/04/2017  Follow-ups on file. 11/02/2017

## 2017-11-02 ENCOUNTER — Ambulatory Visit (INDEPENDENT_AMBULATORY_CARE_PROVIDER_SITE_OTHER): Payer: Medicare HMO | Admitting: Family Medicine

## 2017-11-02 ENCOUNTER — Encounter: Payer: Self-pay | Admitting: Family Medicine

## 2017-11-02 ENCOUNTER — Telehealth: Payer: Self-pay | Admitting: Family Medicine

## 2017-11-02 VITALS — BP 118/76 | HR 96 | Temp 97.5°F | Resp 18 | Ht 67.0 in | Wt 173.7 lb

## 2017-11-02 DIAGNOSIS — M810 Age-related osteoporosis without current pathological fracture: Secondary | ICD-10-CM | POA: Diagnosis not present

## 2017-11-02 DIAGNOSIS — J302 Other seasonal allergic rhinitis: Secondary | ICD-10-CM | POA: Diagnosis not present

## 2017-11-02 DIAGNOSIS — R4189 Other symptoms and signs involving cognitive functions and awareness: Secondary | ICD-10-CM | POA: Diagnosis not present

## 2017-11-02 DIAGNOSIS — J3089 Other allergic rhinitis: Secondary | ICD-10-CM

## 2017-11-02 DIAGNOSIS — E538 Deficiency of other specified B group vitamins: Secondary | ICD-10-CM

## 2017-11-02 DIAGNOSIS — I10 Essential (primary) hypertension: Secondary | ICD-10-CM | POA: Diagnosis not present

## 2017-11-02 MED ORDER — FLUTICASONE PROPIONATE 50 MCG/ACT NA SUSP
2.0000 | Freq: Every day | NASAL | 1 refills | Status: DC
Start: 2017-11-02 — End: 2019-04-12

## 2017-11-02 MED ORDER — DONEPEZIL HCL 10 MG PO TABS: 10.0000 mg | ORAL_TABLET | Freq: Every day | ORAL | 1 refills | Status: DC

## 2017-11-02 NOTE — Telephone Encounter (Signed)
errenous °

## 2017-11-02 NOTE — Progress Notes (Signed)
Name: Roy Hernandez   MRN: [id number]    DOB: 1928/02/01   Date:11/02/2017       Progress Note  Subjective  Chief Complaint  Chief Complaint  Patient presents with  . Diabetes  . Altered Mental Status    HPI  [name] is here with him today   HTN: off bp medication for the past month, bp is still towards low end of normal, but dizziness when he takes benadryl we will stop benadryl and give him nasal steroid for nasal congestion  Paresthesia: he has noticed numbness on both feet a few years ago, but getting worse. He was diagnosed with B12 deficiency and is taking B12 now   Severe decrease in cognitive function: still lives alone, came in today with his daughter - [name] - his CIT 6 has increased from 12 to 21 over the past year, MMS today was low at 62 . He still drives to grocery stores, and girlfriend's house and church. Not getting lost. His daughter is paying his bills ( since his wife died in Nov 01, 2010) He has episodes that he repeats himself. Discussed no longer going to the bank and only have cash for the week.   Osteoporosis with history of compression fractures spine: taking fosamax, discussed high calcium diet and continue vitamin D supplementation, osteoporosis is worse, we will refer him to endocrinologist for other options.    Patient Active Problem List   Diagnosis Date Noted  . Epiretinal membrane (ERM) of right eye 10/11/2017  . DJD (degenerative joint disease) 10/04/2017  . Hyperglycemia 10/04/2017  . Advanced stage glaucoma 10/04/2017  . Hypertension, benign 07/06/2017  . PAD (peripheral artery disease) ([place]) 10/19/2016  . Osteoporosis of femur without pathological fracture 11/25/2015  . AAA (abdominal aortic aneurysm) without rupture ([place]) 11/05/2014  . Abnormal ECG 11/05/2014  . At risk for falling 11/05/2014  . DD (diverticular disease) 11/05/2014  . Dyslipidemia 11/05/2014  . Failure of erection 11/05/2014  . Degenerative arthritis of  lumbar spine 11/05/2014  . Compressed spine fracture ([place]) 11/05/2014  . Basal cell papilloma 11/05/2014  . Vitamin D deficiency 11/05/2014    Past Surgical History:  Procedure Laterality Date  . CATARACT EXTRACTION    . TOOTH EXTRACTION      Family History  Problem Relation Age of Onset  . Cancer Mother   . Hypertension Mother   . Diabetes Father   . Hyperlipidemia Father   . Diabetes Maternal Grandmother   . Diabetes Brother   . Kidney disease Brother        on dialysis  . Diabetes Sister   . Kidney disease Sister        dialysis  . Cancer Brother        esophageal  . Leukemia Sister     Social History   Socioeconomic History  . Marital status: Widowed    Spouse name: [name]  . Number of children: 3  . Years of education: Not on file  . Highest education level: 9th grade  Occupational History    Employer: [employer]  Social Needs  . Financial resource strain: Not hard at all  . Food insecurity:    Worry: Never true    Inability: Never true  . Transportation needs:    Medical: No    Non-medical: No  Tobacco Use  . Smoking status: Former Smoker    Packs/day: 1.00    Years: 34.00    Pack years: 34.00  Types: Cigarettes    Start date: 08/02/1945    Last attempt to quit: 05/26/1980    Years since quitting: 37.4  . Smokeless tobacco: Never Used  . Tobacco comment: smoking cessation materials not required  Substance and Sexual Activity  . Alcohol use: No    Alcohol/week: 0.0 oz  . Drug use: No  . Sexual activity: Not Currently  Lifestyle  . Physical activity:    Days per week: 3 days    Minutes per session: 20 min  . Stress: Not at all  Relationships  . Social connections:    Talks on phone: Patient refused    Gets together: Patient refused    Attends religious service: Patient refused    Active member of club or organization: Patient refused    Attends meetings of clubs or organizations: Patient refused    Relationship status: Widowed  . Intimate  partner violence:    Fear of current or ex partner: No    Emotionally abused: No    Physically abused: No    Forced sexual activity: No  Other Topics Concern  . Not on file  Social History Narrative  . Not on file     Current Outpatient Medications:  .  Alcohol Swabs (ALCOHOL PREP) PADS, , Disp: , Rfl:  .  alendronate (FOSAMAX) 70 MG tablet, Take 1 tablet (70 mg total) by mouth once a week. Take with a full glass of water on an empty stomach., Disp: 12 tablet, Rfl: 3 .  aspirin 81 MG tablet, Take 81 mg by mouth daily. , Disp: , Rfl:  .  cholecalciferol (VITAMIN D) 1000 UNITS tablet, Take 1,000 Units by mouth daily. , Disp: , Rfl:  .  cyanocobalamin 500 MCG tablet, Take 500 mcg by mouth daily., Disp: , Rfl:  .  donepezil (ARICEPT) 5 MG tablet, Take 1 tablet (5 mg total) by mouth at bedtime., Disp: 30 tablet, Rfl: 0 .  latanoprost (XALATAN) 0.005 % ophthalmic solution, Apply to eye., Disp: , Rfl:  .  Menthol-Methyl Salicylate (MUSCLE RUB EX), , Disp: , Rfl:  .  Naproxen Sodium 220 MG CAPS, Take 220 mg by mouth daily as needed. , Disp: , Rfl:  .  ONE TOUCH ULTRA TEST test strip, TEST BLOOD GLUCOSE LEVEL ONCE A DAY., Disp: 90 each, Rfl: 1 .  pseudoephedrine-acetaminophen (TYLENOL SINUS) 30-500 MG TABS tablet, Take 1 tablet by mouth every 4 (four) hours as needed., Disp: , Rfl:  .  simvastatin (ZOCOR) 5 MG tablet, Take 1 tablet (5 mg total) by mouth at bedtime., Disp: 90 tablet, Rfl: 1 .  timolol (TIMOPTIC) 0.5 % ophthalmic solution, , Disp: , Rfl:   No Known Allergies   ROS  Constitutional: Negative for fever or weight change.  Respiratory: Negative for cough and shortness of breath.   Cardiovascular: Negative for chest pain or palpitations.  Gastrointestinal: Negative for abdominal pain, no bowel changes.  Musculoskeletal: Negative for gait problem or joint swelling.  Skin: Negative for rash.  Neurological: Negative for dizziness or headache.  No other specific complaints in a  complete review of systems (except as listed in HPI above).  Objective  Vitals:   11/02/17 1135  BP: 118/76  Pulse: 96  Resp: 18  Temp: (!) 97.5 F (36.4 C)  TempSrc: Oral  SpO2: 96%  Weight: 173 lb 11.2 oz (78.8 kg)  Height: 5\' 7"  (1.702 m)    Body mass index is 27.21 kg/m.  Physical Exam  Constitutional: Patient appears well-developed and  well-nourished.  No distress.  HEENT: head atraumatic, normocephalic, pupils equal and reactive to light,neck supple, throat within normal limits Cardiovascular: Normal rate, regular rhythm and normal heart sounds.  No murmur heard. No BLE edema. Pulmonary/Chest: Effort normal and breath sounds normal. No respiratory distress. Abdominal: Soft.  There is no tenderness. Skin: senile purpura Psychiatric: Patient has a normal mood and affect. behavior is normal. Judgment and thought content normal.  Recent Results (from the past 2160 hour(s))  TSH     Status: None   Collection Time: 10/04/17 12:36 PM  Result Value Ref Range   TSH 1.82 0.40 - 4.50 mIU/L  Vitamin B12     Status: None   Collection Time: 10/04/17 12:36 PM  Result Value Ref Range   Vitamin B-12 276 200 - 1,100 pg/mL    Comment: . Please Note: Although the reference range for vitamin B12 is [phone] pg/mL, it has been reported that between 5 and 10% of patients with values between 200 and 400 pg/mL may experience neuropsychiatric and hematologic abnormalities due to occult B12 deficiency; less than 1% of patients with values above 400 pg/mL will have symptoms. [name]   VITAMIN D 25 Hydroxy (Vit-D Deficiency, Fractures)     Status: None   Collection Time: 10/04/17 12:36 PM  Result Value Ref Range   Vit D, 25-Hydroxy 45 30 - 100 ng/mL    Comment: Vitamin D Status         25-OH Vitamin D: . Deficiency:                    <20 ng/mL Insufficiency:             20 - 29 ng/mL Optimal:                 > or = 30 ng/mL . For 25-OH Vitamin D testing on patients on   D2-supplementation and patients for whom quantitation  of D2 and D3 fractions is required, the QuestAssureD(TM) 25-OH VIT D, (D2,D3), LC/MS/MS is recommended: order  code 773-261-1510 (patients >33yrs). . For more information on this test, go to: http://education.questdiagnostics.com/faq/FAQ163 (This link is being provided for  informational/educational purposes only.)   Hemoglobin A1c     Status: None   Collection Time: 10/04/17 12:36 PM  Result Value Ref Range   Hgb A1c MFr Bld 5.5 <5.7 % of total Hgb    Comment: For the purpose of screening for the presence of diabetes: . <5.7%       Consistent with the absence of diabetes 5.7-6.4%    Consistent with increased risk for diabetes             (prediabetes) > or =6.5%  Consistent with diabetes . This assay result is consistent with a decreased risk of diabetes. . Currently, no consensus exists regarding use of hemoglobin A1c for diagnosis of diabetes in children. . According to American Diabetes Association (ADA) guidelines, hemoglobin A1c <7.0% represents optimal control in non-pregnant diabetic patients. Different metrics may apply to specific patient populations.  Standards of Medical Care in Diabetes(ADA). .    Mean Plasma Glucose 111 (calc)   eAG (mmol/L) 6.2 (calc)  Lipid panel     Status: Abnormal   Collection Time: 10/04/17 12:36 PM  Result Value Ref Range   Cholesterol 111 <200 mg/dL   HDL 40 (L) >40 mg/dL   Triglycerides 63 <150 mg/dL   LDL Cholesterol (Calc) 57 mg/dL (calc)    Comment: Reference range: <100 . Desirable  range <100 mg/dL for primary prevention;   <70 mg/dL for patients with CHD or diabetic patients  with > or = 2 CHD risk factors. [name] LDL-C is now calculated using the Martin-Hopkins  calculation, which is a validated novel method providing  better accuracy than the Friedewald equation in the  estimation of LDL-C.  Cresenciano Genre et al. Annamaria Helling. 4098;119(14): 2061-2068   (http://education.[facility].com/faq/FAQ164)    Total CHOL/HDL Ratio 2.8 <5.0 (calc)   Non-HDL Cholesterol (Calc) 71 <130 mg/dL (calc)    Comment: For patients with diabetes plus 1 major ASCVD risk  factor, treating to a non-HDL-C goal of <100 mg/dL  (LDL-C of <70 mg/dL) is considered a therapeutic  option.   Urine Microalbumin w/creat. ratio     Status: None   Collection Time: 10/04/17 12:36 PM  Result Value Ref Range   Creatinine, Urine 91 20 - 320 mg/dL   Microalb, Ur 0.7 mg/dL    Comment: Reference Range Not established    Microalb Creat Ratio 8 <30 mcg/mg creat    Comment: . The ADA defines abnormalities in albumin excretion as follows: [name] Category         Result (mcg/mg creatinine) . Normal                    <30 Microalbuminuria         30-299  Clinical albuminuria   > OR = 300 . The ADA recommends that at least two of three specimens collected within a 3-6 month period be abnormal before considering a patient to be within a diagnostic category.   COMPLETE METABOLIC PANEL WITH GFR     Status: Abnormal   Collection Time: 10/04/17 12:36 PM  Result Value Ref Range   Glucose, Bld 113 (H) 65 - 99 mg/dL    Comment: .            Fasting reference interval . For someone without known diabetes, a glucose value between 100 and 125 mg/dL is consistent with prediabetes and should be confirmed with a follow-up test. .    BUN 14 7 - 25 mg/dL   Creat 0.93 0.70 - 1.11 mg/dL    Comment: For patients >45 years of age, the reference limit for Creatinine is approximately 13% higher for people identified as African-American. .    GFR, Est Non African American 73 > OR = 60 mL/min/1.79m2   GFR, Est African American 85 > OR = 60 mL/min/1.32m2   BUN/Creatinine Ratio NOT APPLICABLE 6 - 22 (calc)   Sodium 138 135 - 146 mmol/L   Potassium 4.3 3.5 - 5.3 mmol/L   Chloride 101 98 - 110 mmol/L   CO2 28 20 - 32 mmol/L   Calcium 9.7 8.6 - 10.3 mg/dL   Total Protein 6.7 6.1 -  8.1 g/dL   Albumin 4.0 3.6 - 5.1 g/dL   Globulin 2.7 1.9 - 3.7 g/dL (calc)   AG Ratio 1.5 1.0 - 2.5 (calc)   Total Bilirubin 0.8 0.2 - 1.2 mg/dL   Alkaline phosphatase (APISO) 78 40 - 115 U/L   AST 13 10 - 35 U/L   ALT 8 (L) 9 - 46 U/L  CBC with Differential/Platelet     Status: None   Collection Time: 10/04/17 12:36 PM  Result Value Ref Range   WBC 6.3 3.8 - 10.8 Thousand/uL   RBC 4.48 4.20 - 5.80 Million/uL   Hemoglobin 14.3 13.2 - 17.1 g/dL   HCT 41.4 38.5 - 50.0 %  MCV 92.4 80.0 - 100.0 fL   MCH 31.9 27.0 - 33.0 pg   MCHC 34.5 32.0 - 36.0 g/dL   RDW 11.7 11.0 - 15.0 %   Platelets 200 140 - 400 Thousand/uL   MPV 10.9 7.5 - 12.5 fL   Neutro Abs 4,234 1,500 - 7,800 cells/uL   Lymphs Abs 1,386 850 - 3,900 cells/uL   WBC mixed population 523 200 - 950 cells/uL   Eosinophils Absolute 120 15 - 500 cells/uL   Basophils Absolute 38 0 - 200 cells/uL   Neutrophils Relative % 67.2 %   Total Lymphocyte 22.0 %   Monocytes Relative 8.3 %   Eosinophils Relative 1.9 %   Basophils Relative 0.6 %      PHQ2/9: Depression screen [facility] 2/9 10/04/2017 10/04/2017 09/08/2017 04/06/2017 10/04/2016  Decreased Interest 0 0 0 0 0  Down, Depressed, Hopeless 0 0 0 0 0  PHQ - 2 Score 0 0 0 0 0  Altered sleeping 0 - - - -  Tired, decreased energy 0 - - - -  Change in appetite 0 - - - -  Feeling bad or failure about yourself  0 - - - -  Trouble concentrating 0 - - - -  Moving slowly or fidgety/restless 0 - - - -  Suicidal thoughts 0 - - - -  PHQ-9 Score 0 - - - -  Difficult doing work/chores Not difficult at all - - - -     Fall Risk: Fall Risk  10/04/2017 09/08/2017 07/06/2017 04/06/2017 10/04/2016  Falls in the past year? Yes No No No Yes  Number falls in past yr: 2 or more - - - 2 or more  Injury with Fall? No - - - No  Risk Factor Category  High Fall Risk - - - -  Risk for fall due to : History of fall(s);Impaired balance/gait;Impaired vision - - - -  Risk for fall due to: Comment slow gait, weak at  times when walking, drags feet; wears glasses - - - -  Follow up Falls evaluation completed;Education provided;Falls prevention discussed - - - Falls prevention discussed      Assessment & Plan  1. Persistent cognitive impairment  Daughter is here with him again, we will increase dose of Aricept from 5mg  to 10 mg and monitor  - donepezil (ARICEPT) 10 MG tablet; Take 1 tablet (10 mg total) by mouth at bedtime.  Dispense: 90 tablet; Refill: 1  2. Osteoporosis of femur without pathological fracture  - Ambulatory referral to Endocrinology  3. Perennial allergic rhinitis with seasonal variation  - fluticasone (FLONASE) 50 MCG/ACT nasal spray; Place 2 sprays into both nostrils daily.  Dispense: 48 g; Refill: 1  4. B12 deficiency  Taking otc B12

## 2017-11-03 ENCOUNTER — Other Ambulatory Visit: Payer: Self-pay | Admitting: Family Medicine

## 2017-11-03 ENCOUNTER — Encounter: Payer: Self-pay | Admitting: Family Medicine

## 2017-11-03 MED ORDER — DONEPEZIL HCL 10 MG PO TABS
10.0000 mg | ORAL_TABLET | Freq: Every day | ORAL | 0 refills | Status: DC
Start: 2017-11-03 — End: 2017-12-15

## 2017-11-03 NOTE — Telephone Encounter (Signed)
Copied from [place] [phone]. Topic: Quick Communication - Rx Refill/Question >> Nov 03, 2017 12:55 PM Ether Griffins B wrote: Medication: donepezil (ARICEPT) 10 MG tablet.   Pt is out of medication.  The medication  was sent to [facility] yesterday but they are also requesting a 10-14 day supply be called in to [facility] so he doesn't have to be with out his medication. They are hoping this will be called in today. Pt was seen in office on 11/02/17.   Preferred Pharmacy (with phone number or street name): [id number] [place], [place] - [address] Agent: Please be advised that RX refills may take up to 3 business days. We ask that you follow-up with your pharmacy.

## 2017-11-04 ENCOUNTER — Encounter: Payer: Self-pay | Admitting: Family Medicine

## 2017-11-16 ENCOUNTER — Encounter: Payer: Self-pay | Admitting: Family Medicine

## 2017-11-17 ENCOUNTER — Ambulatory Visit: Payer: Self-pay | Admitting: *Deleted

## 2017-11-17 ENCOUNTER — Encounter: Payer: Self-pay | Admitting: *Deleted

## 2017-11-17 DIAGNOSIS — K4090 Unilateral inguinal hernia, without obstruction or gangrene, not specified as recurrent: Secondary | ICD-10-CM | POA: Diagnosis not present

## 2017-11-17 NOTE — Telephone Encounter (Signed)
No  Availability  Today  Or  tomorrow    At   [facility]. Mellisa  At  [facility]  No  Openings .   Patient  Advised  To go  To an  Urgent  Care. Patient  Daughter  States  She  Will take  Mr  Garringer   To  [facility]  Today    Reason for Disposition . [1] Scrotum swelling AND [2] no pain  Answer Assessment - Initial Assessment Questions 1. SCROTAL SWELLING: "What does the scrotum look like?" "How swollen is it?" (mild, moderate severe; compare to other side)    Swelling to  The  Right  Of  His  Genitals    And  Scrotal  Area    2. LOCATION: "Where is the swelling located?"      r  Inguinal  Area   And  Scrotum     3. ONSET: "When did the swelling start?"       1 month  Got  Worse sev  Days  Ago   4. PATTERN: "Does it come and go, or has it been constant since it started?"        Comes  And  Goes    5. SCROTAL PAIN: "Is there any pain?" If so, ask: "How bad is it?"  (Scale 1-10; or mild, moderate, severe)       Mild    6. HERNIA: "Has a doctor ever told you that you have a hernia?"      no 7. OTHER SYMPTOMS: "Do you have any other symptoms?" (e.g., fever, abdominal pain, vomiting, difficulty passing urine)        No   Unusual  Symptoms  Protocols used: SCROTUM SWELLING-A-AH

## 2017-11-22 ENCOUNTER — Ambulatory Visit: Payer: Medicare HMO | Admitting: Family Medicine

## 2017-11-26 ENCOUNTER — Other Ambulatory Visit: Payer: Self-pay | Admitting: Family Medicine

## 2017-11-30 DIAGNOSIS — E113293 Type 2 diabetes mellitus with mild nonproliferative diabetic retinopathy without macular edema, bilateral: Secondary | ICD-10-CM | POA: Diagnosis not present

## 2017-12-15 ENCOUNTER — Ambulatory Visit: Payer: Medicare HMO | Admitting: General Surgery

## 2017-12-15 ENCOUNTER — Encounter: Payer: Self-pay | Admitting: General Surgery

## 2017-12-15 VITALS — BP 144/76 | HR 78 | Resp 12 | Ht 67.0 in | Wt 164.0 lb

## 2017-12-15 DIAGNOSIS — K4090 Unilateral inguinal hernia, without obstruction or gangrene, not specified as recurrent: Secondary | ICD-10-CM

## 2017-12-15 NOTE — Progress Notes (Signed)
Patient ID: Roy Hernandez, male   DOB: 1927/11/27, 84 y.o.   MRN: [id number]  Chief Complaint  Patient presents with  . Hernia    HPI Roy Hernandez is a 84 y.o. male.  Here for evaluation of a right inguinal hernia referred by Urgent Care, Dr Vickki Muff. He states he can feel a "stinging" sensation on and off for about a year. He says there is a soft spot. He walks about 2-3 times a week at the mall. Before he hurt his back he would walk 5 days a week. Denies any GI issues. Bowels move every other day, no bleeding. He does admit to occasional loose BM's.  He has some dementia and lives alone. His son-in-law wants to come back for discussion, Roy Hernandez.  HPI  Past Medical History:  Diagnosis Date  . AAA (abdominal aortic aneurysm) ([place])   . Dementia   . Diabetes mellitus without complication ([facility])    diet controlled  . Glaucoma   . Hyperkalemia   . Hyperlipidemia   . Hypertension   . Osteoporosis   . Peripheral vascular disease ([place])   . Vitamin D deficiency     Past Surgical History:  Procedure Laterality Date  . CATARACT EXTRACTION    . TOOTH EXTRACTION      Family History  Problem Relation Age of Onset  . Cancer Mother   . Hypertension Mother   . Diabetes Father   . Hyperlipidemia Father   . Diabetes Maternal Grandmother   . Diabetes Brother   . Kidney disease Brother        on dialysis  . Diabetes Sister   . Kidney disease Sister        dialysis  . Cancer Brother        esophageal  . Leukemia Sister     Social History Social History   Tobacco Use  . Smoking status: Former Smoker    Packs/day: 1.00    Years: 34.00    Pack years: 34.00    Types: Cigarettes    Start date: 08/02/1945    Last attempt to quit: 05/26/1980    Years since quitting: 37.5  . Smokeless tobacco: Never Used  . Tobacco comment: smoking cessation materials not required  Substance Use Topics  . Alcohol use: No    Alcohol/week: 0.0 oz  . Drug use: No    No Known  Allergies  Current Outpatient Medications  Medication Sig Dispense Refill  . Alcohol Swabs (ALCOHOL PREP) PADS     . alendronate (FOSAMAX) 70 MG tablet Take 1 tablet (70 mg total) by mouth once a week. Take with a full glass of water on an empty stomach. 12 tablet 3  . aspirin 81 MG tablet Take 81 mg by mouth daily.     . Carboxymethylcellulose Sodium (LUBRICANT EYE DROPS PF OP)     . cholecalciferol (VITAMIN D) 1000 UNITS tablet Take 1,000 Units by mouth daily.     . cyanocobalamin 500 MCG tablet Take 500 mcg by mouth daily.    [name] DM-Phenylephrine-Acetaminophen 10-5-325 MG CAPS     . donepezil (ARICEPT) 10 MG tablet Take 1 tablet (10 mg total) by mouth at bedtime. 90 tablet 1  . fluticasone (FLONASE) 50 MCG/ACT nasal spray Place 2 sprays into both nostrils daily. 48 g 1  . latanoprost (XALATAN) 0.005 % ophthalmic solution Apply to eye.    . Menthol-Methyl Salicylate (MUSCLE RUB EX)     . Naproxen Sodium 220 MG CAPS  Take 220 mg by mouth daily as needed.     . ONE TOUCH ULTRA TEST test strip TEST BLOOD GLUCOSE LEVEL ONCE A DAY. 90 each 1  . pseudoephedrine-acetaminophen (TYLENOL SINUS) 30-500 MG TABS tablet Take 1 tablet by mouth every 4 (four) hours as needed.    . simvastatin (ZOCOR) 5 MG tablet Take 1 tablet (5 mg total) by mouth at bedtime. 90 tablet 1  . timolol (TIMOPTIC) 0.5 % ophthalmic solution      No current facility-administered medications for this visit.     Review of Systems Review of Systems  Constitutional: Negative.   Respiratory: Negative.   Cardiovascular: Negative.   Gastrointestinal: Negative for constipation and diarrhea.    Blood pressure (!) 144/76, pulse 78, resp. rate 12, height 5\' 7"  (1.702 m), weight 164 lb (74.4 kg), SpO2 96 %.  Physical Exam Physical Exam  Constitutional: He is oriented to person, place, and time. He appears well-developed and well-nourished.  HENT:  Mouth/Throat: Oropharynx is clear and moist.  Eyes: Conjunctivae are normal. No  scleral icterus.  Neck: Neck supple.  Cardiovascular: Normal rate, regular rhythm and normal heart sounds.  Pulmonary/Chest: Effort normal and breath sounds normal.  Abdominal: Soft. A hernia is present. Hernia confirmed positive in the right inguinal area.  Genitourinary: Testes normal.     Genitourinary Comments: reducible medium size right inguinal hernia   Neurological: He is alert and oriented to person, place, and time.  Skin: Skin is warm and dry.  Psychiatric: His behavior is normal.    Data Reviewed Laboratory studies dated October 04, 2017 were reviewed.  CBC showed a hemoglobin of 14.3 with an MCV of 92, white blood cell count 6300.  Normal differential.  Platelet count 200,000. Comprehensive metabolic panel notable for modest elevation in the serum glucose, nonfasting.  Assessment    Symptomatic right inguinal hernia.  The patient is undecided if he wants to proceed with surgery.  He does have mild dementia, he lives alone and is quite active.    Plan    Hernia precautions and incarceration were discussed with the patient. If they develop symptoms of an incarcerated hernia, they were encouraged to seek prompt medical attention.  I have recommended repair of the hernia using mesh on an outpatient basis in the near future. The risk of infection was reviewed. The role of prosthetic mesh to minimize the risk of recurrence was reviewed.  His hernia size is such that he is a small but real risk of incarceration.  He is undecided if he wants to proceed with elective surgery.  He will call back when he decides to have surgery.     HPI, Physical Exam, Assessment and Plan have been scribed under the direction and in the presence of Robert Bellow, MD. Karie Fetch, RN  I have completed the exam and reviewed the above documentation for accuracy and completeness.  I agree with the above.  Haematologist has been used and any errors in dictation or transcription are  unintentional.  Roy Hernandez, M.D., F.A.C.S.   Forest Gleason Demarquis Osley 12/16/2017, 9:43 PM  The patient will decide if he wishes to proceed with hernia surgery and notify the office if he does. Patient and his son in law have been provided with Pre-admission paperwork today. Instructions were reviewed. It is okay for patient to continue an 81 mg aspirin once daily.   Dominga Ferry, CMA

## 2017-12-15 NOTE — Patient Instructions (Addendum)
The patient is aware to call back for any questions or concerns. He will call back when he decides to have surgery  Inguinal Hernia, Adult An inguinal hernia is when fat or the intestines push through the area where the leg meets the lower belly (groin) and make a rounded lump (bulge). This condition happens over time. There are three types of inguinal hernias. These types include:  Hernias that can be pushed back into the belly (are reducible).  Hernias that cannot be pushed back into the belly (are incarcerated).  Hernias that cannot be pushed back into the belly and lose their blood supply (get strangulated). This type needs emergency surgery.  Follow these instructions at home: Lifestyle  Drink enough fluid to keep your urine (pee) clear or pale yellow.  Eat plenty of fruits, vegetables, and whole grains. These have a lot of fiber. Talk with your doctor if you have questions.  Avoid lifting heavy objects.  Avoid standing for long periods of time.  Do not use tobacco products. These include cigarettes, chewing tobacco, or e-cigarettes. If you need help quitting, ask your doctor.  Try to stay at a healthy weight. General instructions  Do not try to force the hernia back in.  Watch your hernia for any changes in color or size. Let your doctor know if there are any changes.  Take over-the-counter and prescription medicines only as told by your doctor.  Keep all follow-up visits as told by your doctor. This is important. Contact a doctor if:  You have a fever.  You have new symptoms.  Your symptoms get worse. Get help right away if:  The area where the legs meets the lower belly has: ? Pain that gets worse suddenly. ? A bulge that gets bigger suddenly and does not go down. ? A bulge that turns red or purple. ? A bulge that is painful to the touch.  You are a man and your scrotum: ? Suddenly feels painful. ? Suddenly changes in size.  You feel sick to your stomach  (nauseous) and this feeling does not go away.  You throw up (vomit) and this keeps happening.  You feel your heart beating a lot more quickly than normal.  You cannot poop (have a bowel movement) or pass gas. This information is not intended to replace advice given to you by your health care provider. Make sure you discuss any questions you have with your health care provider. Document Released: 08/19/2006 Document Revised: 12/25/2015 Document Reviewed: 05/29/2014 Elsevier Interactive Patient Education  2018 [employer].

## 2017-12-16 DIAGNOSIS — Z9889 Other specified postprocedural states: Secondary | ICD-10-CM

## 2017-12-16 DIAGNOSIS — Z8719 Personal history of other diseases of the digestive system: Secondary | ICD-10-CM | POA: Insufficient documentation

## 2017-12-29 ENCOUNTER — Encounter: Payer: Self-pay | Admitting: *Deleted

## 2017-12-29 NOTE — Progress Notes (Signed)
Patient's surgery has been scheduled for 01-27-18 at [facility].   Per patient's POA and daughter, Roy Hernandez, they have arranged for someone to stay with the patient the evening of surgery.   It is okay for patient to continue an 81 mg aspirin once daily.   Daughter has been notified of Pre-admission appointment date and time.   Patient will not require a pre-op visit with Dr. Bary Castilla prior to surgery. History and physical will be updated the morning of procedure.

## 2017-12-31 HISTORY — PX: HERNIA REPAIR: SHX51

## 2018-01-17 ENCOUNTER — Other Ambulatory Visit: Payer: Self-pay | Admitting: General Surgery

## 2018-01-17 DIAGNOSIS — K4090 Unilateral inguinal hernia, without obstruction or gangrene, not specified as recurrent: Secondary | ICD-10-CM

## 2018-01-20 ENCOUNTER — Encounter
Admission: RE | Admit: 2018-01-20 | Discharge: 2018-01-20 | Disposition: A | Discharge status: Home / Self Care | Payer: Medicare HMO | Source: Ambulatory Visit | Attending: General Surgery | Admitting: General Surgery

## 2018-01-20 ENCOUNTER — Other Ambulatory Visit: Payer: Self-pay

## 2018-01-20 DIAGNOSIS — I447 Left bundle-branch block, unspecified: Secondary | ICD-10-CM | POA: Diagnosis not present

## 2018-01-20 DIAGNOSIS — Z01812 Encounter for preprocedural laboratory examination: Secondary | ICD-10-CM | POA: Diagnosis present

## 2018-01-20 DIAGNOSIS — I10 Essential (primary) hypertension: Secondary | ICD-10-CM | POA: Insufficient documentation

## 2018-01-20 DIAGNOSIS — E119 Type 2 diabetes mellitus without complications: Secondary | ICD-10-CM | POA: Diagnosis not present

## 2018-01-20 DIAGNOSIS — Z01810 Encounter for preprocedural cardiovascular examination: Secondary | ICD-10-CM | POA: Diagnosis present

## 2018-01-20 LAB — BASIC METABOLIC PANEL
ANION GAP: 8 (ref 5–15)
BUN: 14 mg/dL (ref 6–20)
CO2: 27 mmol/L (ref 22–32)
Calcium: 8.9 mg/dL (ref 8.9–10.3)
Chloride: 103 mmol/L (ref 101–111)
Creatinine, Ser: 0.79 mg/dL (ref 0.61–1.24)
GFR calc Af Amer: >60 mL/min (ref >60)
GLUCOSE: 103 mg/dL — AB (ref 65–99)
Potassium: 4 mmol/L (ref 3.5–5.1)
SODIUM: 138 mmol/L (ref 135–145)

## 2018-01-20 LAB — CBC
HCT: 39.1 % — ABNORMAL LOW (ref 40.0–52.0)
HEMOGLOBIN: 13.5 g/dL (ref 13.0–18.0)
MCH: 32.7 pg (ref 26.0–34.0)
MCHC: 34.5 g/dL (ref 32.0–36.0)
MCV: 94.7 fL (ref 80.0–100.0)
Platelets: 151 10*3/uL (ref 150–440)
RBC: 4.13 MIL/uL — ABNORMAL LOW (ref 4.40–5.90)
RDW: 13 % (ref 11.5–14.5)
WBC: 5.6 10*3/uL (ref 3.8–10.6)

## 2018-01-20 NOTE — Patient Instructions (Signed)
Your procedure is scheduled on: Friday, January 27, 2018 Report to Day Surgery on the 2nd floor of the [facility]. To find out your arrival time, please call [phone] between 1PM - 3PM on: Thursday, January 26, 2018  REMEMBER: Instructions that are not followed completely may result in serious medical risk, up to and including death; or upon the discretion of your surgeon and anesthesiologist your surgery may need to be rescheduled.  Do not eat food after midnight the night before your procedure.  No gum chewing, lozengers or hard candies.  You may however, drink CLEAR liquids up to 2 hours before you are scheduled to arrive for your surgery. Do not drink anything within 2 hours of the start of your surgery.  Clear liquids include: - water  - apple juice without pulp - clear gatorade - black coffee or tea (Do NOT add anything to the coffee or tea) Do NOT drink anything that is not on this list.  No Alcohol for 24 hours before or after surgery.  No Smoking including e-cigarettes for 24 hours prior to surgery.  No chewable tobacco products for at least 6 hours prior to surgery.  No nicotine patches on the day of surgery.  On the morning of surgery brush your teeth with toothpaste and water, you may rinse your mouth with mouthwash if you wish. Do not swallow any toothpaste or mouthwash.  Notify your doctor if there is any change in your medical condition (cold, fever, infection).  Do not wear jewelry, make-up, hairpins, clips or nail polish.  Do not wear lotions, powders, or perfumes. You may wear deodorant.  Do not shave 48 hours prior to surgery. Men may shave face and neck.  Contacts and dentures may not be worn into surgery.  Do not bring valuables to the hospital, including drivers license, insurance or credit cards.   is not responsible for any belongings or valuables.   TAKE THESE MEDICATIONS THE MORNING OF SURGERY:  1.  Timolol eye drop  Use CHG Soap as  directed on instruction sheet.  NOW!  Stop Anti-inflammatories (NSAIDS) such as Advil, Aleve, Ibuprofen, Motrin, Naproxen, Naprosyn and Aspirin based products such as Excedrin, Goodys Powder, BC Powder. (May take Tylenol or Acetaminophen if needed.)  NOW!  Stop ANY OVER THE COUNTER supplements until after surgery. (May continue Vitamin D, Vitamin B.)  Wear comfortable clothing (specific to your surgery type) to the hospital.  Plan for stool softeners for home use.  If you are being discharged the day of surgery, you will not be allowed to drive home. You will need a responsible adult to drive you home and stay with you that night.   If you are taking public transportation, you will need to have a responsible adult with you. Please confirm with your physician that it is acceptable to use public transportation.   Please call [phone] if you have any questions about these instructions.

## 2018-01-23 NOTE — Pre-Procedure Instructions (Addendum)
CLEARANCE REQUEST/ EKG CALLED AND FAXED TO DR Jerrell Belfast. ALSO FAXED TO DR Bary Castilla. SPOKE WITH TIFFANY AT DR Ancil Boozer. SPOKE WITH CAROLYN AT DR Dwyane Luo

## 2018-01-24 ENCOUNTER — Telehealth: Payer: Self-pay

## 2018-01-24 ENCOUNTER — Other Ambulatory Visit: Payer: Self-pay | Admitting: Family Medicine

## 2018-01-24 DIAGNOSIS — Z01818 Encounter for other preprocedural examination: Secondary | ICD-10-CM

## 2018-01-24 NOTE — Telephone Encounter (Signed)
Received a call from Iatan at [facility]. They will need the patient to have clearance due to and abnormal EKG. Roy Hernandez his PCP states that he needs seen by cardiology for clearance. The patient has seen Roy Hernandez in the past. I spoke with their office and they will see the patient tomorrow 01/25/18 at 11:00 am. If he needs additional testing after being seen his surgery will need to be postponed until he receives clearance.  I spoke with his daughter and POA, Roy Hernandez, and she is aware of this and will have the patient at [facility] office tomorrow. She is aware that the surgery may need to be delayed if additional testing is needed by Roy Hernandez.

## 2018-01-24 NOTE — Telephone Encounter (Signed)
Copied from [place] [id number]. Topic: General - Other >> Jan 24, 2018  8:41 AM Synthia Innocent wrote: Reason for CRM: [facility] Faxed over preadmission testing form, patient no longer needs cardiology eval, patient is set up for tomorrow

## 2018-01-24 NOTE — Pre-Procedure Instructions (Addendum)
SPOKE [place] nd faxed request to have their office to set up cardiology appt. Faxed fyi to DR BYRNETT/ NOT DR HOOTEN. SPOKE WITH CAROLYN IN DR [facility] OFFICE

## 2018-01-25 NOTE — Pre-Procedure Instructions (Signed)
CLEARED MILD TO MOD RISK BY DR CALLWOOD 01/25/18 AND NOTIFIED DR Dwyane Luo OFFICE

## 2018-01-26 ENCOUNTER — Encounter: Payer: Self-pay | Admitting: *Deleted

## 2018-01-27 ENCOUNTER — Other Ambulatory Visit: Payer: Self-pay

## 2018-01-27 ENCOUNTER — Ambulatory Visit
Admission: RE | Admit: 2018-01-27 | Discharge: 2018-01-27 | Disposition: A | Discharge status: Home / Self Care | Payer: Medicare HMO | Source: Ambulatory Visit | Attending: General Surgery | Admitting: General Surgery

## 2018-01-27 ENCOUNTER — Encounter: Payer: Self-pay | Admitting: *Deleted

## 2018-01-27 ENCOUNTER — Ambulatory Visit: Payer: Medicare HMO | Admitting: Anesthesiology

## 2018-01-27 ENCOUNTER — Encounter
Admission: RE | Disposition: A | Discharge status: Home / Self Care | Payer: Self-pay | Source: Ambulatory Visit | Attending: General Surgery

## 2018-01-27 DIAGNOSIS — E559 Vitamin D deficiency, unspecified: Secondary | ICD-10-CM | POA: Insufficient documentation

## 2018-01-27 DIAGNOSIS — M810 Age-related osteoporosis without current pathological fracture: Secondary | ICD-10-CM | POA: Insufficient documentation

## 2018-01-27 DIAGNOSIS — E785 Hyperlipidemia, unspecified: Secondary | ICD-10-CM | POA: Insufficient documentation

## 2018-01-27 DIAGNOSIS — Z7982 Long term (current) use of aspirin: Secondary | ICD-10-CM | POA: Insufficient documentation

## 2018-01-27 DIAGNOSIS — I10 Essential (primary) hypertension: Secondary | ICD-10-CM | POA: Insufficient documentation

## 2018-01-27 DIAGNOSIS — Z87891 Personal history of nicotine dependence: Secondary | ICD-10-CM | POA: Insufficient documentation

## 2018-01-27 DIAGNOSIS — Z7951 Long term (current) use of inhaled steroids: Secondary | ICD-10-CM | POA: Insufficient documentation

## 2018-01-27 DIAGNOSIS — I714 Abdominal aortic aneurysm, without rupture: Secondary | ICD-10-CM | POA: Diagnosis not present

## 2018-01-27 DIAGNOSIS — F0390 Unspecified dementia without behavioral disturbance: Secondary | ICD-10-CM | POA: Insufficient documentation

## 2018-01-27 DIAGNOSIS — Z7983 Long term (current) use of bisphosphonates: Secondary | ICD-10-CM | POA: Diagnosis not present

## 2018-01-27 DIAGNOSIS — H409 Unspecified glaucoma: Secondary | ICD-10-CM | POA: Diagnosis not present

## 2018-01-27 DIAGNOSIS — E1151 Type 2 diabetes mellitus with diabetic peripheral angiopathy without gangrene: Secondary | ICD-10-CM | POA: Insufficient documentation

## 2018-01-27 DIAGNOSIS — K4090 Unilateral inguinal hernia, without obstruction or gangrene, not specified as recurrent: Secondary | ICD-10-CM | POA: Diagnosis not present

## 2018-01-27 DIAGNOSIS — Z79899 Other long term (current) drug therapy: Secondary | ICD-10-CM | POA: Insufficient documentation

## 2018-01-27 HISTORY — DX: Unilateral inguinal hernia, without obstruction or gangrene, not specified as recurrent: K40.90

## 2018-01-27 HISTORY — PX: INGUINAL HERNIA REPAIR: SHX194

## 2018-01-27 LAB — GLUCOSE, CAPILLARY: Glucose-Capillary: 98 mg/dL (ref 70–99)

## 2018-01-27 SURGERY — REPAIR, HERNIA, INGUINAL, ADULT
Anesthesia: General | Laterality: Right | Wound class: Clean

## 2018-01-27 MED ORDER — KETOROLAC TROMETHAMINE 30 MG/ML IJ SOLN
INTRAMUSCULAR | Status: AC
  Filled 2018-01-27: qty 1

## 2018-01-27 MED ORDER — FENTANYL CITRATE (PF) 100 MCG/2ML IJ SOLN: 25.0000 ug | INTRAMUSCULAR | Status: DC | PRN

## 2018-01-27 MED ORDER — ACETAMINOPHEN 10 MG/ML IV SOLN
INTRAVENOUS | Status: DC | PRN
  Administered 2018-01-27: 1000 mg via INTRAVENOUS

## 2018-01-27 MED ORDER — HYDROCODONE-ACETAMINOPHEN 5-325 MG PO TABS: 1.0000 | ORAL_TABLET | Freq: Four times a day (QID) | ORAL | 0 refills | Status: DC | PRN

## 2018-01-27 MED ORDER — PROPOFOL 10 MG/ML IV BOLUS
INTRAVENOUS | Status: DC | PRN
  Administered 2018-01-27: 120 mg via INTRAVENOUS

## 2018-01-27 MED ORDER — SODIUM CHLORIDE 0.9 % IV SOLN
INTRAVENOUS | Status: DC
  Administered 2018-01-27: 07:00:00 via INTRAVENOUS

## 2018-01-27 MED ORDER — LIDOCAINE HCL (CARDIAC) PF 100 MG/5ML IV SOSY
PREFILLED_SYRINGE | INTRAVENOUS | Status: DC | PRN
  Administered 2018-01-27: 100 mg via INTRAVENOUS

## 2018-01-27 MED ORDER — ONDANSETRON HCL 4 MG/2ML IJ SOLN
INTRAMUSCULAR | Status: AC
  Filled 2018-01-27: qty 2

## 2018-01-27 MED ORDER — ONDANSETRON HCL 4 MG/2ML IJ SOLN: 4.0000 mg | Freq: Once | INTRAMUSCULAR | Status: DC | PRN

## 2018-01-27 MED ORDER — EPHEDRINE SULFATE 50 MG/ML IJ SOLN
INTRAMUSCULAR | Status: DC | PRN
  Administered 2018-01-27: 5 mg via INTRAVENOUS

## 2018-01-27 MED ORDER — MIDAZOLAM HCL 2 MG/2ML IJ SOLN
INTRAMUSCULAR | Status: AC
  Filled 2018-01-27: qty 2

## 2018-01-27 MED ORDER — LIDOCAINE HCL (PF) 2 % IJ SOLN
INTRAMUSCULAR | Status: AC
  Filled 2018-01-27: qty 10

## 2018-01-27 MED ORDER — ACETAMINOPHEN 10 MG/ML IV SOLN
INTRAVENOUS | Status: AC
  Filled 2018-01-27: qty 100

## 2018-01-27 MED ORDER — PROPOFOL 10 MG/ML IV BOLUS
INTRAVENOUS | Status: AC
  Filled 2018-01-27: qty 20

## 2018-01-27 MED ORDER — CEFAZOLIN SODIUM-DEXTROSE 2-4 GM/100ML-% IV SOLN
2.0000 g | INTRAVENOUS | Status: AC
  Administered 2018-01-27: 2 g via INTRAVENOUS

## 2018-01-27 MED ORDER — CEFAZOLIN SODIUM-DEXTROSE 2-4 GM/100ML-% IV SOLN
INTRAVENOUS | Status: AC
  Filled 2018-01-27: qty 100

## 2018-01-27 MED ORDER — PHENYLEPHRINE HCL 10 MG/ML IJ SOLN
INTRAMUSCULAR | Status: DC | PRN
  Administered 2018-01-27: 100 ug via INTRAVENOUS

## 2018-01-27 MED ORDER — FENTANYL CITRATE (PF) 100 MCG/2ML IJ SOLN
INTRAMUSCULAR | Status: AC
  Filled 2018-01-27: qty 2

## 2018-01-27 MED ORDER — DEXAMETHASONE SODIUM PHOSPHATE 10 MG/ML IJ SOLN
INTRAMUSCULAR | Status: DC | PRN
  Administered 2018-01-27: 4 mg via INTRAVENOUS

## 2018-01-27 MED ORDER — BUPIVACAINE-EPINEPHRINE (PF) 0.5% -1:200000 IJ SOLN
INTRAMUSCULAR | Status: DC | PRN
  Administered 2018-01-27: 10 mL via PERINEURAL
  Administered 2018-01-27: 20 mL via PERINEURAL

## 2018-01-27 MED ORDER — FAMOTIDINE 20 MG PO TABS
ORAL_TABLET | ORAL | Status: AC
  Filled 2018-01-27: qty 1

## 2018-01-27 MED ORDER — FAMOTIDINE 20 MG PO TABS
20.0000 mg | ORAL_TABLET | Freq: Once | ORAL | Status: AC
  Administered 2018-01-27: 20 mg via ORAL

## 2018-01-27 MED ORDER — DEXAMETHASONE SODIUM PHOSPHATE 10 MG/ML IJ SOLN
INTRAMUSCULAR | Status: AC
  Filled 2018-01-27: qty 1

## 2018-01-27 MED ORDER — BUPIVACAINE-EPINEPHRINE (PF) 0.5% -1:200000 IJ SOLN
INTRAMUSCULAR | Status: AC
  Filled 2018-01-27: qty 30

## 2018-01-27 MED ORDER — ONDANSETRON HCL 4 MG/2ML IJ SOLN
INTRAMUSCULAR | Status: DC | PRN
  Administered 2018-01-27: 4 mg via INTRAVENOUS

## 2018-01-27 SURGICAL SUPPLY — 33 items
BLADE SURG 15 STRL SS SAFETY (BLADE) ×3 IMPLANT
CANISTER SUCT 1200ML W/VALVE (MISCELLANEOUS) ×3 IMPLANT
CHLORAPREP W/TINT 26ML (MISCELLANEOUS) ×3 IMPLANT
CLOSURE WOUND 1/2 X4 (GAUZE/BANDAGES/DRESSINGS) ×1
DECANTER SPIKE VIAL GLASS SM (MISCELLANEOUS) ×3 IMPLANT
DRAIN PENROSE 1/4X12 LTX (DRAIN) ×3 IMPLANT
DRAPE LAPAROTOMY 100X77 ABD (DRAPES) ×3 IMPLANT
DRSG TEGADERM 4X4.75 (GAUZE/BANDAGES/DRESSINGS) ×3 IMPLANT
DRSG TELFA 4X3 1S NADH ST (GAUZE/BANDAGES/DRESSINGS) ×3 IMPLANT
ELECT REM PT RETURN 9FT ADLT (ELECTROSURGICAL) ×3
ELECTRODE REM PT RTRN 9FT ADLT (ELECTROSURGICAL) ×1 IMPLANT
GLOVE BIO SURGEON STRL SZ7.5 (GLOVE) ×6 IMPLANT
GLOVE INDICATOR 8.0 STRL GRN (GLOVE) ×6 IMPLANT
GOWN STRL REUS W/ TWL LRG LVL3 (GOWN DISPOSABLE) ×2 IMPLANT
GOWN STRL REUS W/TWL LRG LVL3 (GOWN DISPOSABLE) ×4
KIT TURNOVER KIT A (KITS) ×3 IMPLANT
LABEL OR SOLS (LABEL) ×3 IMPLANT
MESH PLUG HERNIA INGUINAL MD (Mesh General) ×3 IMPLANT
NEEDLE HYPO 22GX1.5 SAFETY (NEEDLE) ×3 IMPLANT
PACK BASIN MINOR ARMC (MISCELLANEOUS) ×3 IMPLANT
STRIP CLOSURE SKIN 1/2X4 (GAUZE/BANDAGES/DRESSINGS) ×2 IMPLANT
SUT PDS AB 0 CT1 27 (SUTURE) IMPLANT
SUT SURGILON 0 BLK (SUTURE) ×3 IMPLANT
SUT VIC AB 2-0 SH 27 (SUTURE) ×2
SUT VIC AB 2-0 SH 27XBRD (SUTURE) ×1 IMPLANT
SUT VIC AB 3-0 54X BRD REEL (SUTURE) ×1 IMPLANT
SUT VIC AB 3-0 BRD 54 (SUTURE) ×2
SUT VIC AB 3-0 SH 27 (SUTURE) ×2
SUT VIC AB 3-0 SH 27X BRD (SUTURE) ×1 IMPLANT
SUT VIC AB 4-0 FS2 27 (SUTURE) ×3 IMPLANT
SWABSTK COMLB BENZOIN TINCTURE (MISCELLANEOUS) ×3 IMPLANT
SYR 10ML LL (SYRINGE) ×3 IMPLANT
SYR 3ML LL SCALE MARK (SYRINGE) ×3 IMPLANT

## 2018-01-27 NOTE — Transfer of Care (Signed)
Immediate Anesthesia Transfer of Care Note  Patient: Roy Hernandez  Procedure(s) Performed: HERNIA REPAIR INGUINAL ADULT (Right )  Patient Location: PACU  Anesthesia Type:General  Level of Consciousness: drowsy  Airway & Oxygen Therapy: Patient Spontanous Breathing  Post-op Assessment: Report given to RN  Post vital signs: stable  Last Vitals:  Vitals Value Taken Time  BP 130/69 01/27/2018  8:13 AM  Temp 36.5 C 01/27/2018  8:13 AM  Pulse 67 01/27/2018  8:14 AM  Resp 12 01/27/2018  8:14 AM  SpO2 100 % 01/27/2018  8:14 AM  Vitals shown include unvalidated device data.  Last Pain:  Vitals:   01/27/18 0612  TempSrc: Tympanic  PainSc: 0-No pain         Complications: No apparent anesthesia complications

## 2018-01-27 NOTE — OR Nursing (Signed)
Dr. Bary Castilla in to see pt 1004 am - advises ok to discharge pt to home.

## 2018-01-27 NOTE — Op Note (Signed)
Preoperative diagnosis: Symptomatic right inguinal hernia.  Postoperative diagnosis: Same, indirect.  Operative procedure: Right inguinal hernia repair with medium Ultra Pro mesh.  Operating Surgeon: Hervey Ard, MD.  Anesthesia: General by LMA, Marcaine 0.5% with 1 to 200,000 units of epinephrine, 30 cc.  Toradol: 30 mg.  Estimated blood loss: Less than 5 cc.  Clinical note: This 84 year old male has recently developed a symptomatic right inguinal hernia.  He was admitted for elective repair.  He received Kefzol prior to the procedure.  Hair was removed from the surgical site prior to presentation of the operating theater.  Operative note: With the patient under adequate general anesthesia the area was cleansed with ChloraPrep and draped.  Field block anesthesia was placed for postoperative analgesia.  A 5 cm skin line incision along the anticipated course the inguinal canal was carried down through skin subtendinous tissue with hemostasis achieved by electrocautery and 3-0 Vicryl ties.  The external Bleich was opened in the direction of its fibers.  The ilioinguinal and iliohypogastric nerves were identified and protected.  Moderately sized indirect sac with scarring was noted as well as a lipoma the cord.  These were both dissected free of the cord structures and return to the preperitoneal space.  A medium PerFix plug was placed and anchored to the iliotibial tract with interrupted 0 Surgilon sutures.  An onlay mesh was then placed along the floor the inguinal canal.  This was anchored to the pubic tubercle with 0 Surgilon and then interrupted 0 Surgilon sutures along the inguinal ligament.  Care was taken to avoid the genitofemoral nerve.  The medial and superior aspects were anchored to the transverse abdominis aponeurosis.  The lateral slit was closed around the cord.  Toradol was placed into the wound.  The external Bleich was closed with a running 2-0 Vicryl suture.  Scarpa's fascia was  closed with a running 3-0 Vicryl suture.  The skin was closed with a running 4-0 Vicryl subcuticular suture.  Benzoin, Steri-Strips, Telfa and Tegaderm dressings were applied.  The patient tolerated the procedure well was taken recovery in stable condition.

## 2018-01-27 NOTE — H&P (Signed)
Roy Hernandez [id number] [birth year]     HPI:  84 y/o male with symptomatic right inguinal hernia. For repair.   Medications Prior to Admission  Medication Sig Dispense Refill Last Dose  . alendronate (FOSAMAX) 70 MG tablet Take 1 tablet (70 mg total) by mouth once a week. Take with a full glass of water on an empty stomach. (Patient taking differently: Take 70 mg by mouth every Monday. Take with a full glass of water on an empty stomach.) 12 tablet 3 01/23/2018  . aspirin EC 81 MG tablet Take 81 mg by mouth at bedtime.   01/27/2018 at 0500  . Carboxymethylcellulose Sodium (LUBRICANT EYE DROPS PF OP) Place 1 drop into both eyes 3 (three) times daily as needed (for dry eyes.).    Taking  . cholecalciferol (VITAMIN D) 1000 UNITS tablet Take 1,000 Units by mouth daily.    01/26/2018 at Unknown time  . DM-Phenylephrine-Acetaminophen 10-5-325 MG CAPS Take 2 tablets by mouth daily as needed (for allergies.).    Taking  . donepezil (ARICEPT) 10 MG tablet Take 1 tablet (10 mg total) by mouth at bedtime. 90 tablet 1 01/26/2018 at Unknown time  . fluticasone (FLONASE) 50 MCG/ACT nasal spray Place 2 sprays into both nostrils daily. (Patient taking differently: Place 2 sprays into both nostrils daily as needed (for allergies.). ) 48 g 1 Taking  . latanoprost (XALATAN) 0.005 % ophthalmic solution Place 1 drop into both eyes at bedtime.    01/26/2018 at Unknown time  . Menthol-Methyl Salicylate (MUSCLE RUB EX) Apply 1 application topically 4 (four) times daily as needed (for pain.).    Taking  . Naproxen Sodium 220 MG CAPS Take 220 mg by mouth daily as needed (for pain.).    Taking  . simvastatin (ZOCOR) 5 MG tablet Take 1 tablet (5 mg total) by mouth at bedtime. 90 tablet 1 01/26/2018 at Unknown time  . timolol (TIMOPTIC) 0.5 % ophthalmic solution Place 1 drop into both eyes daily.    01/27/2018 at 0500  . vitamin B-12 (CYANOCOBALAMIN) 500 MCG tablet Take 500 mcg by mouth daily.   01/26/2018 at Unknown time  . ONE  TOUCH ULTRA TEST test strip TEST BLOOD GLUCOSE LEVEL ONCE A DAY. 90 each 1 Taking   No Known Allergies Past Medical History:  Diagnosis Date  . AAA (abdominal aortic aneurysm) ([place])   . Dementia   . Diabetes mellitus without complication (HCC)    diet controlled  . Glaucoma   . Hyperkalemia   . Hyperlipidemia   . Hypertension   . Inguinal hernia of right side without obstruction or gangrene   . Osteoporosis   . Peripheral vascular disease ([place])   . Vitamin D deficiency    Past Surgical History:  Procedure Laterality Date  . CATARACT EXTRACTION Bilateral   . TOOTH EXTRACTION     Social History   Socioeconomic History  . Marital status: Widowed    Spouse name: [name]  . Number of children: 3  . Years of education: Not on file  . Highest education level: 9th grade  Occupational History    Employer: [employer]  Social Needs  . Financial resource strain: Not hard at all  . Food insecurity:    Worry: Never true    Inability: Never true  . Transportation needs:    Medical: No    Non-medical: No  Tobacco Use  . Smoking status: Former Smoker    Packs/day: 1.00    Years: 34.00  Pack years: 34.00    Types: Cigarettes    Start date: 08/02/1945    Last attempt to quit: 05/26/1980    Years since quitting: 37.6  . Smokeless tobacco: Never Used  . Tobacco comment: smoking cessation materials not required  Substance and Sexual Activity  . Alcohol use: No    Alcohol/week: 0.0 oz  . Drug use: No  . Sexual activity: Not Currently  Lifestyle  . Physical activity:    Days per week: 3 days    Minutes per session: 20 min  . Stress: Not at all  Relationships  . Social connections:    Talks on phone: Patient refused    Gets together: Patient refused    Attends religious service: Patient refused    Active member of club or organization: Patient refused    Attends meetings of clubs or organizations: Patient refused    Relationship status: Widowed  . Intimate partner violence:     Fear of current or ex partner: No    Emotionally abused: No    Physically abused: No    Forced sexual activity: No  Other Topics Concern  . Not on file  Social History Narrative  . Not on file   Social History   Social History Narrative  . Not on file     ROS: Negative.     PE: HEENT: Negative. Lungs: Clear. Cardio: RR.  Assessment/Plan:  Proceed with planned right inguinal hernia repair.   Forest Gleason [facility] 01/27/2018

## 2018-01-27 NOTE — Anesthesia Postprocedure Evaluation (Signed)
Anesthesia Post Note  Patient: Roy Hernandez  Procedure(s) Performed: HERNIA REPAIR INGUINAL ADULT (Right )  Patient location during evaluation: PACU Anesthesia Type: General Level of consciousness: awake and alert Pain management: pain level controlled Vital Signs Assessment: post-procedure vital signs reviewed and stable Respiratory status: spontaneous breathing and respiratory function stable Cardiovascular status: stable Anesthetic complications: no     Last Vitals:  Vitals:   01/27/18 0813 01/27/18 0822  BP: 130/69   Pulse: 66 72  Resp: 15 16  Temp: 36.5 C   SpO2: 100% 100%    Last Pain:  Vitals:   01/27/18 0822  TempSrc:   PainSc: 0-No pain                 KEPHART,WILLIAM K

## 2018-01-27 NOTE — Anesthesia Post-op Follow-up Note (Signed)
Anesthesia QCDR form completed.        

## 2018-01-27 NOTE — Discharge Instructions (Signed)

## 2018-01-27 NOTE — Anesthesia Preprocedure Evaluation (Signed)
Anesthesia Evaluation  Patient identified by MRN, date of birth, ID band Patient awake    Reviewed: Allergy & Precautions, NPO status , Patient's Chart, lab work & pertinent test results  History of Anesthesia Complications Negative for: history of anesthetic complications  Airway Mallampati: II       Dental  (+) Upper Dentures, Lower Dentures   Pulmonary neg sleep apnea, neg COPD, former smoker,           Cardiovascular hypertension (pt off meds since january), (-) Past MI and (-) CHF (-) dysrhythmias (-) Valvular Problems/Murmurs     Neuro/Psych neg Seizures Dementia    GI/Hepatic Neg liver ROS, neg GERD  ,  Endo/Other  diabetes (borderline)  Renal/GU negative Renal ROS     Musculoskeletal   Abdominal   Peds  Hematology   Anesthesia Other Findings   Reproductive/Obstetrics                             Anesthesia Physical Anesthesia Plan  ASA: II  Anesthesia Plan: General   Post-op Pain Management:    Induction: Intravenous  PONV Risk Score and Plan: 2 and Dexamethasone and Ondansetron  Airway Management Planned: LMA  Additional Equipment:   Intra-op Plan:   Post-operative Plan:   Informed Consent: I have reviewed the patients History and Physical, chart, labs and discussed the procedure including the risks, benefits and alternatives for the proposed anesthesia with the patient or authorized representative who has indicated his/her understanding and acceptance.     Plan Discussed with:   Anesthesia Plan Comments:         Anesthesia Quick Evaluation

## 2018-02-07 ENCOUNTER — Encounter: Payer: Self-pay | Admitting: Family Medicine

## 2018-02-08 ENCOUNTER — Ambulatory Visit: Payer: Medicare HMO | Admitting: Family Medicine

## 2018-02-09 ENCOUNTER — Ambulatory Visit (INDEPENDENT_AMBULATORY_CARE_PROVIDER_SITE_OTHER): Payer: Medicare HMO | Admitting: General Surgery

## 2018-02-09 ENCOUNTER — Encounter: Payer: Self-pay | Admitting: General Surgery

## 2018-02-09 VITALS — BP 116/70 | HR 72 | Resp 15 | Ht 71.0 in | Wt 162.0 lb

## 2018-02-09 DIAGNOSIS — K4090 Unilateral inguinal hernia, without obstruction or gangrene, not specified as recurrent: Secondary | ICD-10-CM

## 2018-02-09 LAB — BASIC METABOLIC PANEL
BUN: 19 (ref 4–21)
CREATININE: 0.9 (ref 0.6–1.3)
Glucose: 88
POTASSIUM: 4.2 (ref 3.4–5.3)
SODIUM: 141 (ref 137–147)

## 2018-02-09 NOTE — Patient Instructions (Signed)
Patient to return as needed. The patient is aware to call back for any questions or concerns. 

## 2018-02-09 NOTE — Progress Notes (Signed)
Patient ID: Roy Hernandez, male   DOB: [birth year], 84 y.o.   MRN: [id number]  Chief Complaint  Patient presents with  . Routine Post Op    HPI Roy Hernandez is a 84 y.o. male here today for his post op right inguinal hernia repair done on 01/27/2018. Patient states he is doing well.  HPI  Past Medical History:  Diagnosis Date  . AAA (abdominal aortic aneurysm) ([place])   . Dementia   . Diabetes mellitus without complication (HCC)    diet controlled  . Glaucoma   . Hyperkalemia   . Hyperlipidemia   . Hypertension   . Inguinal hernia of right side without obstruction or gangrene   . Osteoporosis   . Peripheral vascular disease ([place])   . Vitamin D deficiency     Past Surgical History:  Procedure Laterality Date  . CATARACT EXTRACTION Bilateral   . INGUINAL HERNIA REPAIR Right 01/27/2018   Medium Ultra Pro mesh.  Surgeon: Robert Bellow, MD;  Location: [facility] ORS;  Service: General;  Laterality: Right;  with mesh  . TOOTH EXTRACTION      Family History  Problem Relation Age of Onset  . Cancer Mother   . Hypertension Mother   . Diabetes Father   . Hyperlipidemia Father   . Diabetes Maternal Grandmother   . Diabetes Brother   . Kidney disease Brother        on dialysis  . Diabetes Sister   . Kidney disease Sister        dialysis  . Cancer Brother        esophageal  . Leukemia Sister     Social History Social History   Tobacco Use  . Smoking status: Former Smoker    Packs/day: 1.00    Years: 34.00    Pack years: 34.00    Types: Cigarettes    Start date: 08/02/1945    Last attempt to quit: 05/26/1980    Years since quitting: 37.7  . Smokeless tobacco: Never Used  . Tobacco comment: smoking cessation materials not required  Substance Use Topics  . Alcohol use: No    Alcohol/week: 0.0 oz  . Drug use: No    No Known Allergies  Current Outpatient Medications  Medication Sig Dispense Refill  . alendronate (FOSAMAX) 70 MG tablet Take 1 tablet (70 mg total) by  mouth once a week. Take with a full glass of water on an empty stomach. (Patient taking differently: Take 70 mg by mouth every Monday. Take with a full glass of water on an empty stomach.) 12 tablet 3  . aspirin EC 81 MG tablet Take 81 mg by mouth at bedtime.    . Carboxymethylcellulose Sodium (LUBRICANT EYE DROPS PF OP) Place 1 drop into both eyes 3 (three) times daily as needed (for dry eyes.).     [name] cholecalciferol (VITAMIN D) 1000 UNITS tablet Take 1,000 Units by mouth daily.     [name] DM-Phenylephrine-Acetaminophen 10-5-325 MG CAPS Take 2 tablets by mouth daily as needed (for allergies.).     [name] donepezil (ARICEPT) 10 MG tablet Take 1 tablet (10 mg total) by mouth at bedtime. 90 tablet 1  . fluticasone (FLONASE) 50 MCG/ACT nasal spray Place 2 sprays into both nostrils daily. (Patient taking differently: Place 2 sprays into both nostrils daily as needed (for allergies.). ) 48 g 1  . latanoprost (XALATAN) 0.005 % ophthalmic solution Place 1 drop into both eyes at bedtime.     . Menthol-Methyl Salicylate (  MUSCLE RUB EX) Apply 1 application topically 4 (four) times daily as needed (for pain.).     . Naproxen Sodium 220 MG CAPS Take 220 mg by mouth daily as needed (for pain.).     [name] ONE TOUCH ULTRA TEST test strip TEST BLOOD GLUCOSE LEVEL ONCE A DAY. 90 each 1  . simvastatin (ZOCOR) 5 MG tablet Take 1 tablet (5 mg total) by mouth at bedtime. 90 tablet 1  . timolol (TIMOPTIC) 0.5 % ophthalmic solution Place 1 drop into both eyes daily.     . vitamin B-12 (CYANOCOBALAMIN) 500 MCG tablet Take 500 mcg by mouth daily.     No current facility-administered medications for this visit.     Review of Systems Review of Systems  Constitutional: Negative.   Respiratory: Negative.   Cardiovascular: Negative.     Blood pressure 116/70, pulse 72, resp. rate 15, height 5\' 11"  (1.803 m), weight 162 lb (73.5 kg).  Physical Exam Physical Exam  Constitutional: He is oriented to person, place, and time. He appears  well-developed and well-nourished.  Abdominal:  Right inguina hernia repair intact and healing well.   Genitourinary:     Neurological: He is alert and oriented to person, place, and time.  Skin: Skin is warm and dry.       Assessment    Doing well post inguinal hernia repair.    Plan  Patient to return as needed. The patient is aware to call back for any questions or concerns.   HPI, Physical Exam, Assessment and Plan have been scribed under the direction and in the presence of Hervey Ard, MD.  Gaspar Cola, CMA  I have completed the exam and reviewed the above documentation for accuracy and completeness.  I agree with the above.  Haematologist has been used and any errors in dictation or transcription are unintentional.  Hervey Ard, M.D., F.A.C.S.  Forest Gleason Byrnett 02/09/2018, 8:45 PM

## 2018-02-13 ENCOUNTER — Encounter: Payer: Self-pay | Admitting: Family Medicine

## 2018-02-13 ENCOUNTER — Ambulatory Visit (INDEPENDENT_AMBULATORY_CARE_PROVIDER_SITE_OTHER): Payer: Medicare HMO | Admitting: Family Medicine

## 2018-02-13 VITALS — BP 110/60 | HR 77 | Temp 97.7°F | Resp 16 | Ht 67.0 in | Wt 163.1 lb

## 2018-02-13 DIAGNOSIS — I714 Abdominal aortic aneurysm, without rupture, unspecified: Secondary | ICD-10-CM

## 2018-02-13 DIAGNOSIS — R803 Bence Jones proteinuria: Secondary | ICD-10-CM

## 2018-02-13 DIAGNOSIS — R4189 Other symptoms and signs involving cognitive functions and awareness: Secondary | ICD-10-CM

## 2018-02-13 DIAGNOSIS — I739 Peripheral vascular disease, unspecified: Secondary | ICD-10-CM | POA: Diagnosis not present

## 2018-02-13 DIAGNOSIS — I10 Essential (primary) hypertension: Secondary | ICD-10-CM

## 2018-02-13 DIAGNOSIS — E7800 Pure hypercholesterolemia, unspecified: Secondary | ICD-10-CM | POA: Diagnosis not present

## 2018-02-13 DIAGNOSIS — E538 Deficiency of other specified B group vitamins: Secondary | ICD-10-CM

## 2018-02-13 DIAGNOSIS — I447 Left bundle-branch block, unspecified: Secondary | ICD-10-CM | POA: Diagnosis not present

## 2018-02-13 DIAGNOSIS — D692 Other nonthrombocytopenic purpura: Secondary | ICD-10-CM | POA: Diagnosis not present

## 2018-02-13 MED ORDER — SIMVASTATIN 5 MG PO TABS: 5.0000 mg | ORAL_TABLET | Freq: Every day | ORAL | 1 refills | Status: DC

## 2018-02-13 NOTE — Addendum Note (Signed)
Addended by: Inda Coke on: 02/13/2018 01:39 PM   Modules accepted: Orders

## 2018-02-13 NOTE — Progress Notes (Signed)
Name: Roy Hernandez   MRN: [id number]    DOB: [birth year]   Date:02/13/2018       Progress Note  Subjective  Chief Complaint  Chief Complaint  Patient presents with  . Follow-up    patient is here for his 3 month f/u  . Hypertension    patient takes meds as directed. no neg sx  . Hyperlipidemia    no neg sx    HPI   Daugther : Roy Hernandez is here with him today   HTN: off bp medication for over 5 months, bp is still towards low end of normal,he denies any dizziness, he is not taking benadryl now. Using flonase prn   Paresthesia: he has noticed numbness on both feet a few years ago, but getting worse. He was diagnosed with B12 deficiency and is taking B12 1000 mcg daily   Severe decrease in cognitive function: still lives alone, came in today with his daughter - Roy Hernandez - his CIT 6 has increased from 12 to 21 over the past year, MMS today was low at 50 . He still drives to grocery stores, and girlfriend's house and church. Not getting lost. His daughter is paying his bills ( since his wife died in 09/11/10) He has episodes that he repeats himself. Daughter is taking care of his finances now.   Osteoporosis with history of compression fractures spine: taking fosamax,, seen by Dr. Honor Junes and had positive of Bence Jones and was referral to hematologist/oncologist. He will start Prolia now.   Hyperlipidemia: taking simvastatin daily and denies side effects, no chest pain or palpitation  Left BBB: found by Dr. Clayborn Bigness during pre-op clearance, and is going back for stress test and echo this week. No chest pain, decrease in exercise tolerance or palpitation  Aneurysm Aorta and also PAD: sees Dr. Payton Mccallum and currently no claudication.    Patient Active Problem List   Diagnosis Date Noted  . 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 Left bundle-branch block 02/13/2018  . History of right inguinal hernia repair 12/16/2017  . Epiretinal membrane (ERM) of right eye 10/11/2017  .  DJD (degenerative joint disease) 10/04/2017  . Hyperglycemia 10/04/2017  . Advanced stage glaucoma 10/04/2017  . Hypertension, benign 07/06/2017  . PAD (peripheral artery disease) ([place]) 10/19/2016  . Osteoporosis of femur without pathological fracture 11/25/2015  . AAA (abdominal aortic aneurysm) without rupture ([place]) 11/05/2014  . Abnormal ECG 11/05/2014  . At risk for falling 11/05/2014  . DD (diverticular disease) 11/05/2014  . Dyslipidemia 11/05/2014  . Failure of erection 11/05/2014  . Degenerative arthritis of lumbar spine 11/05/2014  . Compressed spine fracture ([place]) 11/05/2014  . Basal cell papilloma 11/05/2014  . Vitamin D deficiency 11/05/2014    Past Surgical History:  Procedure Laterality Date  . CATARACT EXTRACTION Bilateral   . INGUINAL HERNIA REPAIR Right 01/27/2018   Medium Ultra Pro mesh.  Surgeon: Robert Bellow, MD;  Location: [facility] ORS;  Service: General;  Laterality: Right;  with mesh  . TOOTH EXTRACTION      Family History  Problem Relation Age of Onset  . Cancer Mother   . Hypertension Mother   . Diabetes Father   . Hyperlipidemia Father   . Diabetes Maternal Grandmother   . Diabetes Brother   . Kidney disease Brother        on dialysis  . Diabetes Sister   . Kidney disease Sister        dialysis  . Cancer Brother  esophageal  . Leukemia Sister     Social History   Socioeconomic History  . Marital status: Widowed    Spouse name: Roy Hernandez  . Number of children: 3  . Years of education: Not on file  . Highest education level: 9th grade  Occupational History    Employer: [employer]  Social Needs  . Financial resource strain: Not hard at all  . Food insecurity:    Worry: Never true    Inability: Never true  . Transportation needs:    Medical: No    Non-medical: No  Tobacco Use  . Smoking status: Former Smoker    Packs/day: 1.00    Years: 34.00    Pack years: 34.00    Types: Cigarettes    Start date: 08/02/1945    Last attempt to  quit: 05/26/1980    Years since quitting: 37.7  . Smokeless tobacco: Never Used  . Tobacco comment: smoking cessation materials not required  Substance and Sexual Activity  . Alcohol use: No    Alcohol/week: 0.0 oz  . Drug use: No  . Sexual activity: Not Currently  Lifestyle  . Physical activity:    Days per week: 3 days    Minutes per session: 20 min  . Stress: Not at all  Relationships  . Social connections:    Talks on phone: More than three times a week    Gets together: Twice a week    Attends religious service: More than 4 times per year    Active member of club or organization: No    Attends meetings of clubs or organizations: Never    Relationship status: Widowed  . Intimate partner violence:    Fear of current or ex partner: No    Emotionally abused: No    Physically abused: No    Forced sexual activity: No  Other Topics Concern  . Not on file  Social History Narrative  . Not on file     Current Outpatient Medications:  .  aspirin EC 81 MG tablet, Take 81 mg by mouth at bedtime., Disp: , Rfl:  .  Carboxymethylcellulose Sodium (LUBRICANT EYE DROPS PF OP), Place 1 drop into both eyes 3 (three) times daily as needed (for dry eyes.). , Disp: , Rfl:  .  cholecalciferol (VITAMIN D) 1000 UNITS tablet, Take 1,000 Units by mouth daily. , Disp: , Rfl:  .  DM-Phenylephrine-Acetaminophen 10-5-325 MG CAPS, Take 2 tablets by mouth daily as needed (for allergies.). , Disp: , Rfl:  .  donepezil (ARICEPT) 10 MG tablet, Take 1 tablet (10 mg total) by mouth at bedtime., Disp: 90 tablet, Rfl: 1 .  fluticasone (FLONASE) 50 MCG/ACT nasal spray, Place 2 sprays into both nostrils daily. (Patient taking differently: Place 2 sprays into both nostrils daily as needed (for allergies.). ), Disp: 48 g, Rfl: 1 .  latanoprost (XALATAN) 0.005 % ophthalmic solution, Place 1 drop into both eyes at bedtime. , Disp: , Rfl:  .  Menthol-Methyl Salicylate (MUSCLE RUB EX), Apply 1 application topically 4  (four) times daily as needed (for pain.). , Disp: , Rfl:  .  Naproxen Sodium 220 MG CAPS, Take 220 mg by mouth daily as needed (for pain.). , Disp: , Rfl:  .  ONE TOUCH ULTRA TEST test strip, TEST BLOOD GLUCOSE LEVEL ONCE A DAY., Disp: 90 each, Rfl: 1 .  simvastatin (ZOCOR) 5 MG tablet, Take 1 tablet (5 mg total) by mouth at bedtime., Disp: 90 tablet, Rfl: 1 .  timolol (TIMOPTIC) 0.5 % ophthalmic solution, Place 1 drop into both eyes daily. , Disp: , Rfl:  .  vitamin B-12 (CYANOCOBALAMIN) 500 MCG tablet, Take 1,000 mcg by mouth daily. , Disp: , Rfl:  .  alendronate (FOSAMAX) 70 MG tablet, Take 1 tablet (70 mg total) by mouth once a week. Take with a full glass of water on an empty stomach. (Patient taking differently: Take 70 mg by mouth every Monday. Take with a full glass of water on an empty stomach.), Disp: 12 tablet, Rfl: 3  No Known Allergies   ROS  Constitutional: Negative for fever or weight change.  Respiratory: Negative for cough and shortness of breath.   Cardiovascular: Negative for chest pain or palpitations.  Gastrointestinal: Negative for abdominal pain, no bowel changes.  Musculoskeletal: Negative for gait problem or joint swelling.  Skin: Negative for rash. He has senile purpura both arms.  Neurological: Negative for dizziness or headache.  No other specific complaints in a complete review of systems (except as listed in HPI above).  Objective  Vitals:   02/13/18 1258  BP: 110/60  Pulse: 77  Resp: 16  Temp: 97.7 F (36.5 C)  TempSrc: Oral  SpO2: 97%  Weight: 163 lb 1.6 oz (74 kg)  Height: _0  (1.702 m)    Body mass index is 25.55 kg/m.  Physical Exam  Constitutional: Patient appears well-developed and well-nourished. No distress.  HEENT: head atraumatic, normocephalic, pupils equal and reactive to light, ears normal TM, neck supple, throat within normal limits Cardiovascular: Normal rate, regular rhythm and normal heart sounds.  No murmur heard. No BLE  edema. Pulmonary/Chest: Effort normal and breath sounds normal. No respiratory distress. Abdominal: Soft.  There is no tenderness. Psychiatric: Patient has a normal mood and affect. behavior is normal. Judgment and thought content normal.  Recent Results (from the past 2160 hour(s))  Basic metabolic panel     Status: Abnormal   Collection Time: 01/20/18  9:37 AM  Result Value Ref Range   Sodium 138 135 - 145 mmol/L   Potassium 4.0 3.5 - 5.1 mmol/L   Chloride 103 101 - 111 mmol/L   CO2 27 22 - 32 mmol/L   Glucose, Bld 103 (H) 65 - 99 mg/dL   BUN 14 6 - 20 mg/dL   Creatinine, Ser 0.79 0.61 - 1.24 mg/dL   Calcium 8.9 8.9 - 10.3 mg/dL   GFR calc non Af Amer >60 >60 mL/min   GFR calc Af Amer >60 >60 mL/min    Comment: (NOTE) The eGFR has been calculated using the CKD EPI equation. This calculation has not been validated in all clinical situations. eGFR's persistently <60 mL/min signify possible Chronic Kidney Disease.    Anion gap 8 5 - 15    Comment: Performed at [facility], [address] [place], [place] [address]  CBC     Status: Abnormal   Collection Time: 01/20/18  9:37 AM  Result Value Ref Range   WBC 5.6 3.8 - 10.6 K/uL   RBC 4.13 (L) 4.40 - 5.90 MIL/uL   Hemoglobin 13.5 13.0 - 18.0 g/dL   HCT 39.1 (L) 40.0 - 52.0 %   MCV 94.7 80.0 - 100.0 fL   MCH 32.7 26.0 - 34.0 pg   MCHC 34.5 32.0 - 36.0 g/dL   RDW 13.0 11.5 - 14.5 %   Platelets 151 150 - 440 K/uL    Comment: Performed at [facility], [address], [place], [place] [address]  Glucose, capillary     Status: None   Collection Time: 01/27/18  6:11 AM  Result Value Ref Range   Glucose-Capillary 98 70 - 99 mg/dL     PHQ2/9: Depression screen [facility] 2/9 02/13/2018 10/04/2017 10/04/2017 09/08/2017 04/06/2017  Decreased Interest 0 0 0 0 0  Down, Depressed, Hopeless 0 0 0 0 0  PHQ - 2 Score 0 0 0 0 0  Altered sleeping - 0 - - -  Tired, decreased energy - 0 - - -  Change in appetite - 0 - - -  Feeling  bad or failure about yourself  - 0 - - -  Trouble concentrating - 0 - - -  Moving slowly or fidgety/restless - 0 - - -  Suicidal thoughts - 0 - - -  PHQ-9 Score - 0 - - -  Difficult doing work/chores - Not difficult at all - - -    Fall Risk: Fall Risk  02/13/2018 10/04/2017 09/08/2017 07/06/2017 04/06/2017  Falls in the past year? No Yes No No No  Number falls in past yr: - 2 or more - - -  Injury with Fall? - No - - -  Risk Factor Category  - High Fall Risk - - -  Risk for fall due to : - History of fall(s);Impaired balance/gait;Impaired vision - - -  Risk for fall due to: Comment - slow gait, weak at times when walking, drags feet; wears glasses - - -  Follow up - Falls evaluation completed;Education provided;Falls prevention discussed - - -    Last year, he hit the door frame and slid down, but not over the past  Functional Status Survey: Is the patient deaf or have difficulty hearing?: No Does the patient have difficulty seeing, even when wearing glasses/contacts?: No Does the patient have difficulty concentrating, remembering, or making decisions?: No Does the patient have difficulty walking or climbing stairs?: No Does the patient have difficulty dressing or bathing?: No Does the patient have difficulty doing errands alone such as visiting a doctor's office or shopping?: No    Assessment & Plan  1. Essential hypertension  Off medication    2. Bence Jones proteinuria  Found on studies done by Dr. Honor Junes   3. Left bundle-branch block  Keep follow up with Dr. Clayborn Bigness   4. PAD (peripheral artery disease) ([place])  Seen vascular surgeon, non claudication recently   5. Senile purpura (HCC)  stable  6. Pure hypercholesterolemia  - simvastatin (ZOCOR) 5 MG tablet; Take 1 tablet (5 mg total) by mouth at bedtime.  Dispense: 90 tablet; Refill: 1  7. B12 deficiency  Continue supplementation otc   8. Persistent cognitive impairment  On medication   9. Abdominal  aortic aneurysm (AAA) without rupture (HCC)  Stable

## 2018-02-24 ENCOUNTER — Ambulatory Visit
Admission: RE | Admit: 2018-02-24 | Discharge: 2018-02-24 | Disposition: A | Discharge status: Home / Self Care | Payer: Medicare HMO | Source: Ambulatory Visit | Attending: Oncology | Admitting: Oncology

## 2018-02-24 ENCOUNTER — Encounter: Payer: Self-pay | Admitting: Oncology

## 2018-02-24 ENCOUNTER — Inpatient Hospital Stay: Payer: Medicare HMO | Attending: Oncology | Admitting: Oncology

## 2018-02-24 ENCOUNTER — Other Ambulatory Visit: Payer: Self-pay

## 2018-02-24 ENCOUNTER — Inpatient Hospital Stay: Payer: Medicare HMO

## 2018-02-24 VITALS — BP 99/65 | HR 80 | Temp 97.8°F | Resp 18 | Ht 67.0 in | Wt 165.4 lb

## 2018-02-24 DIAGNOSIS — M810 Age-related osteoporosis without current pathological fracture: Secondary | ICD-10-CM | POA: Diagnosis not present

## 2018-02-24 DIAGNOSIS — G8929 Other chronic pain: Secondary | ICD-10-CM | POA: Insufficient documentation

## 2018-02-24 DIAGNOSIS — R803 Bence Jones proteinuria: Secondary | ICD-10-CM

## 2018-02-24 DIAGNOSIS — I10 Essential (primary) hypertension: Secondary | ICD-10-CM | POA: Diagnosis not present

## 2018-02-24 DIAGNOSIS — Z87891 Personal history of nicotine dependence: Secondary | ICD-10-CM | POA: Diagnosis not present

## 2018-02-24 DIAGNOSIS — M545 Low back pain: Secondary | ICD-10-CM | POA: Insufficient documentation

## 2018-02-24 DIAGNOSIS — Z9181 History of falling: Secondary | ICD-10-CM

## 2018-02-24 DIAGNOSIS — I700 Atherosclerosis of aorta: Secondary | ICD-10-CM | POA: Diagnosis not present

## 2018-02-24 DIAGNOSIS — M4854XA Collapsed vertebra, not elsewhere classified, thoracic region, initial encounter for fracture: Secondary | ICD-10-CM | POA: Diagnosis not present

## 2018-02-24 LAB — COMPREHENSIVE METABOLIC PANEL
ALK PHOS: 64 U/L (ref 38–126)
ALT: 10 U/L (ref 0–44)
AST: 18 U/L (ref 15–41)
Albumin: 3.6 g/dL (ref 3.5–5.0)
Anion gap: 9 (ref 5–15)
BILIRUBIN TOTAL: 0.6 mg/dL (ref 0.3–1.2)
BUN: 15 mg/dL (ref 8–23)
CALCIUM: 9.3 mg/dL (ref 8.9–10.3)
CO2: 26 mmol/L (ref 22–32)
Chloride: 103 mmol/L (ref 98–111)
Creatinine, Ser: 0.85 mg/dL (ref 0.61–1.24)
GFR calc Af Amer: >60 mL/min (ref >60)
GFR calc non Af Amer: >60 mL/min (ref >60)
Glucose, Bld: 112 mg/dL — ABNORMAL HIGH (ref 70–99)
Potassium: 4.1 mmol/L (ref 3.5–5.1)
SODIUM: 138 mmol/L (ref 135–145)
TOTAL PROTEIN: 6.6 g/dL (ref 6.5–8.1)

## 2018-02-24 LAB — CBC WITH DIFFERENTIAL/PLATELET
BASOS ABS: 0 10*3/uL (ref 0–0.1)
BASOS PCT: 1 %
EOS PCT: 6 %
Eosinophils Absolute: 0.3 10*3/uL (ref 0–0.7)
HCT: 38.2 % — ABNORMAL LOW (ref 40.0–52.0)
Hemoglobin: 13.1 g/dL (ref 13.0–18.0)
LYMPHS PCT: 23 %
Lymphs Abs: 1.2 10*3/uL (ref 1.0–3.6)
MCH: 32.2 pg (ref 26.0–34.0)
MCHC: 34.2 g/dL (ref 32.0–36.0)
MCV: 94.1 fL (ref 80.0–100.0)
MONO ABS: 0.5 10*3/uL (ref 0.2–1.0)
MONOS PCT: 9 %
Neutro Abs: 3.2 10*3/uL (ref 1.4–6.5)
Neutrophils Relative %: 61 %
PLATELETS: 166 10*3/uL (ref 150–440)
RBC: 4.05 MIL/uL — ABNORMAL LOW (ref 4.40–5.90)
RDW: 13.1 % (ref 11.5–14.5)
WBC: 5.2 10*3/uL (ref 3.8–10.6)

## 2018-02-24 NOTE — Progress Notes (Signed)
Hematology/Oncology Consult note [facility] Telephone:([phone] [phone]  Patient Care Team: [name], MD as PCP - General (Family Medicine) Brendolyn Patty, MD as Consulting Physician (Dermatology) Delana Meyer, Dolores Lory, MD as Consulting Physician (Vascular Surgery)   Name of the patient: Roy Hernandez  [id number]  07/12/1928    Reason for referral- bence jones protein in urine   Referring physician- Dr. Honor Junes  Date of visit: 02/24/18   History of presenting illness-patient is a 84 year old male with a past medical history significant for hypertension, hyperlipidemia, osteoporosis, cognitive dysfunction, left bundle branch block and aneurysm of his aorta.  He also recently underwent inguinal hernia surgery.  He has been referred to [place] for Bence-Jones proteins detected in the urine.  BMP from 02/08/2018 was within normal limits.  Urine immunofixation showed lambda type Bence-Jones proteins.CBC from 01/20/2018 showed white count of 5.6, H&H of 13.5/39.1 and a platelet count of 151.   Patient is here with his daughter today. He reports doing well overall he does have chronic back pain.  Following a fall a few years ago.  He still lives alone and is independent of his ADLs and IADLs.  ECOG PS- 1  Pain scale- 0   Review of systems- Review of Systems  Constitutional: Positive for malaise/fatigue. Negative for chills, fever and weight loss.  HENT: Negative for congestion, ear discharge and nosebleeds.   Eyes: Negative for blurred vision.  Respiratory: Negative for cough, hemoptysis, sputum production, shortness of breath and wheezing.   Cardiovascular: Negative for chest pain, palpitations, orthopnea and claudication.  Gastrointestinal: Negative for abdominal pain, blood in stool, constipation, diarrhea, heartburn, melena, nausea and vomiting.  Genitourinary: Negative for dysuria, flank pain, frequency, hematuria and urgency.  Musculoskeletal:  Positive for back pain. Negative for joint pain and myalgias.  Skin: Negative for rash.  Neurological: Negative for dizziness, tingling, focal weakness, seizures, weakness and headaches.  Endo/Heme/Allergies: Does not bruise/bleed easily.  Psychiatric/Behavioral: Negative for depression and suicidal ideas. The patient does not have insomnia.     No Known Allergies  Patient Active Problem List   Diagnosis Date Noted  . 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 Left bundle-branch block 02/13/2018  . History of right inguinal hernia repair 12/16/2017  . Epiretinal membrane (ERM) of right eye 10/11/2017  . DJD (degenerative joint disease) 10/04/2017  . Hyperglycemia 10/04/2017  . Advanced stage glaucoma 10/04/2017  . Hypertension, benign 07/06/2017  . PAD (peripheral artery disease) ([place]) 10/19/2016  . Osteoporosis of femur without pathological fracture 11/25/2015  . AAA (abdominal aortic aneurysm) without rupture ([place]) 11/05/2014  . Abnormal ECG 11/05/2014  . At risk for falling 11/05/2014  . DD (diverticular disease) 11/05/2014  . Dyslipidemia 11/05/2014  . Failure of erection 11/05/2014  . Degenerative arthritis of lumbar spine 11/05/2014  . Compressed spine fracture ([place]) 11/05/2014  . Basal cell papilloma 11/05/2014  . Vitamin D deficiency 11/05/2014     Past Medical History:  Diagnosis Date  . AAA (abdominal aortic aneurysm) ([place])   . Dementia   . Diabetes mellitus without complication (HCC)    diet controlled  . Glaucoma   . Hyperkalemia   . Hyperlipidemia   . Hypertension   . Inguinal hernia of right side without obstruction or gangrene   . Osteoporosis   . Peripheral vascular disease ([place])   . Vitamin D deficiency      Past Surgical History:  Procedure Laterality Date  . CATARACT EXTRACTION Bilateral   . INGUINAL HERNIA REPAIR Right 01/27/2018  Medium Ultra Pro mesh.  Surgeon: Robert Bellow, MD;  Location: [facility] ORS;  Service: General;  Laterality: Right;   with mesh  . TOOTH EXTRACTION      Social History   Socioeconomic History  . Marital status: Widowed    Spouse name: [name]  . Number of children: 3  . Years of education: Not on file  . Highest education level: 9th grade  Occupational History    Employer: [employer]  Social Needs  . Financial resource strain: Not hard at all  . Food insecurity:    Worry: Never true    Inability: Never true  . Transportation needs:    Medical: No    Non-medical: No  Tobacco Use  . Smoking status: Former Smoker    Packs/day: 1.00    Years: 34.00    Pack years: 34.00    Types: Cigarettes    Start date: 08/02/1945    Last attempt to quit: 05/26/1980    Years since quitting: 37.7  . Smokeless tobacco: Never Used  . Tobacco comment: smoking cessation materials not required  Substance and Sexual Activity  . Alcohol use: No    Alcohol/week: 0.0 oz  . Drug use: No  . Sexual activity: Not Currently  Lifestyle  . Physical activity:    Days per week: 3 days    Minutes per session: 20 min  . Stress: Not at all  Relationships  . Social connections:    Talks on phone: More than three times a week    Gets together: Twice a week    Attends religious service: More than 4 times per year    Active member of club or organization: No    Attends meetings of clubs or organizations: Never    Relationship status: Widowed  . Intimate partner violence:    Fear of current or ex partner: No    Emotionally abused: No    Physically abused: No    Forced sexual activity: No  Other Topics Concern  . Not on file  Social History Narrative  . Not on file     Family History  Problem Relation Age of Onset  . Cancer Mother   . Hypertension Mother   . Diabetes Father   . Hyperlipidemia Father   . Diabetes Maternal Grandmother   . Diabetes Brother   . Kidney disease Brother        on dialysis  . Diabetes Sister   . Kidney disease Sister        dialysis  . Cancer Brother        esophageal  . Leukemia  Sister      Current Outpatient Medications:  .  alendronate (FOSAMAX) 70 MG tablet, Take 1 tablet (70 mg total) by mouth once a week. Take with a full glass of water on an empty stomach. (Patient taking differently: Take 70 mg by mouth every Monday. Take with a full glass of water on an empty stomach.), Disp: 12 tablet, Rfl: 3 .  aspirin EC 81 MG tablet, Take 81 mg by mouth at bedtime., Disp: , Rfl:  .  cholecalciferol (VITAMIN D) 1000 UNITS tablet, Take 1,000 Units by mouth daily. , Disp: , Rfl:  .  donepezil (ARICEPT) 10 MG tablet, Take 1 tablet (10 mg total) by mouth at bedtime., Disp: 90 tablet, Rfl: 1 .  latanoprost (XALATAN) 0.005 % ophthalmic solution, Place 1 drop into both eyes at bedtime. , Disp: , Rfl:  .  simvastatin (ZOCOR) 5  MG tablet, Take 1 tablet (5 mg total) by mouth at bedtime., Disp: 90 tablet, Rfl: 1 .  timolol (TIMOPTIC) 0.5 % ophthalmic solution, Place 1 drop into both eyes daily. , Disp: , Rfl:  .  vitamin B-12 (CYANOCOBALAMIN) 500 MCG tablet, Take 1,000 mcg by mouth daily. , Disp: , Rfl:  .  Carboxymethylcellulose Sodium (LUBRICANT EYE DROPS PF OP), Place 1 drop into both eyes 3 (three) times daily as needed (for dry eyes.). , Disp: , Rfl:  .  DM-Phenylephrine-Acetaminophen 10-5-325 MG CAPS, Take 2 tablets by mouth daily as needed (for allergies.). , Disp: , Rfl:  .  fluticasone (FLONASE) 50 MCG/ACT nasal spray, Place 2 sprays into both nostrils daily. (Patient not taking: Reported on 02/24/2018), Disp: 48 g, Rfl: 1 .  Menthol-Methyl Salicylate (MUSCLE RUB EX), Apply 1 application topically 4 (four) times daily as needed (for pain.). , Disp: , Rfl:  .  Naproxen Sodium 220 MG CAPS, Take 220 mg by mouth daily as needed (for pain.). , Disp: , Rfl:  .  ONE TOUCH ULTRA TEST test strip, TEST BLOOD GLUCOSE LEVEL ONCE A DAY. (Patient not taking: Reported on 02/24/2018), Disp: 90 each, Rfl: 1   Physical exam:  Vitals:   02/24/18 1339  BP: 99/65  Pulse: 80  Resp: 18  Temp:  97.8 F (36.6 C)  TempSrc: Tympanic  SpO2: 96%  Weight: 165 lb 6.4 oz (75 kg)  Height: '5\' 7"'  (1.702 m)   Physical Exam  Constitutional: He is oriented to person, place, and time.  Thin elderly gentleman in no acute distress  HENT:  Head: Normocephalic and atraumatic.  Eyes: Pupils are equal, round, and reactive to light. EOM are normal.  Neck: Normal range of motion.  Cardiovascular: Normal rate, regular rhythm and normal heart sounds.  Pulmonary/Chest: Effort normal and breath sounds normal.  Abdominal: Soft. Bowel sounds are normal.  Musculoskeletal: He exhibits no edema.  Lymphadenopathy:  No palpable cervical, supraclavicular, axillary or inguinal adenopathy   Neurological: He is alert and oriented to person, place, and time.  Skin: Skin is warm and dry.       CMP Latest Ref Rng & Units 02/09/2018  Glucose 65 - 99 mg/dL -  BUN 4 - 21 19  Creatinine 0.6 - 1.3 0.9  Sodium 137 - 147 141  Potassium 3.4 - 5.3 4.2  Chloride 101 - 111 mmol/L -  CO2 22 - 32 mmol/L -  Calcium 8.9 - 10.3 mg/dL -  Total Protein 6.1 - 8.1 g/dL -  Total Bilirubin 0.2 - 1.2 mg/dL -  Alkaline Phos 39 - 117 IU/L -  AST 10 - 35 U/L -  ALT 9 - 46 U/L -   CBC Latest Ref Rng & Units 01/20/2018  WBC 3.8 - 10.6 K/uL 5.6  Hemoglobin 13.0 - 18.0 g/dL 13.5  Hematocrit 40.0 - 52.0 % 39.1(L)  Platelets 150 - 440 K/uL 151     Assessment and plan- Patient is a 84 y.o. male referred for Bence-Jones proteinuria lambda type  On reviewing his recent CBC and CMP there was no evidence of anemia, AKI or hypercalcemia.  He was found to have Bence-Jones proteinuria and I will evaluate him further for multiple myeloma.  I will repeat a CBC with differential, CMP, myeloma panel, serum free light chains as well as 24-hour urine protein electrophoresis and immunofixation.  I will also obtain a skeletal survey at this time.  I will see him back in 2 weeks time to discuss  the results of his blood work and further  management   Thank you for this kind referral and the opportunity to participate in the care of this patient   Visit Diagnosis 1. Bence Jones protein present in urine     Dr. Randa Evens, MD, MPH [facility] [phone] 02/24/2018 2:03 PM

## 2018-02-27 LAB — KAPPA/LAMBDA LIGHT CHAINS
KAPPA, LAMDA LIGHT CHAIN RATIO: 0.29 (ref 0.26–1.65)
Kappa free light chain: 18.2 mg/L (ref 3.3–19.4)
LAMDA FREE LIGHT CHAINS: 62.7 mg/L — AB (ref 5.7–26.3)

## 2018-02-28 DIAGNOSIS — R803 Bence Jones proteinuria: Secondary | ICD-10-CM | POA: Diagnosis not present

## 2018-02-28 LAB — MULTIPLE MYELOMA PANEL, SERUM
ALBUMIN SERPL ELPH-MCNC: 3.3 g/dL (ref 2.9–4.4)
ALPHA 1: 0.2 g/dL (ref 0.0–0.4)
Albumin/Glob SerPl: 1.2 (ref 0.7–1.7)
Alpha2 Glob SerPl Elph-Mcnc: 0.6 g/dL (ref 0.4–1.0)
B-GLOBULIN SERPL ELPH-MCNC: 1.7 g/dL — AB (ref 0.7–1.3)
GAMMA GLOB SERPL ELPH-MCNC: 0.5 g/dL (ref 0.4–1.8)
GLOBULIN, TOTAL: 3 g/dL (ref 2.2–3.9)
IgA: 1365 mg/dL — ABNORMAL HIGH (ref 61–437)
IgG (Immunoglobin G), Serum: 603 mg/dL — ABNORMAL LOW (ref 700–1600)
IgM (Immunoglobulin M), Srm: 10 mg/dL — ABNORMAL LOW (ref 15–143)
M PROTEIN SERPL ELPH-MCNC: 1 g/dL — AB
Total Protein ELP: 6.3 g/dL (ref 6.0–8.5)

## 2018-03-01 ENCOUNTER — Other Ambulatory Visit: Payer: Self-pay

## 2018-03-01 DIAGNOSIS — R803 Bence Jones proteinuria: Secondary | ICD-10-CM

## 2018-03-02 LAB — IFE+PROTEIN ELECTRO, 24-HR UR
% BETA, Urine: 53.6 %
ALPHA 1 URINE: 2.5 %
Albumin, U: 22.2 %
Alpha 2, Urine: 6 %
GAMMA GLOBULIN URINE: 15.7 %
Total Protein, Urine-Ur/day: 57 mg/24 hr (ref 30–150)
Total Protein, Urine: 5.7 mg/dL
Total Volume: 1000

## 2018-03-10 ENCOUNTER — Inpatient Hospital Stay: Payer: Medicare HMO | Attending: Oncology | Admitting: Oncology

## 2018-03-10 ENCOUNTER — Encounter: Payer: Self-pay | Admitting: Oncology

## 2018-03-10 VITALS — BP 111/67 | HR 73 | Temp 97.6°F | Resp 18 | Ht 67.0 in | Wt 162.2 lb

## 2018-03-10 DIAGNOSIS — D472 Monoclonal gammopathy: Secondary | ICD-10-CM | POA: Diagnosis not present

## 2018-03-10 DIAGNOSIS — M810 Age-related osteoporosis without current pathological fracture: Secondary | ICD-10-CM | POA: Diagnosis not present

## 2018-03-10 DIAGNOSIS — G8929 Other chronic pain: Secondary | ICD-10-CM | POA: Insufficient documentation

## 2018-03-10 DIAGNOSIS — M545 Low back pain: Secondary | ICD-10-CM | POA: Insufficient documentation

## 2018-03-10 DIAGNOSIS — Z87891 Personal history of nicotine dependence: Secondary | ICD-10-CM | POA: Insufficient documentation

## 2018-03-10 DIAGNOSIS — I10 Essential (primary) hypertension: Secondary | ICD-10-CM | POA: Insufficient documentation

## 2018-03-10 NOTE — Progress Notes (Signed)
No new changes noted today 

## 2018-03-13 NOTE — Progress Notes (Signed)
Hematology/Oncology Consult note [facility]  Telephone:([phone] [phone]  Patient Care Team: [name], MD as PCP - General (Family Medicine) Brendolyn Patty, MD as Consulting Physician (Dermatology) Delana Meyer, Dolores Lory, MD as Consulting Physician (Vascular Surgery)   Name of the patient: Roy Hernandez  [id number]  Sep 22, 1927   Date of visit: 03/13/18  Diagnosis- IgA lambda MGUS  Chief complaint/ Reason for visit- discuss results of bloodwork  Heme/Onc history: patient is a 84 year old male with a past medical history significant for hypertension, hyperlipidemia, osteoporosis, cognitive dysfunction, left bundle branch block and aneurysm of his aorta.  He also recently underwent inguinal hernia surgery.  He has been referred to [place] for Bence-Jones proteins detected in the urine.  BMP from 02/08/2018 was within normal limits.  Urine immunofixation showed lambda type Bence-Jones proteins.CBC from 01/20/2018 showed white count of 5.6, H&H of 13.5/39.1 and a platelet count of 151.   Patient is here with his daughter today. He reports doing well overall he does have chronic back pain.  Following a fall a few years ago.  He still lives alone and is independent of his ADLs and IADLs.  Results of blood work from 02/24/2018 were as follows: CBC showed white count of 5.2, H&H of 13.1/38.2 with an MCV of 94.1 and a platelet count of 166.  CMP was within normal limits including kidney functions and calcium.  Serum free light chains showed elevated lambda of 62.7 abnormal kappa lambda light chain ratio of 0.29.  Myeloma panel showed elevated IgA of 1365 and M protein of 1 g which was IgM monoclonal lambda light chain specificity.  24-hour urine protein electrophoresis revealed 57 mg of protein but no M protein was observed and immunofixation was normal   Interval history-no acute events since the last visit.  He is doing well and denies any complaints today  other than chronic back pain and fatigue  ECOG PS- 1 Pain scale- 0 Opioid associated constipation- no  Review of systems- Review of Systems  Constitutional: Positive for malaise/fatigue. Negative for chills, fever and weight loss.  HENT: Negative for congestion, ear discharge and nosebleeds.   Eyes: Negative for blurred vision.  Respiratory: Negative for cough, hemoptysis, sputum production, shortness of breath and wheezing.   Cardiovascular: Negative for chest pain, palpitations, orthopnea and claudication.  Gastrointestinal: Negative for abdominal pain, blood in stool, constipation, diarrhea, heartburn, melena, nausea and vomiting.  Genitourinary: Negative for dysuria, flank pain, frequency, hematuria and urgency.  Musculoskeletal: Positive for back pain. Negative for joint pain and myalgias.  Skin: Negative for rash.  Neurological: Negative for dizziness, tingling, focal weakness, seizures, weakness and headaches.  Endo/Heme/Allergies: Does not bruise/bleed easily.  Psychiatric/Behavioral: Negative for depression and suicidal ideas. The patient does not have insomnia.       No Known Allergies   Past Medical History:  Diagnosis Date  . AAA (abdominal aortic aneurysm) ([place])   . Dementia   . Diabetes mellitus without complication (HCC)    diet controlled  . Glaucoma   . Hyperkalemia   . Hyperlipidemia   . Hypertension   . Inguinal hernia of right side without obstruction or gangrene   . Osteoporosis   . Peripheral vascular disease ([place])   . Vitamin D deficiency      Past Surgical History:  Procedure Laterality Date  . CATARACT EXTRACTION Bilateral   . INGUINAL HERNIA REPAIR Right 01/27/2018   Medium Ultra Pro mesh.  Surgeon: Robert Bellow, MD;  Location:  [facility] ORS;  Service: General;  Laterality: Right;  with mesh  . TOOTH EXTRACTION      Social History   Socioeconomic History  . Marital status: Widowed    Spouse name: [name]  . Number of children: 3  . Years  of education: Not on file  . Highest education level: 9th grade  Occupational History    Employer: [employer]  Social Needs  . Financial resource strain: Not hard at all  . Food insecurity:    Worry: Never true    Inability: Never true  . Transportation needs:    Medical: No    Non-medical: No  Tobacco Use  . Smoking status: Former Smoker    Packs/day: 1.00    Years: 34.00    Pack years: 34.00    Types: Cigarettes    Start date: 08/02/1945    Last attempt to quit: 05/26/1980    Years since quitting: 37.8  . Smokeless tobacco: Never Used  . Tobacco comment: smoking cessation materials not required  Substance and Sexual Activity  . Alcohol use: No    Alcohol/week: 0.0 standard drinks  . Drug use: No  . Sexual activity: Not Currently  Lifestyle  . Physical activity:    Days per week: 3 days    Minutes per session: 20 min  . Stress: Not at all  Relationships  . Social connections:    Talks on phone: More than three times a week    Gets together: Twice a week    Attends religious service: More than 4 times per year    Active member of club or organization: No    Attends meetings of clubs or organizations: Never    Relationship status: Widowed  . Intimate partner violence:    Fear of current or ex partner: No    Emotionally abused: No    Physically abused: No    Forced sexual activity: No  Other Topics Concern  . Not on file  Social History Narrative  . Not on file    Family History  Problem Relation Age of Onset  . Cancer Mother   . Hypertension Mother   . Diabetes Father   . Hyperlipidemia Father   . Diabetes Maternal Grandmother   . Diabetes Brother   . Kidney disease Brother        on dialysis  . Diabetes Sister   . Kidney disease Sister        dialysis  . Cancer Brother        esophageal  . Leukemia Sister      Current Outpatient Medications:  .  alendronate (FOSAMAX) 70 MG tablet, Take 1 tablet (70 mg total) by mouth once a week. Take with a full  glass of water on an empty stomach. (Patient taking differently: Take 70 mg by mouth every Monday. Take with a full glass of water on an empty stomach.), Disp: 12 tablet, Rfl: 3 .  aspirin EC 81 MG tablet, Take 81 mg by mouth at bedtime., Disp: , Rfl:  .  cholecalciferol (VITAMIN D) 1000 UNITS tablet, Take 1,000 Units by mouth daily. , Disp: , Rfl:  .  DM-Phenylephrine-Acetaminophen 10-5-325 MG CAPS, Take 2 tablets by mouth daily as needed (for allergies.). , Disp: , Rfl:  .  latanoprost (XALATAN) 0.005 % ophthalmic solution, Place 1 drop into both eyes at bedtime. , Disp: , Rfl:  .  simvastatin (ZOCOR) 5 MG tablet, Take 1 tablet (5 mg total) by mouth at bedtime., Disp:  90 tablet, Rfl: 1 .  timolol (TIMOPTIC) 0.5 % ophthalmic solution, Place 1 drop into both eyes daily. , Disp: , Rfl:  .  vitamin B-12 (CYANOCOBALAMIN) 500 MCG tablet, Take 1,000 mcg by mouth daily. , Disp: , Rfl:  .  Carboxymethylcellulose Sodium (LUBRICANT EYE DROPS PF OP), Place 1 drop into both eyes 3 (three) times daily as needed (for dry eyes.). , Disp: , Rfl:  .  donepezil (ARICEPT) 10 MG tablet, Take 1 tablet (10 mg total) by mouth at bedtime., Disp: 90 tablet, Rfl: 1 .  fluticasone (FLONASE) 50 MCG/ACT nasal spray, Place 2 sprays into both nostrils daily. (Patient not taking: Reported on 02/24/2018), Disp: 48 g, Rfl: 1 .  Menthol-Methyl Salicylate (MUSCLE RUB EX), Apply 1 application topically 4 (four) times daily as needed (for pain.). , Disp: , Rfl:  .  Naproxen Sodium 220 MG CAPS, Take 220 mg by mouth daily as needed (for pain.). , Disp: , Rfl:  .  ONE TOUCH ULTRA TEST test strip, TEST BLOOD GLUCOSE LEVEL ONCE A DAY. (Patient not taking: Reported on 02/24/2018), Disp: 90 each, Rfl: 1  Physical exam:  Vitals:   03/10/18 1018  BP: 111/67  Pulse: 73  Resp: 18  Temp: 97.6 F (36.4 C)  TempSrc: Tympanic  SpO2: 97%  Weight: 162 lb 3.2 oz (73.6 kg)  Height: 5' 7" (1.702 m)   Physical Exam  Constitutional: He is oriented  to person, place, and time. He appears well-developed and well-nourished.  He ambulates with a cane.  Does not appear to be in any acute distress.  HENT:  Head: Normocephalic and atraumatic.  Eyes: Pupils are equal, round, and reactive to light. EOM are normal.  Neck: Normal range of motion.  Cardiovascular: Normal rate, regular rhythm and normal heart sounds.  Pulmonary/Chest: Effort normal and breath sounds normal.  Abdominal: Soft. Bowel sounds are normal.  Neurological: He is alert and oriented to person, place, and time.  Skin: Skin is warm and dry.     CMP Latest Ref Rng & Units 02/24/2018  Glucose 70 - 99 mg/dL 112(H)  BUN 8 - 23 mg/dL 15  Creatinine 0.61 - 1.24 mg/dL 0.85  Sodium 135 - 145 mmol/L 138  Potassium 3.5 - 5.1 mmol/L 4.1  Chloride 98 - 111 mmol/L 103  CO2 22 - 32 mmol/L 26  Calcium 8.9 - 10.3 mg/dL 9.3  Total Protein 6.5 - 8.1 g/dL 6.6  Total Bilirubin 0.3 - 1.2 mg/dL 0.6  Alkaline Phos 38 - 126 U/L 64  AST 15 - 41 U/L 18  ALT 0 - 44 U/L 10   CBC Latest Ref Rng & Units 02/24/2018  WBC 3.8 - 10.6 K/uL 5.2  Hemoglobin 13.0 - 18.0 g/dL 13.1  Hematocrit 40.0 - 52.0 % 38.2(L)  Platelets 150 - 440 K/uL 166    No images are attached to the encounter.  Dg Bone Survey Met  Result Date: 02/24/2018 CLINICAL DATA:  Diabetes.  Hypertension.  Proteinuria. EXAM: METASTATIC BONE SURVEY COMPARISON:  Left hip radiographs 08/19/2015. MRI of the lumbar spine 04/04/2014. FINDINGS: No focal lytic or sclerotic lesions are present within the imaged skeleton. Multilevel degenerative changes are noted in cervical, thoracic, and lumbar spine. Joints are located without significant degenerative joint disease. A remote medial malleolar fracture is present on the right. Remote mid and lower thoracic compression fractures are present. Remote endplate fractures are present in the lumbar spine at L1, L3, and L4. Atherosclerotic calcifications are present in the aorta. Aorta  measures up to 4.2  cm. Atherosclerotic calcifications are also present in the carotid arteries. IMPRESSION: 1. No evidence for metastatic disease to the skeleton. 2. Compression fractures of the thoracic and lumbar spine are remote. 3. Aortic Atherosclerosis (ICD10-I70.0). Recommend followup by ultrasound in 1 year. This recommendation follows ACR consensus guidelines: White Paper of the ACR Incidental Findings Committee II on Vascular Findings. J Am Coll Radiol 2013; 10:789-794. 4. Carotid artery disease. Electronically Signed   By: San Morelle M.D.   On: 02/24/2018 15:57     Assessment and plan- Patient is a 84 y.o. male with IgA lambda plasma cell dyscrasia likely MGUS  I discussed the results of the blood work with the patient in detail.  Currently he does not have any evidence of endorgan damage such as anemia or renal failure hypercalcemia.  Bone survey also did not reveal any evidence of lytic lesions.  Results of multiple myeloma work-up revealed an M protein of 1 g IgA lambda.  Serum free light chain ratio was abnormal at 0.29 with an elevated lambda fraction of 62.7.  This is likely IgA lambda MGUS.  Typically a bone marrow biopsy would be indicated for any non-IgG MGUS along with an abnormal free light chain ratio.  Given his age I discussed with the patient and his daughter that options would be to proceed with a bone marrow biopsy to rule out overt multiple myeloma at this time which is a reasonable thing to consider especially given that he has a good performance status versus continuing to monitor his myeloma labs closely and consider doing a bone marrow biopsy if there is any further significant elevation of his free light chain ratio or M protein.  Patient daughter would like to discuss this with him in more detail and get back to [place].  I will see him back in 3 months with repeat CBC, CMP, myeloma panel and serum free light chains which would be done 1 week prior.  If patient decides to pursue a bone  marrow biopsy revealed make those arrangements accordingly and I will see him sooner after the results of his bone marrow biopsy are back   Total face to face encounter time for this patient visit was 30 min. >50% of the time was  spent in counseling and coordination of care.   Visit Diagnosis 1. MGUS (monoclonal gammopathy of unknown significance)      Dr. Randa Evens, MD, MPH [facility] at [facility] [phone] 03/13/2018 11:10 AM

## 2018-04-24 ENCOUNTER — Ambulatory Visit (INDEPENDENT_AMBULATORY_CARE_PROVIDER_SITE_OTHER): Payer: Medicare HMO

## 2018-04-24 DIAGNOSIS — Z23 Encounter for immunization: Secondary | ICD-10-CM

## 2018-04-24 NOTE — Progress Notes (Signed)
flu

## 2018-05-25 ENCOUNTER — Other Ambulatory Visit: Payer: Self-pay | Admitting: Family Medicine

## 2018-05-25 DIAGNOSIS — E7800 Pure hypercholesterolemia, unspecified: Secondary | ICD-10-CM

## 2018-05-25 NOTE — Telephone Encounter (Signed)
Copied from [place] [id number]. Topic: Quick Communication - Rx Refill/Question >> May 25, 2018 12:47 PM [place] Nail, [place] wrote: **Patient's daughter [name] calling and states that he is needing about 10 pills to be sent to the pharmacy to get him through until his order from [facility] gets there.**  Medication: simvastatin (ZOCOR) 5 MG tablet  Has the patient contacted their pharmacy? Yes.   (Agent: If no, request that the patient contact the pharmacy for the refill.) (Agent: If yes, when and what did the pharmacy advise?)  Preferred Pharmacy (with phone number or street name): [id number] [place], [place] - [address]: Please be advised that RX refills may take up to 3 business days. We ask that you follow-up with your pharmacy.

## 2018-05-28 MED ORDER — SIMVASTATIN 5 MG PO TABS: 5.0000 mg | ORAL_TABLET | Freq: Every day | ORAL | 0 refills | Status: DC

## 2018-06-01 ENCOUNTER — Other Ambulatory Visit: Payer: Self-pay | Admitting: Family Medicine

## 2018-06-01 DIAGNOSIS — E7800 Pure hypercholesterolemia, unspecified: Secondary | ICD-10-CM

## 2018-06-02 ENCOUNTER — Inpatient Hospital Stay: Payer: Medicare HMO | Attending: Oncology

## 2018-06-02 DIAGNOSIS — Z87891 Personal history of nicotine dependence: Secondary | ICD-10-CM | POA: Insufficient documentation

## 2018-06-02 DIAGNOSIS — Z79899 Other long term (current) drug therapy: Secondary | ICD-10-CM | POA: Insufficient documentation

## 2018-06-02 DIAGNOSIS — D472 Monoclonal gammopathy: Secondary | ICD-10-CM | POA: Diagnosis not present

## 2018-06-02 LAB — CBC WITH DIFFERENTIAL/PLATELET
Abs Immature Granulocytes: 0.02 10*3/uL (ref 0.00–0.07)
BASOS PCT: 1 %
Basophils Absolute: 0 10*3/uL (ref 0.0–0.1)
EOS PCT: 2 %
Eosinophils Absolute: 0.1 10*3/uL (ref 0.0–0.5)
HEMATOCRIT: 39.4 % (ref 39.0–52.0)
Hemoglobin: 12.9 g/dL — ABNORMAL LOW (ref 13.0–17.0)
IMMATURE GRANULOCYTES: 0 %
LYMPHS ABS: 1.1 10*3/uL (ref 0.7–4.0)
Lymphocytes Relative: 17 %
MCH: 31.5 pg (ref 26.0–34.0)
MCHC: 32.7 g/dL (ref 30.0–36.0)
MCV: 96.3 fL (ref 80.0–100.0)
MONO ABS: 0.5 10*3/uL (ref 0.1–1.0)
MONOS PCT: 8 %
NEUTROS PCT: 72 %
Neutro Abs: 4.8 10*3/uL (ref 1.7–7.7)
Platelets: 167 10*3/uL (ref 150–400)
RBC: 4.09 MIL/uL — ABNORMAL LOW (ref 4.22–5.81)
RDW: 12.4 % (ref 11.5–15.5)
WBC: 6.6 10*3/uL (ref 4.0–10.5)
nRBC: 0 % (ref 0.0–0.2)

## 2018-06-02 LAB — COMPREHENSIVE METABOLIC PANEL
ALBUMIN: 4 g/dL (ref 3.5–5.0)
ALT: 10 U/L (ref 0–44)
AST: 17 U/L (ref 15–41)
Alkaline Phosphatase: 71 U/L (ref 38–126)
Anion gap: 7 (ref 5–15)
BILIRUBIN TOTAL: 0.9 mg/dL (ref 0.3–1.2)
BUN: 16 mg/dL (ref 8–23)
CO2: 30 mmol/L (ref 22–32)
CREATININE: 0.9 mg/dL (ref 0.61–1.24)
Calcium: 9.6 mg/dL (ref 8.9–10.3)
Chloride: 100 mmol/L (ref 98–111)
GFR calc Af Amer: >60 mL/min (ref >60)
GFR calc non Af Amer: >60 mL/min (ref >60)
Glucose, Bld: 106 mg/dL — ABNORMAL HIGH (ref 70–99)
Potassium: 4.4 mmol/L (ref 3.5–5.1)
SODIUM: 137 mmol/L (ref 135–145)
TOTAL PROTEIN: 7.1 g/dL (ref 6.5–8.1)

## 2018-06-05 LAB — KAPPA/LAMBDA LIGHT CHAINS
Kappa free light chain: 19.1 mg/L (ref 3.3–19.4)
Kappa, lambda light chain ratio: 0.25 — ABNORMAL LOW (ref 0.26–1.65)
LAMDA FREE LIGHT CHAINS: 75.6 mg/L — AB (ref 5.7–26.3)

## 2018-06-06 LAB — HM DIABETES EYE EXAM

## 2018-06-07 LAB — MULTIPLE MYELOMA PANEL, SERUM
Albumin SerPl Elph-Mcnc: 3.7 g/dL (ref 2.9–4.4)
Albumin/Glob SerPl: 1.2 (ref 0.7–1.7)
Alpha 1: 0.2 g/dL (ref 0.0–0.4)
Alpha2 Glob SerPl Elph-Mcnc: 0.6 g/dL (ref 0.4–1.0)
B-Globulin SerPl Elph-Mcnc: 1.7 g/dL — ABNORMAL HIGH (ref 0.7–1.3)
Gamma Glob SerPl Elph-Mcnc: 0.6 g/dL (ref 0.4–1.8)
Globulin, Total: 3.1 g/dL (ref 2.2–3.9)
IGG (IMMUNOGLOBIN G), SERUM: 640 mg/dL — AB (ref 700–1600)
IGM (IMMUNOGLOBULIN M), SRM: 11 mg/dL — AB (ref 15–143)
IgA: 1484 mg/dL — ABNORMAL HIGH (ref 61–437)
M Protein SerPl Elph-Mcnc: 0.9 g/dL — ABNORMAL HIGH
TOTAL PROTEIN ELP: 6.8 g/dL (ref 6.0–8.5)

## 2018-06-09 ENCOUNTER — Encounter: Payer: Self-pay | Admitting: Oncology

## 2018-06-09 ENCOUNTER — Inpatient Hospital Stay (HOSPITAL_BASED_OUTPATIENT_CLINIC_OR_DEPARTMENT_OTHER): Payer: Medicare HMO | Admitting: Oncology

## 2018-06-09 ENCOUNTER — Encounter: Payer: Self-pay | Admitting: Family Medicine

## 2018-06-09 VITALS — BP 128/71 | HR 78 | Temp 98.3°F | Resp 18 | Ht 67.0 in | Wt 161.2 lb

## 2018-06-09 DIAGNOSIS — D472 Monoclonal gammopathy: Secondary | ICD-10-CM | POA: Diagnosis not present

## 2018-06-09 DIAGNOSIS — Z87891 Personal history of nicotine dependence: Secondary | ICD-10-CM

## 2018-06-09 NOTE — Progress Notes (Signed)
Hematology/Oncology Consult note [facility]  Telephone:([phone] [phone]  Patient Care Team: [name], MD as PCP - General (Family Medicine) Brendolyn Patty, MD as Consulting Physician (Dermatology) Delana Meyer, Dolores Lory, MD as Consulting Physician (Vascular Surgery)   Name of the patient: Roy Hernandez  [id number]  1928/06/22   Date of visit: 06/09/18  Diagnosis-  IgA lambda MGUS   Chief complaint/ Reason for visit- routine f/u of MGUS  Heme/Onc history: patient is a 84 year old male with a past medical history significant for hypertension, hyperlipidemia, osteoporosis, cognitive dysfunction, left bundle branch block and aneurysm of his aorta. He also recently underwent inguinal hernia surgery. He has been referred to [place] for Bence-Jones proteins detected in the urine. BMP from 02/08/2018 was within normal limits. Urine immunofixation showed lambda type Bence-Jones proteins.CBC from 01/20/2018 showed white count of 5.6, H&H of 13.5/39.1 and a platelet count of 151.   Patient is here with his daughter today. He reports doing well overall he does have chronic back pain.Following a fall a few years ago. He still lives alone and is independent of his ADLs and IADLs.  Results of blood work from 02/24/2018 were as follows: CBC showed white count of 5.2, H&H of 13.1/38.2 with an MCV of 94.1 and a platelet count of 166.  CMP was within normal limits including kidney functions and calcium.  Serum free light chains showed elevated lambda of 62.7 abnormal kappa lambda light chain ratio of 0.29.  Myeloma panel showed elevated IgA of 1365 and M protein of 1 g which was IgM monoclonal lambda light chain specificity.  24-hour urine protein electrophoresis revealed 57 mg of protein but no M protein was observed and immunofixation was normal   Interval history- appetite is fair. Weight is stable over ast 3 months. No falls. He ambulates with a  cane  ECOG PS- 2 Pain scale- 0 Opioid associated constipation- no  Review of systems- Review of Systems  Constitutional: Positive for malaise/fatigue. Negative for chills, fever and weight loss.  HENT: Negative for congestion, ear discharge and nosebleeds.   Eyes: Negative for blurred vision.  Respiratory: Negative for cough, hemoptysis, sputum production, shortness of breath and wheezing.   Cardiovascular: Negative for chest pain, palpitations, orthopnea and claudication.  Gastrointestinal: Negative for abdominal pain, blood in stool, constipation, diarrhea, heartburn, melena, nausea and vomiting.  Genitourinary: Negative for dysuria, flank pain, frequency, hematuria and urgency.  Musculoskeletal: Negative for back pain, joint pain and myalgias.  Skin: Negative for rash.  Neurological: Negative for dizziness, tingling, focal weakness, seizures, weakness and headaches.  Endo/Heme/Allergies: Does not bruise/bleed easily.  Psychiatric/Behavioral: Negative for depression and suicidal ideas. The patient does not have insomnia.        No Known Allergies   Past Medical History:  Diagnosis Date  . AAA (abdominal aortic aneurysm) ([place])   . Dementia ([place])   . Diabetes mellitus without complication (HCC)    diet controlled  . Glaucoma   . Hyperkalemia   . Hyperlipidemia   . Hypertension   . Inguinal hernia of right side without obstruction or gangrene   . Osteoporosis   . Peripheral vascular disease ([place])   . Vitamin D deficiency      Past Surgical History:  Procedure Laterality Date  . CATARACT EXTRACTION Bilateral   . INGUINAL HERNIA REPAIR Right 01/27/2018   Medium Ultra Pro mesh.  Surgeon: Robert Bellow, MD;  Location: [facility] ORS;  Service: General;  Laterality: Right;  with mesh  .  TOOTH EXTRACTION      Social History   Socioeconomic History  . Marital status: Widowed    Spouse name: [name]  . Number of children: 3  . Years of education: Not on file  . Highest  education level: 9th grade  Occupational History    Employer: [employer]  Social Needs  . Financial resource strain: Not hard at all  . Food insecurity:    Worry: Never true    Inability: Never true  . Transportation needs:    Medical: No    Non-medical: No  Tobacco Use  . Smoking status: Former Smoker    Packs/day: 1.00    Years: 34.00    Pack years: 34.00    Types: Cigarettes    Start date: 08/02/1945    Last attempt to quit: 05/26/1980    Years since quitting: 38.0  . Smokeless tobacco: Never Used  . Tobacco comment: smoking cessation materials not required  Substance and Sexual Activity  . Alcohol use: No    Alcohol/week: 0.0 standard drinks  . Drug use: No  . Sexual activity: Not Currently  Lifestyle  . Physical activity:    Days per week: 3 days    Minutes per session: 20 min  . Stress: Not at all  Relationships  . Social connections:    Talks on phone: More than three times a week    Gets together: Twice a week    Attends religious service: More than 4 times per year    Active member of club or organization: No    Attends meetings of clubs or organizations: Never    Relationship status: Widowed  . Intimate partner violence:    Fear of current or ex partner: No    Emotionally abused: No    Physically abused: No    Forced sexual activity: No  Other Topics Concern  . Not on file  Social History Narrative  . Not on file    Family History  Problem Relation Age of Onset  . Cancer Mother   . Hypertension Mother   . Diabetes Father   . Hyperlipidemia Father   . Diabetes Maternal Grandmother   . Diabetes Brother   . Kidney disease Brother        on dialysis  . Diabetes Sister   . Kidney disease Sister        dialysis  . Cancer Brother        esophageal  . Leukemia Sister      Current Outpatient Medications:  .  alendronate (FOSAMAX) 70 MG tablet, Take 1 tablet (70 mg total) by mouth once a week. Take with a full glass of water on an empty stomach.  (Patient taking differently: Take 70 mg by mouth every Monday. Take with a full glass of water on an empty stomach.), Disp: 12 tablet, Rfl: 3 .  aspirin EC 81 MG tablet, Take 81 mg by mouth at bedtime., Disp: , Rfl:  .  Carboxymethylcellulose Sodium (LUBRICANT EYE DROPS PF OP), Place 1 drop into both eyes 3 (three) times daily as needed (for dry eyes.). , Disp: , Rfl:  .  cholecalciferol (VITAMIN D) 1000 UNITS tablet, Take 1,000 Units by mouth daily. , Disp: , Rfl:  .  donepezil (ARICEPT) 10 MG tablet, Take 1 tablet (10 mg total) by mouth at bedtime., Disp: 90 tablet, Rfl: 1 .  latanoprost (XALATAN) 0.005 % ophthalmic solution, Place 1 drop into both eyes at bedtime. , Disp: , Rfl:  .  simvastatin (ZOCOR) 5 MG tablet, Take 1 tablet (5 mg total) by mouth at bedtime., Disp: 30 tablet, Rfl: 0 .  vitamin B-12 (CYANOCOBALAMIN) 500 MCG tablet, Take 1,000 mcg by mouth daily. , Disp: , Rfl:  .  DM-Phenylephrine-Acetaminophen 10-5-325 MG CAPS, Take 2 tablets by mouth daily as needed (for allergies.). , Disp: , Rfl:  .  fluticasone (FLONASE) 50 MCG/ACT nasal spray, Place 2 sprays into both nostrils daily. (Patient not taking: Reported on 02/24/2018), Disp: 48 g, Rfl: 1 .  Menthol-Methyl Salicylate (MUSCLE RUB EX), Apply 1 application topically 4 (four) times daily as needed (for pain.). , Disp: , Rfl:  .  Naproxen Sodium 220 MG CAPS, Take 220 mg by mouth daily as needed (for pain.). , Disp: , Rfl:  .  ONE TOUCH ULTRA TEST test strip, TEST BLOOD GLUCOSE LEVEL ONCE A DAY. (Patient not taking: Reported on 02/24/2018), Disp: 90 each, Rfl: 1 .  timolol (TIMOPTIC) 0.5 % ophthalmic solution, Place 1 drop into both eyes daily. , Disp: , Rfl:   Physical exam:  Vitals:   06/09/18 1135  BP: 128/71  Pulse: 78  Resp: 18  Temp: 98.3 F (36.8 C)  TempSrc: Tympanic  SpO2: 94%  Weight: 161 lb 3.2 oz (73.1 kg)  Height: '5\' 7"'$  (1.702 m)   Physical Exam  Constitutional: He is oriented to person, place, and time.  Thin  tall man in no acute distress  HENT:  Head: Normocephalic and atraumatic.  Eyes: Pupils are equal, round, and reactive to light. EOM are normal.  Neck: Normal range of motion.  Cardiovascular: Normal rate, regular rhythm and normal heart sounds.  Pulmonary/Chest: Effort normal and breath sounds normal.  Abdominal: Soft. Bowel sounds are normal.  Neurological: He is alert and oriented to person, place, and time.  Skin: Skin is warm and dry.     CMP Latest Ref Rng & Units 06/02/2018  Glucose 70 - 99 mg/dL 106(H)  BUN 8 - 23 mg/dL 16  Creatinine 0.61 - 1.24 mg/dL 0.90  Sodium 135 - 145 mmol/L 137  Potassium 3.5 - 5.1 mmol/L 4.4  Chloride 98 - 111 mmol/L 100  CO2 22 - 32 mmol/L 30  Calcium 8.9 - 10.3 mg/dL 9.6  Total Protein 6.5 - 8.1 g/dL 7.1  Total Bilirubin 0.3 - 1.2 mg/dL 0.9  Alkaline Phos 38 - 126 U/L 71  AST 15 - 41 U/L 17  ALT 0 - 44 U/L 10   CBC Latest Ref Rng & Units 06/02/2018  WBC 4.0 - 10.5 K/uL 6.6  Hemoglobin 13.0 - 17.0 g/dL 12.9(L)  Hematocrit 39.0 - 52.0 % 39.4  Platelets 150 - 400 K/uL 167    Assessment and plan- Patient is a 84 y.o. male with IgA lambda plasma cell dyscrasia likely MGUS. He is here for routine f/u.   Labs show that hemoglobin is stable between 12-13.  White count and platelet count is normal.  CMP shows normal renal functions.  No hypercalcemia.  Serum free light chain ratio has remained between 0.2-0.3.  Multiple myeloma panel also shows stable M protein of 0.9 g as compared to 3 months ago.  Overall there has been no significant change in his myeloma labs.  We had discussed the possibility of getting a bone marrow biopsy given that he has an IgA gammopathy as well as an abnormal free light chain ratio.  Patient and his daughter would like to hold off on that at this time.  We will continue to monitor his  myeloma labs every 3 months.  Repeat CBCs, CMP, myeloma panel and serum free light chains in 3 months in 6 months and I will see him back in 6  months   Visit Diagnosis 1. MGUS (monoclonal gammopathy of unknown significance)      Dr. Randa Evens, MD, MPH [facility] at [facility] [phone] 06/12/2018 8:22 AM

## 2018-06-16 ENCOUNTER — Encounter: Payer: Self-pay | Admitting: Family Medicine

## 2018-06-16 ENCOUNTER — Ambulatory Visit (INDEPENDENT_AMBULATORY_CARE_PROVIDER_SITE_OTHER): Payer: Medicare HMO | Admitting: Family Medicine

## 2018-06-16 VITALS — BP 118/66 | HR 84 | Temp 97.5°F | Resp 16 | Ht 67.0 in | Wt 161.0 lb

## 2018-06-16 DIAGNOSIS — E785 Hyperlipidemia, unspecified: Secondary | ICD-10-CM | POA: Diagnosis not present

## 2018-06-16 DIAGNOSIS — R4189 Other symptoms and signs involving cognitive functions and awareness: Secondary | ICD-10-CM

## 2018-06-16 DIAGNOSIS — D692 Other nonthrombocytopenic purpura: Secondary | ICD-10-CM

## 2018-06-16 DIAGNOSIS — E1169 Type 2 diabetes mellitus with other specified complication: Secondary | ICD-10-CM

## 2018-06-16 DIAGNOSIS — I714 Abdominal aortic aneurysm, without rupture, unspecified: Secondary | ICD-10-CM

## 2018-06-16 DIAGNOSIS — G629 Polyneuropathy, unspecified: Secondary | ICD-10-CM

## 2018-06-16 DIAGNOSIS — E538 Deficiency of other specified B group vitamins: Secondary | ICD-10-CM | POA: Diagnosis not present

## 2018-06-16 DIAGNOSIS — I739 Peripheral vascular disease, unspecified: Secondary | ICD-10-CM | POA: Diagnosis not present

## 2018-06-16 DIAGNOSIS — G3184 Mild cognitive impairment, so stated: Secondary | ICD-10-CM

## 2018-06-16 LAB — POCT GLYCOSYLATED HEMOGLOBIN (HGB A1C): HbA1c, POC (controlled diabetic range): 5.6 % (ref 0.0–7.0)

## 2018-06-16 MED ORDER — SIMVASTATIN 5 MG PO TABS: 5.0000 mg | ORAL_TABLET | Freq: Every day | ORAL | 1 refills | Status: DC

## 2018-06-16 MED ORDER — CYANOCOBALAMIN 1000 MCG/ML IJ SOLN
1000.0000 ug | Freq: Once | INTRAMUSCULAR | Status: AC
  Administered 2018-06-16: 1000 ug via INTRAMUSCULAR

## 2018-06-16 MED ORDER — DONEPEZIL HCL 10 MG PO TABS: 10.0000 mg | ORAL_TABLET | Freq: Every day | ORAL | 1 refills | Status: DC

## 2018-06-16 MED ORDER — GABAPENTIN 100 MG PO CAPS: 100.0000 mg | ORAL_CAPSULE | Freq: Every day | ORAL | 0 refills | Status: DC

## 2018-06-16 NOTE — Progress Notes (Signed)
Name: Roy Hernandez   MRN: [id number]    DOB: 30-Dec-1927   Date:06/16/2018       Progress Note  Subjective  Chief Complaint  Chief Complaint  Patient presents with  . Follow-up    4 mth f/u  . Medication Refill    was started on Glucerna  . Hypertension  . Hyperlipidemia  . AAA  . Osteoporosis  . Cognitive  . BBB    HPI  Daugther : Roy Hernandez is here with him today  HTN:off bp medications and is doing well.   Paresthesia: he has noticed numbness on both feet a few years ago, but getting worse.He was diagnosed with B12 deficiency and is taking B12 1000 mcg daily , however last B12 was towards low end of normal, we will start injection today and also gabapentin since he has cold sensation on both feet that is bothersome, wearing two socks and cannot feel warm.   DMII: not taking any medications, he has dyslipidemia, he has changed diet and has lost weight. No polyphagia, polydipsia or polyuria  Severedecrease in cognitive function: still lives alone, came in today with his daughter - Roy Hernandez - his CIT 6 has increased from 12 to 21 over the past year, MMS earlier this year was 43. He still drives to grocery stores, and girlfriend's house and church. Not getting lost. His daughter is paying his bills ( since his wife died in 10/08/2010) He has episodes that he repeats himself.He also gets name of his two daughter' mixed up but he catches himself. Taking Aricept since Summer of 2019 and no side effects, he wants to continue of medication   Osteoporosis with history of compression fractures spine: taking fosamax,, seen by Dr. Honor Junes and had positive of Bence Jones and was referral to hematologist/oncologist. He was not started on medication  Hyperlipidemia: taking simvastatin daily and denies side effects, no chest pain or palpitation. 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 Clayborn Bigness during pre-op clearance No chest pain, decrease in exercise tolerance or  palpitation Unchanged   Aneurysm Aorta and also PAD: sees Dr. Payton Mccallum and currently no claudication. Taking statin and aspirin   Senile purpura: stable   Patient Active Problem List   Diagnosis Date Noted  . 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 Left bundle-branch block 02/13/2018  . History of right inguinal hernia repair 12/16/2017  . Epiretinal membrane (ERM) of right eye 10/11/2017  . DJD (degenerative joint disease) 10/04/2017  . Hyperglycemia 10/04/2017  . Advanced stage glaucoma 10/04/2017  . Hypertension, benign 07/06/2017  . PAD (peripheral artery disease) ([place]) 10/19/2016  . Osteoporosis of femur without pathological fracture 11/25/2015  . AAA (abdominal aortic aneurysm) without rupture ([place]) 11/05/2014  . Abnormal ECG 11/05/2014  . At risk for falling 11/05/2014  . DD (diverticular disease) 11/05/2014  . Dyslipidemia 11/05/2014  . Failure of erection 11/05/2014  . Degenerative arthritis of lumbar spine 11/05/2014  . Compressed spine fracture ([place]) 11/05/2014  . Basal cell papilloma 11/05/2014  . Vitamin D deficiency 11/05/2014    Past Surgical History:  Procedure Laterality Date  . CATARACT EXTRACTION Bilateral   . INGUINAL HERNIA REPAIR Right 01/27/2018   Medium Ultra Pro mesh.  Surgeon: Robert Bellow, MD;  Location: [facility] ORS;  Service: General;  Laterality: Right;  with mesh  . TOOTH EXTRACTION      Family History  Problem Relation Age of Onset  . Cancer Mother   . Hypertension Mother   .  Diabetes Father   . Hyperlipidemia Father   . Diabetes Maternal Grandmother   . Diabetes Brother   . Kidney disease Brother        on dialysis  . Diabetes Sister   . Kidney disease Sister        dialysis  . Cancer Brother        esophageal  . Leukemia Sister     Social History   Socioeconomic History  . Marital status: Widowed    Spouse name: [name]  . Number of children: 3  . Years of education: Not on file  . Highest education level: 9th grade   Occupational History    Employer: [employer]  Social Needs  . Financial resource strain: Not hard at all  . Food insecurity:    Worry: Never true    Inability: Never true  . Transportation needs:    Medical: No    Non-medical: No  Tobacco Use  . Smoking status: Former Smoker    Packs/day: 1.00    Years: 34.00    Pack years: 34.00    Types: Cigarettes    Start date: 08/02/1945    Last attempt to quit: 05/26/1980    Years since quitting: 38.0  . Smokeless tobacco: Never Used  . Tobacco comment: smoking cessation materials not required  Substance and Sexual Activity  . Alcohol use: No    Alcohol/week: 0.0 standard drinks  . Drug use: No  . Sexual activity: Not Currently  Lifestyle  . Physical activity:    Days per week: 3 days    Minutes per session: 20 min  . Stress: Not at all  Relationships  . Social connections:    Talks on phone: More than three times a week    Gets together: Twice a week    Attends religious service: More than 4 times per year    Active member of club or organization: No    Attends meetings of clubs or organizations: Never    Relationship status: Widowed  . Intimate partner violence:    Fear of current or ex partner: No    Emotionally abused: No    Physically abused: No    Forced sexual activity: No  Other Topics Concern  . Not on file  Social History Narrative  . Not on file     Current Outpatient Medications:  .  alendronate (FOSAMAX) 70 MG tablet, Take 1 tablet (70 mg total) by mouth once a week. Take with a full glass of water on an empty stomach. (Patient taking differently: Take 70 mg by mouth every Monday. Take with a full glass of water on an empty stomach.), Disp: 12 tablet, Rfl: 3 .  aspirin EC 81 MG tablet, Take 81 mg by mouth at bedtime., Disp: , Rfl:  .  Carboxymethylcellulose Sodium (LUBRICANT EYE DROPS PF OP), Place 1 drop into both eyes 3 (three) times daily as needed (for dry eyes.). , Disp: , Rfl:  .  cholecalciferol  (VITAMIN D) 1000 UNITS tablet, Take 1,000 Units by mouth daily. , Disp: , Rfl:  .  DM-Phenylephrine-Acetaminophen 10-5-325 MG CAPS, Take 2 tablets by mouth daily as needed (for allergies.). , Disp: , Rfl:  .  donepezil (ARICEPT) 10 MG tablet, Take 1 tablet (10 mg total) by mouth at bedtime., Disp: 90 tablet, Rfl: 1 .  fluticasone (FLONASE) 50 MCG/ACT nasal spray, Place 2 sprays into both nostrils daily. (Patient not taking: Reported on 02/24/2018), Disp: 48 g, Rfl: 1 .  latanoprost (XALATAN) 0.005 % ophthalmic solution, Place 1 drop into both eyes at bedtime. , Disp: , Rfl:  .  Menthol-Methyl Salicylate (MUSCLE RUB EX), Apply 1 application topically 4 (four) times daily as needed (for pain.). , Disp: , Rfl:  .  Naproxen Sodium 220 MG CAPS, Take 220 mg by mouth daily as needed (for pain.). , Disp: , Rfl:  .  ONE TOUCH ULTRA TEST test strip, TEST BLOOD GLUCOSE LEVEL ONCE A DAY. (Patient not taking: Reported on 02/24/2018), Disp: 90 each, Rfl: 1 .  simvastatin (ZOCOR) 5 MG tablet, Take 1 tablet (5 mg total) by mouth at bedtime., Disp: 30 tablet, Rfl: 0 .  timolol (TIMOPTIC) 0.5 % ophthalmic solution, Place 1 drop into both eyes daily. , Disp: , Rfl:  .  vitamin B-12 (CYANOCOBALAMIN) 500 MCG tablet, Take 1,000 mcg by mouth daily. , Disp: , Rfl:   No Known Allergies  I personally reviewed active problem list, medication list, allergies, family history, social history with the patient/caregiver today.   ROS  Constitutional: Negative for fever or weight change.  Respiratory: Negative for cough and shortness of breath.   Cardiovascular: Negative for chest pain or palpitations.  Gastrointestinal: Negative for abdominal pain, no bowel changes.  Musculoskeletal: Negative for gait problem or joint swelling.  Skin: Negative for rash.  Neurological: Negative for dizziness or headache.  No other specific complaints in a complete review of systems (except as listed in HPI above).  Objective  Vitals:    06/16/18 1017  BP: 118/66  Pulse: 84  Resp: 16  Temp: (!) 97.5 F (36.4 C)  TempSrc: Oral  SpO2: 97%  Weight: 161 lb (73 kg)  Height: '5\' 7"'  (1.702 m)    Body mass index is 25.22 kg/m.  Physical Exam  Constitutional: Patient appears well-developed and well-nourished. No distress.  HEENT: head atraumatic, normocephalic, pupils equal and reactive to light,  neck supple, throat within normal limits Cardiovascular: Normal rate, regular rhythm and normal heart sounds.  No murmur heard. No BLE edema. Pulmonary/Chest: Effort normal and breath sounds normal. No respiratory distress. Abdominal: Soft.  There is no tenderness. Psychiatric: Patient has a normal mood and affect. behavior is normal. Judgment and thought content normal.  Recent Results (from the past 2160 hour(s))  Multiple Myeloma Panel (SPEP&IFE w/QIG)     Status: Abnormal   Collection Time: 06/02/18 10:38 AM  Result Value Ref Range   IgG (Immunoglobin G), Serum 640 (L) 700 - 1,600 mg/dL   IgA 1,484 (H) 61 - 437 mg/dL    Comment: (NOTE) Results confirmed on dilution.    IgM (Immunoglobulin M), Srm 11 (L) 15 - 143 mg/dL    Comment: Result confirmed on concentration.   Total Protein ELP 6.8 6.0 - 8.5 g/dL   Albumin SerPl Elph-Mcnc 3.7 2.9 - 4.4 g/dL   Alpha 1 0.2 0.0 - 0.4 g/dL   Alpha2 Glob SerPl Elph-Mcnc 0.6 0.4 - 1.0 g/dL   B-Globulin SerPl Elph-Mcnc 1.7 (H) 0.7 - 1.3 g/dL   Gamma Glob SerPl Elph-Mcnc 0.6 0.4 - 1.8 g/dL   M Protein SerPl Elph-Mcnc 0.9 (H) Not Observed g/dL   Globulin, Total 3.1 2.2 - 3.9 g/dL   Albumin/Glob SerPl 1.2 0.7 - 1.7   IFE 1 Comment     Comment: (NOTE) Immunofixation shows IgA monoclonal protein with lambda light chain specificity.    Please Note Comment     Comment: (NOTE) Protein electrophoresis scan will follow via computer, mail, or courier delivery. Performed At:  [facility] [place], [place] [id number] Rush Farmer MD KP:[id number]   Kappa/lambda  light chains     Status: Abnormal   Collection Time: 06/02/18 10:38 AM  Result Value Ref Range   Kappa free light chain 19.1 3.3 - 19.4 mg/L   Lamda free light chains 75.6 (H) 5.7 - 26.3 mg/L   Kappa, lamda light chain ratio 0.25 (L) 0.26 - 1.65    Comment: (NOTE) Performed At: [facility] [place], [place] [id number] Rush Farmer MD EO:[id number]   Comprehensive metabolic panel     Status: Abnormal   Collection Time: 06/02/18 10:38 AM  Result Value Ref Range   Sodium 137 135 - 145 mmol/L   Potassium 4.4 3.5 - 5.1 mmol/L   Chloride 100 98 - 111 mmol/L   CO2 30 22 - 32 mmol/L   Glucose, Bld 106 (H) 70 - 99 mg/dL   BUN 16 8 - 23 mg/dL   Creatinine, Ser 0.90 0.61 - 1.24 mg/dL   Calcium 9.6 8.9 - 10.3 mg/dL   Total Protein 7.1 6.5 - 8.1 g/dL   Albumin 4.0 3.5 - 5.0 g/dL   AST 17 15 - 41 U/L   ALT 10 0 - 44 U/L   Alkaline Phosphatase 71 38 - 126 U/L   Total Bilirubin 0.9 0.3 - 1.2 mg/dL   GFR calc non Af Amer >60 >60 mL/min   GFR calc Af Amer >60 >60 mL/min    Comment: (NOTE) The eGFR has been calculated using the CKD EPI equation. This calculation has not been validated in all clinical situations. eGFR's persistently <60 mL/min signify possible Chronic Kidney Disease.    Anion gap 7 5 - 15    Comment: Performed at [facility], [address] [place], [place] [address]  CBC with Differential     Status: Abnormal   Collection Time: 06/02/18 10:38 AM  Result Value Ref Range   WBC 6.6 4.0 - 10.5 K/uL   RBC 4.09 (L) 4.22 - 5.81 MIL/uL   Hemoglobin 12.9 (L) 13.0 - 17.0 g/dL   HCT 39.4 39.0 - 52.0 %   MCV 96.3 80.0 - 100.0 fL   MCH 31.5 26.0 - 34.0 pg   MCHC 32.7 30.0 - 36.0 g/dL   RDW 12.4 11.5 - 15.5 %   Platelets 167 150 - 400 K/uL   nRBC 0.0 0.0 - 0.2 %   Neutrophils Relative % 72 %   Neutro Abs 4.8 1.7 - 7.7 K/uL   Lymphocytes Relative 17 %   Lymphs Abs 1.1 0.7 - 4.0 K/uL   Monocytes Relative 8 %   Monocytes Absolute 0.5 0.1 - 1.0  K/uL   Eosinophils Relative 2 %   Eosinophils Absolute 0.1 0.0 - 0.5 K/uL   Basophils Relative 1 %   Basophils Absolute 0.0 0.0 - 0.1 K/uL   Immature Granulocytes 0 %   Abs Immature Granulocytes 0.02 0.00 - 0.07 K/uL    Comment: Performed at [facility], [address] [place], [place] [address]  HM DIABETES EYE EXAM     Status: None   Collection Time: 06/06/18 12:00 AM  Result Value Ref Range   HM Diabetic Eye Exam No Retinopathy No Retinopathy    Comment: [facility]- Dr. Wallace Going rtn in 6 mths glaucoma f/u     PHQ2/9: Depression screen [facility] 2/9 06/16/2018 02/13/2018 10/04/2017 10/04/2017 09/08/2017  Decreased Interest 0 0 0 0 0  Down, Depressed, Hopeless 0 0 0  0 0  PHQ - 2 Score 0 0 0 0 0  Altered sleeping 1 - 0 - -  Tired, decreased energy 1 - 0 - -  Change in appetite 0 - 0 - -  Feeling bad or failure about yourself  0 - 0 - -  Trouble concentrating 0 - 0 - -  Moving slowly or fidgety/restless 0 - 0 - -  Suicidal thoughts 0 - 0 - -  PHQ-9 Score 2 - 0 - -  Difficult doing work/chores Not difficult at all - Not difficult at all - -     Fall Risk: Fall Risk  06/16/2018 02/13/2018 10/04/2017 09/08/2017 07/06/2017  Falls in the past year? 0 No Yes No No  Number falls in past yr: - - 2 or more - -  Injury with Fall? - - No - -  Risk Factor Category  - - High Fall Risk - -  Risk for fall due to : - - History of fall(s);Impaired balance/gait;Impaired vision - -  Risk for fall due to: Comment - - slow gait, weak at times when walking, drags feet; wears glasses - -  Follow up - - Falls evaluation completed;Education provided;Falls prevention discussed - -     Functional Status Survey: Is the patient deaf or have difficulty hearing?: No Does the patient have difficulty seeing, even when wearing glasses/contacts?: No Does the patient have difficulty concentrating, remembering, or making decisions?: No Does the patient have difficulty walking or climbing stairs?: Yes Does the  patient have difficulty dressing or bathing?: No Does the patient have difficulty doing errands alone such as visiting a doctor's office or shopping?: No    Assessment & Plan  1. Dyslipidemia associated with type 2 diabetes mellitus (HCC)  - POCT HgB A1C  2. PAD (peripheral artery disease) ([place])   3. Senile purpura (HCC)  stable  4. B12 deficiency  - cyanocobalamin ((VITAMIN B-12)) injection 1,000 mcg  5. Abdominal aortic aneurysm (AAA) without rupture (HCC)  On statin and aspirin   6. Mild cognitive impairment   7. Persistent cognitive impairment  - donepezil (ARICEPT) 10 MG tablet; Take 1 tablet (10 mg total) by mouth at bedtime.  Dispense: 90 tablet; Refill: 1  8. Dyslipidemia  - simvastatin (ZOCOR) 5 MG tablet; Take 1 tablet (5 mg total) by mouth at bedtime.  Dispense: 90 tablet; Refill: 1  9. Neuropathy  - gabapentin (NEURONTIN) 100 MG capsule; Take 1-3 capsules (100-300 mg total) by mouth at bedtime. Go up by one cap every 3 days, max of 3 per night  Dispense: 90 capsule; Refill: 0

## 2018-06-28 ENCOUNTER — Encounter: Payer: Self-pay | Admitting: Family Medicine

## 2018-07-08 ENCOUNTER — Other Ambulatory Visit: Payer: Self-pay | Admitting: Family Medicine

## 2018-07-08 DIAGNOSIS — G629 Polyneuropathy, unspecified: Secondary | ICD-10-CM

## 2018-07-08 NOTE — Telephone Encounter (Signed)
How many is he taking?

## 2018-07-10 NOTE — Telephone Encounter (Signed)
He needs to increase dose to see if it works, once at 300 mg he will need to titrate it down

## 2018-07-10 NOTE — Telephone Encounter (Signed)
Spoke with [employer] and she states patient is not about to tell much of a different. Currently patient is taking two Gabapentin and has not tried the three capsules yet. However, if Roy Hernandez symptoms do not remarkably change in the next week she is going to take him off of it. Does she need to titrate it down?

## 2018-07-13 NOTE — Telephone Encounter (Signed)
Spoke with [employer] and she did not increase Gabapentin to 3 capsules daily because he was very lethargic on the two daily. Patient daughter states his memory was foggy and his leg pain did not improve so she is slowly stopping him on Gabapentin and has him down to one daily now.

## 2018-07-15 ENCOUNTER — Encounter: Payer: Self-pay | Admitting: Family Medicine

## 2018-07-16 NOTE — Telephone Encounter (Signed)
I will discontinue medication

## 2018-08-22 ENCOUNTER — Emergency Department
Admission: EM | Admit: 2018-08-22 | Discharge: 2018-08-22 | Disposition: A | Discharge status: Home / Self Care | Payer: Medicare HMO | Attending: Emergency Medicine | Admitting: Emergency Medicine

## 2018-08-22 ENCOUNTER — Emergency Department: Payer: Medicare HMO

## 2018-08-22 ENCOUNTER — Encounter: Payer: Self-pay | Admitting: Emergency Medicine

## 2018-08-22 ENCOUNTER — Ambulatory Visit: Payer: Self-pay | Admitting: *Deleted

## 2018-08-22 DIAGNOSIS — S2232XA Fracture of one rib, left side, initial encounter for closed fracture: Secondary | ICD-10-CM | POA: Diagnosis not present

## 2018-08-22 DIAGNOSIS — Y999 Unspecified external cause status: Secondary | ICD-10-CM | POA: Diagnosis not present

## 2018-08-22 DIAGNOSIS — Z79899 Other long term (current) drug therapy: Secondary | ICD-10-CM | POA: Diagnosis not present

## 2018-08-22 DIAGNOSIS — Z7982 Long term (current) use of aspirin: Secondary | ICD-10-CM | POA: Insufficient documentation

## 2018-08-22 DIAGNOSIS — F0390 Unspecified dementia without behavioral disturbance: Secondary | ICD-10-CM | POA: Insufficient documentation

## 2018-08-22 DIAGNOSIS — I10 Essential (primary) hypertension: Secondary | ICD-10-CM | POA: Insufficient documentation

## 2018-08-22 DIAGNOSIS — E119 Type 2 diabetes mellitus without complications: Secondary | ICD-10-CM | POA: Insufficient documentation

## 2018-08-22 DIAGNOSIS — S299XXA Unspecified injury of thorax, initial encounter: Secondary | ICD-10-CM | POA: Diagnosis present

## 2018-08-22 DIAGNOSIS — Y929 Unspecified place or not applicable: Secondary | ICD-10-CM | POA: Insufficient documentation

## 2018-08-22 DIAGNOSIS — W1789XA Other fall from one level to another, initial encounter: Secondary | ICD-10-CM | POA: Insufficient documentation

## 2018-08-22 DIAGNOSIS — F17200 Nicotine dependence, unspecified, uncomplicated: Secondary | ICD-10-CM | POA: Diagnosis not present

## 2018-08-22 DIAGNOSIS — Y939 Activity, unspecified: Secondary | ICD-10-CM | POA: Insufficient documentation

## 2018-08-22 NOTE — ED Provider Notes (Signed)
[facility] Emergency Department Provider Note  ____________________________________________   First MD Initiated Contact with Patient 08/22/18 [phone]     (approximate)  I have reviewed the triage vital signs and the nursing notes.   HISTORY  Chief Complaint Fall and Rib Injury    HPI Roy Hernandez is a 85 y.o. male presents emergency department stating that he tripped when he tried to get up from his chair.  He fell hitting his left side of his back and his ribs.  Injury occurred last evening.  He denies hitting his head.  He denies headache.  It was an unwitnessed fall.  His family member states he has been able to walk since this happened.   She states he is also been acting normally for his baseline.   Past Medical History:  Diagnosis Date  . AAA (abdominal aortic aneurysm) ([place])   . Dementia ([place])   . Diabetes mellitus without complication (HCC)    diet controlled  . Glaucoma   . Hyperkalemia   . Hyperlipidemia   . Hypertension   . Inguinal hernia of right side without obstruction or gangrene   . Osteoporosis   . Peripheral vascular disease ([place])   . Vitamin D deficiency     Patient Active Problem List   Diagnosis Date Noted  . Neuropathy 06/16/2018  . 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 Left bundle-branch block 02/13/2018  . History of right inguinal hernia repair 12/16/2017  . Epiretinal membrane (ERM) of right eye 10/11/2017  . DJD (degenerative joint disease) 10/04/2017  . Hyperglycemia 10/04/2017  . Advanced stage glaucoma 10/04/2017  . Hypertension, benign 07/06/2017  . PAD (peripheral artery disease) ([place]) 10/19/2016  . Osteoporosis of femur without pathological fracture 11/25/2015  . AAA (abdominal aortic aneurysm) without rupture ([place]) 11/05/2014  . Abnormal ECG 11/05/2014  . At risk for falling 11/05/2014  . DD (diverticular disease) 11/05/2014  . Dyslipidemia 11/05/2014  . Failure of erection 11/05/2014  . Degenerative  arthritis of lumbar spine 11/05/2014  . Compressed spine fracture ([place]) 11/05/2014  . Basal cell papilloma 11/05/2014  . Vitamin D deficiency 11/05/2014    Past Surgical History:  Procedure Laterality Date  . CATARACT EXTRACTION Bilateral   . INGUINAL HERNIA REPAIR Right 01/27/2018   Medium Ultra Pro mesh.  Surgeon: Robert Bellow, MD;  Location: [facility] ORS;  Service: General;  Laterality: Right;  with mesh  . TOOTH EXTRACTION      Prior to Admission medications   Medication Sig Start Date End Date Taking? Authorizing Provider  alendronate (FOSAMAX) 70 MG tablet Take 1 tablet (70 mg total) by mouth once a week. Take with a full glass of water on an empty stomach. Patient taking differently: Take 70 mg by mouth every Monday. Take with a full glass of water on an empty stomach. 10/04/17   [name], MD  aspirin EC 81 MG tablet Take 81 mg by mouth at bedtime.    [provider]  Carboxymethylcellulose Sodium (LUBRICANT EYE DROPS PF OP) Place 1 drop into both eyes 3 (three) times daily as needed (for dry eyes.).  11/11/17   [provider]  cholecalciferol (VITAMIN D) 1000 UNITS tablet Take 1,000 Units by mouth daily.  11/26/11   Roselee Nova, MD  donepezil (ARICEPT) 10 MG tablet Take 1 tablet (10 mg total) by mouth at bedtime. 06/16/18   [name], MD  fluticasone (FLONASE) 50 MCG/ACT nasal spray Place 2 sprays into both nostrils daily.  Patient not taking: Reported on 02/24/2018 11/02/17   [name], MD  gabapentin (NEURONTIN) 100 MG capsule Take 1-3 capsules (100-300 mg total) by mouth at bedtime. Go up by one cap every 3 days, max of 3 per night 06/16/18   Sowles, Drue Stager, MD  latanoprost (XALATAN) 0.005 % ophthalmic solution Place 1 drop into both eyes at bedtime.     [provider]  Menthol-Methyl Salicylate (MUSCLE RUB EX) Apply 1 application topically 4 (four) times daily as needed (for pain.).  08/11/16   [provider]  Naproxen  Sodium 220 MG CAPS Take 220 mg by mouth daily as needed (for pain.).  08/11/16   [provider]  ONE TOUCH ULTRA TEST test strip TEST BLOOD GLUCOSE LEVEL ONCE A DAY. Patient not taking: Reported on 02/24/2018 07/15/16   Roselee Nova, MD  simvastatin (ZOCOR) 5 MG tablet Take 1 tablet (5 mg total) by mouth at bedtime. 06/16/18   [name], MD  timolol (TIMOPTIC) 0.5 % ophthalmic solution Place 1 drop into both eyes daily.  04/28/15   [provider]  vitamin B-12 (CYANOCOBALAMIN) 500 MCG tablet Take 1,000 mcg by mouth daily.     [provider]    Allergies Patient has no known allergies.  Family History  Problem Relation Age of Onset  . Cancer Mother   . Hypertension Mother   . Diabetes Father   . Hyperlipidemia Father   . Diabetes Maternal Grandmother   . Diabetes Brother   . Kidney disease Brother        on dialysis  . Diabetes Sister   . Kidney disease Sister        dialysis  . Cancer Brother        esophageal  . Leukemia Sister     Social History Social History   Tobacco Use  . Smoking status: Former Smoker    Packs/day: 1.00    Years: 34.00    Pack years: 34.00    Types: Cigarettes    Start date: 08/02/1945    Last attempt to quit: 05/26/1980    Years since quitting: 38.2  . Smokeless tobacco: Never Used  . Tobacco comment: smoking cessation materials not required  Substance Use Topics  . Alcohol use: No    Alcohol/week: 0.0 standard drinks  . Drug use: No    Review of Systems  Constitutional: No fever/chills Eyes: No visual changes. ENT: No sore throat. Respiratory: Denies cough Genitourinary: Negative for dysuria. Musculoskeletal: Positive for left rib and for back pain. Skin: Negative for rash.    ____________________________________________   PHYSICAL EXAM:  VITAL SIGNS: ED Triage Vitals  Enc Vitals Group     BP --      Pulse Rate 08/22/18 0900 89     Resp 08/22/18 0900 18     Temp 08/22/18 0900 (!) 97.5  F (36.4 C)     Temp Source 08/22/18 0900 Oral     SpO2 08/22/18 0900 94 %     Weight 08/22/18 0903 160 lb (72.6 kg)     Height 08/22/18 0903 5\' 7"  (1.702 m)     Head Circumference --      Peak Flow --      Pain Score 08/22/18 0903 4     Pain Loc --      Pain Edu? --      Excl. in [place]? --     Constitutional: Alert and oriented. Well appearing and in no acute distress. Eyes:  Conjunctivae are normal.  Head: Atraumatic. Nose: No congestion/rhinnorhea. Mouth/Throat: Mucous membranes are moist.   Neck:  supple no lymphadenopathy noted Cardiovascular: Normal rate, regular rhythm. Heart sounds are normal Respiratory: Normal respiratory effort.  No retractions, lungs c t a, left ribs are tender Abd: soft nontender bs normal all 4 quad GU: deferred Musculoskeletal: FROM all extremities, warm and well perfused, thoracic and lumbar spine are mildly tender, C-spine is nontender Neurologic:  Normal speech and language.  Skin:  Skin is warm, dry and intact. No rash noted. Psychiatric: Mood and affect are normal. Speech and behavior are normal.  ____________________________________________   LABS (all labs ordered are listed, but only abnormal results are displayed)  Labs Reviewed - No data to display ____________________________________________   ____________________________________________  RADIOLOGY  X-ray of the left ribs shows rib fracture of the 10th rib CT of the T-spine and L spine are negative for any acute abnormalities.  Compression fractures appear stable from 1 year ago  ____________________________________________   PROCEDURES  Procedure(s) performed: No  Procedures    ____________________________________________   INITIAL IMPRESSION / ASSESSMENT AND PLAN / ED COURSE  Pertinent labs & imaging results that were available during my care of the patient were reviewed by me and considered in my medical decision making (see chart for details).   Patient is  85 year old male presents emergency department with his family member.  They state that he fell while standing up from his recliner last night.  No head injury.  He is complaining of left rib pain and low back pain  Physical exam shows patient appears well.  He is able to answer all my questions appropriately.  Left ribs are tender, lumbar spine and T-spine are mildly tender, no abdominal tenderness is noted  X-ray of the left ribs shows a fracture of the left 10th rib And CT of the T-spine and lumbar spine are negative for any acute abnormalities.  Discussed the test results with the patient and his family member.  They are to follow-up with his regular doctor concerning his AAA.  She states they have an appointment with Dr. Delana Meyer next week for follow-up.  He is monitored every 6 months.  They are to follow-up with his regular doctor if Tylenol/ibuprofen are not helping with pain.  Return to the emergency department if worsening.  They both state they understand and will comply.  Patient was discharged in stable condition.     As part of my medical decision making, I reviewed the following data within the [place] History obtained from family, Nursing notes reviewed and incorporated, Old chart reviewed, Radiograph reviewed see above, Notes from prior ED visits and [place] Controlled Substance Database  ____________________________________________   FINAL CLINICAL IMPRESSION(S) / ED DIAGNOSES  Final diagnoses:  Closed fracture of one rib of left side, initial encounter      NEW MEDICATIONS STARTED DURING THIS VISIT:  Discharge Medication List as of 08/22/2018 10:37 AM       Note:  This document was prepared using Dragon voice recognition software and may include unintentional dictation errors.    Versie Starks, PA-C 08/22/18 1316    Eula Listen, MD 08/22/18 [phone]

## 2018-08-22 NOTE — ED Notes (Signed)
See triage note. States he tripped when is tried to get up from chair  [place]  Hitting left side of back and rib area  This happened last pm

## 2018-08-22 NOTE — ED Triage Notes (Signed)
Got up from recliner last night and ?tripped over something.   Says his balance is not good early in am.  But he fell and injured left side back.  Says  Hurts to take deep breath so worried about his ribs.

## 2018-08-22 NOTE — Discharge Instructions (Addendum)
Follow-up with your regular doctor if not better in 5 to 7 days.  Return emergency department if worsening symptoms.  Return if any abdominal pain.  Take Tylenol for pain as needed.

## 2018-08-22 NOTE — Telephone Encounter (Signed)
Pt and his daughter, Roy Hernandez, called because the pt fell this morning and hit his left rib area; the pt was getting up going to the bathroom and fell and hit his left side; he complains of pain when taking a breath in; the pt has not taken any OTC medication for pain; recommendations made per nurse triage protocol; the patient's daugther states she will take the pt to the ED/urgent care because she feels like the patient will need an xray; notified Melissa; will route to office for notification of this encounter.  Reason for Disposition . [1] High-risk adult (e.g., age > 8, osteoporosis, chronic steroid use) AND [2] still hurts  Answer Assessment - Initial Assessment Questions 1. MECHANISM: "How did the injury happen?"     Pt fell this morning 2. ONSET: "When did the injury happen?" (Minutes or hours ago)     08/22/2018 at 0400 3. LOCATION: "Where on the chest is the injury located?"    Left rib area 4. APPEARANCE: "What does the injury look like?"     No bleeding or bruising 5. BLEEDING: "Is there any bleeding now? If so, ask: How long has it been bleeding?"     no 6. SEVERITY: "Any difficulty with breathing?"     mild 7. SIZE: For cuts, bruises, or swelling, ask: "How large is it?" (e.g., inches or centimeters)     no 8. PAIN: "Is there pain?" If so, ask: "How bad is the pain?"   (e.g., Scale 1-10; or mild, moderate, severe)     Pain rated 4-5 out of 10 9. TETANUS: For any breaks in the skin, ask: "When was the last tetanus booster?"     n/a 10. PREGNANCY: "Is there any chance you are pregnant?" "When was your last menstrual period?"       n/a  Protocols used: CHEST INJURY-A-AH

## 2018-08-22 NOTE — ED Notes (Signed)
First Nurse Note: Daughter states patient fell getting up from recliner this AM onto carpeted floor.  Alert and oriented, sitting in WC.

## 2018-08-28 ENCOUNTER — Encounter: Payer: Self-pay | Admitting: Family Medicine

## 2018-09-01 ENCOUNTER — Ambulatory Visit (INDEPENDENT_AMBULATORY_CARE_PROVIDER_SITE_OTHER): Payer: Medicare HMO | Admitting: Family Medicine

## 2018-09-01 ENCOUNTER — Encounter: Payer: Self-pay | Admitting: Family Medicine

## 2018-09-01 VITALS — BP 118/74 | HR 89 | Temp 97.2°F | Resp 16 | Ht 67.0 in | Wt 157.0 lb

## 2018-09-01 DIAGNOSIS — R3911 Hesitancy of micturition: Secondary | ICD-10-CM

## 2018-09-01 DIAGNOSIS — D692 Other nonthrombocytopenic purpura: Secondary | ICD-10-CM

## 2018-09-01 DIAGNOSIS — I739 Peripheral vascular disease, unspecified: Secondary | ICD-10-CM | POA: Diagnosis not present

## 2018-09-01 DIAGNOSIS — E1169 Type 2 diabetes mellitus with other specified complication: Secondary | ICD-10-CM | POA: Diagnosis not present

## 2018-09-01 DIAGNOSIS — E785 Hyperlipidemia, unspecified: Secondary | ICD-10-CM

## 2018-09-01 DIAGNOSIS — R410 Disorientation, unspecified: Secondary | ICD-10-CM

## 2018-09-01 DIAGNOSIS — Z9181 History of falling: Secondary | ICD-10-CM

## 2018-09-01 DIAGNOSIS — R339 Retention of urine, unspecified: Secondary | ICD-10-CM

## 2018-09-01 DIAGNOSIS — Z8781 Personal history of (healed) traumatic fracture: Secondary | ICD-10-CM

## 2018-09-01 LAB — POCT URINALYSIS DIPSTICK
Bilirubin, UA: NEGATIVE
Glucose, UA: NEGATIVE
Ketones, UA: NEGATIVE
Nitrite, UA: NEGATIVE
Protein, UA: POSITIVE — AB
Spec Grav, UA: 1.015 (ref 1.010–1.025)
Urobilinogen, UA: 0.2 E.U./dL
pH, UA: 5 (ref 5.0–8.0)

## 2018-09-01 NOTE — Progress Notes (Addendum)
Name: Roy Hernandez   MRN: [id number]    DOB: April 13, 1928   Date:09/01/2018       Progress Note  Subjective  Chief Complaint  Chief Complaint  Patient presents with  . Urinary Retention    Has been going on for awhile-will have to turn on the faucet to go to the bathroom  . Weight Loss    Will eat 3 meals a day-has lost weight since last visit.  . Fall    August 21, 2018 fell at home and crack his rib  . Mouth Lesions    Right side of his gum  . Dementia    Is increasingly getting worst    HPI  Recent Fall: daughters Chief of Staff and also [employer] ) came in with him today. They are worried because of change in behavior over the past month. Initially forgetting their names and getting daughter's confused, weight loss - it had gone down to 154 lbs but now taking glucerna and whole milk and gained a few pounds since.This last fall happened January 21st at home when he got up during the night to void. [facility] was [facility] within 3 hours of the fall.  He states he did not loose consciousness.   Urinary retention: on CT, over the past year has urgency but inability to void, urinary frequency, hesitancy, no blood in urine, no fever or chills. Turning on the faucette helps  DMII: fsbs not being checked at home, not on medications, except for pravastatin  MGUS: going on for over one year, going back to see Dr. Janese Banks in Feb.   Gum: continue peroxide, healing   Dementia: taking Aricept, progressing, but we will check labs to make sure he is okay  Balance problems; he has PAD, using a cane, likely multifactorial   Patient Active Problem List   Diagnosis Date Noted  . Neuropathy 06/16/2018  . 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 Left bundle-branch block 02/13/2018  . History of right inguinal hernia repair 12/16/2017  . Epiretinal membrane (ERM) of right eye 10/11/2017  . DJD (degenerative joint disease) 10/04/2017  . Hyperglycemia 10/04/2017  . Advanced stage glaucoma 10/04/2017   . Hypertension, benign 07/06/2017  . PAD (peripheral artery disease) ([place]) 10/19/2016  . Osteoporosis of femur without pathological fracture 11/25/2015  . AAA (abdominal aortic aneurysm) without rupture ([place]) 11/05/2014  . Abnormal ECG 11/05/2014  . At risk for falling 11/05/2014  . DD (diverticular disease) 11/05/2014  . Dyslipidemia 11/05/2014  . Failure of erection 11/05/2014  . Degenerative arthritis of lumbar spine 11/05/2014  . Compressed spine fracture ([place]) 11/05/2014  . Basal cell papilloma 11/05/2014  . Vitamin D deficiency 11/05/2014    Past Surgical History:  Procedure Laterality Date  . CATARACT EXTRACTION Bilateral   . INGUINAL HERNIA REPAIR Right 01/27/2018   Medium Ultra Pro mesh.  Surgeon: Robert Bellow, MD;  Location: [facility] ORS;  Service: General;  Laterality: Right;  with mesh  . TOOTH EXTRACTION      Family History  Problem Relation Age of Onset  . Cancer Mother   . Hypertension Mother   . Diabetes Father   . Hyperlipidemia Father   . Diabetes Maternal Grandmother   . Diabetes Brother   . Kidney disease Brother        on dialysis  . Diabetes Sister   . Kidney disease Sister        dialysis  . Cancer Brother  esophageal  . Leukemia Sister     Social History   Socioeconomic History  . Marital status: Widowed    Spouse name: [name]  . Number of children: 3  . Years of education: Not on file  . Highest education level: 9th grade  Occupational History    Employer: [employer]  Social Needs  . Financial resource strain: Not hard at all  . Food insecurity:    Worry: Never true    Inability: Never true  . Transportation needs:    Medical: No    Non-medical: No  Tobacco Use  . Smoking status: Former Smoker    Packs/day: 1.00    Years: 34.00    Pack years: 34.00    Types: Cigarettes    Start date: 08/02/1945    Last attempt to quit: 05/26/1980    Years since quitting: 38.2  . Smokeless tobacco: Never Used  . Tobacco comment: smoking  cessation materials not required  Substance and Sexual Activity  . Alcohol use: No    Alcohol/week: 0.0 standard drinks  . Drug use: No  . Sexual activity: Not Currently  Lifestyle  . Physical activity:    Days per week: 3 days    Minutes per session: 20 min  . Stress: Not at all  Relationships  . Social connections:    Talks on phone: More than three times a week    Gets together: Twice a week    Attends religious service: More than 4 times per year    Active member of club or organization: No    Attends meetings of clubs or organizations: Never    Relationship status: Widowed  . Intimate partner violence:    Fear of current or ex partner: No    Emotionally abused: No    Physically abused: No    Forced sexual activity: No  Other Topics Concern  . Not on file  Social History Narrative  . Not on file     Current Outpatient Medications:  .  Acetaminophen (TYLENOL ARTHRITIS PAIN PO), Take 1 tablet by mouth 2 (two) times daily., Disp: , Rfl:  .  alendronate (FOSAMAX) 70 MG tablet, Take 1 tablet (70 mg total) by mouth once a week. Take with a full glass of water on an empty stomach. (Patient taking differently: Take 70 mg by mouth every Monday. Take with a full glass of water on an empty stomach.), Disp: 12 tablet, Rfl: 3 .  aspirin EC 81 MG tablet, Take 81 mg by mouth at bedtime., Disp: , Rfl:  .  Carboxymethylcellulose Sodium (LUBRICANT EYE DROPS PF OP), Place 1 drop into both eyes 3 (three) times daily as needed (for dry eyes.). , Disp: , Rfl:  .  cholecalciferol (VITAMIN D) 1000 UNITS tablet, Take 1,000 Units by mouth daily. , Disp: , Rfl:  .  donepezil (ARICEPT) 10 MG tablet, Take 1 tablet (10 mg total) by mouth at bedtime., Disp: 90 tablet, Rfl: 1 .  latanoprost (XALATAN) 0.005 % ophthalmic solution, Place 1 drop into both eyes at bedtime. , Disp: , Rfl:  .  Menthol-Methyl Salicylate (MUSCLE RUB EX), Apply 1 application topically 4 (four) times daily as needed (for pain.). ,  Disp: , Rfl:  .  simvastatin (ZOCOR) 5 MG tablet, Take 1 tablet (5 mg total) by mouth at bedtime., Disp: 90 tablet, Rfl: 1 .  timolol (TIMOPTIC) 0.5 % ophthalmic solution, Place 1 drop into both eyes daily. , Disp: , Rfl:  .  vitamin B-12 (  CYANOCOBALAMIN) 1000 MCG tablet, Take 1,000 mcg by mouth daily. , Disp: , Rfl:  .  fluticasone (FLONASE) 50 MCG/ACT nasal spray, Place 2 sprays into both nostrils daily. (Patient not taking: Reported on 02/24/2018), Disp: 48 g, Rfl: 1 .  ONE TOUCH ULTRA TEST test strip, TEST BLOOD GLUCOSE LEVEL ONCE A DAY. (Patient not taking: Reported on 02/24/2018), Disp: 90 each, Rfl: 1  No Known Allergies  I personally reviewed active problem list, medication list, allergies, family history, social history with the patient/caregiver today.   ROS  Constitutional: Negative for fever  Respiratory: Negative for cough and shortness of breath.   Cardiovascular: Negative for chest pain or palpitations.  Gastrointestinal: Negative for abdominal pain, he has occasionally stools are loose  Musculoskeletal: Negative for gait problem but no joint swelling.  Skin: Negative for rash.  Neurological: positive for dizziness intermittently when he gets up quickly, but no  headache.  No other specific complaints in a complete review of systems (except as listed in HPI above).  Objective  Vitals:   09/01/18 1342  BP: 118/74  Pulse: 89  Resp: 16  Temp: (!) 97.2 F (36.2 C)  TempSrc: Oral  SpO2: 95%  Weight: 157 lb (71.2 kg)  Height: 5\' 7"  (1.702 m)    Body mass index is 24.59 kg/m.  Physical Exam  Constitutional: Patient appears very thin. No distress.  HEENT: head atraumatic, normocephalic, pupils equal and reactive to light, neck supple, throat within normal limits Cardiovascular: Normal rate, regular rhythm and normal heart sounds.  No murmur heard. No BLE edema. Pulmonary/Chest: Effort normal and breath sounds normal. No respiratory distress. Abdominal: Soft.  There  is no tenderness. Psychiatric: Patient has a normal mood and affect. behavior is normal. Judgment and thought content normal Muscular Skeletal: atrophy of hands and arms, using a cane, walking slowly, lower ribs.  Recent Results (from the past 2160 hour(s))  HM DIABETES EYE EXAM     Status: None   Collection Time: 06/06/18 12:00 AM  Result Value Ref Range   HM Diabetic Eye Exam No Retinopathy No Retinopathy    Comment: [facility]- Dr. Wallace Going rtn in 6 mths glaucoma f/u  POCT HgB A1C     Status: Normal   Collection Time: 06/16/18 11:15 AM  Result Value Ref Range   Hemoglobin A1C     HbA1c POC (<> result, manual entry)     HbA1c, POC (prediabetic range)     HbA1c, POC (controlled diabetic range) 5.6 0.0 - 7.0 %  POCT urinalysis dipstick     Status: Abnormal   Collection Time: 09/01/18  1:57 PM  Result Value Ref Range   Color, UA yellow    Clarity, UA clear    Glucose, UA Negative Negative   Bilirubin, UA neg    Ketones, UA neg    Spec Grav, UA 1.015 1.010 - 1.025   Blood, UA trace    pH, UA 5.0 5.0 - 8.0   Protein, UA Positive (A) Negative    Comment: Trace   Urobilinogen, UA 0.2 0.2 or 1.0 E.U./dL   Nitrite, UA neg    Leukocytes, UA Trace (A) Negative   Appearance     Odor      PHQ2/9: Depression screen [facility] 2/9 06/16/2018 02/13/2018 10/04/2017 10/04/2017 09/08/2017  Decreased Interest 0 0 0 0 0  Down, Depressed, Hopeless 0 0 0 0 0  PHQ - 2 Score 0 0 0 0 0  Altered sleeping 1 - 0 - -  Tired,  decreased energy 1 - 0 - -  Change in appetite 0 - 0 - -  Feeling bad or failure about yourself  0 - 0 - -  Trouble concentrating 0 - 0 - -  Moving slowly or fidgety/restless 0 - 0 - -  Suicidal thoughts 0 - 0 - -  PHQ-9 Score 2 - 0 - -  Difficult doing work/chores Not difficult at all - Not difficult at all - -    Fall Risk: Fall Risk  09/01/2018 06/16/2018 02/13/2018 10/04/2017 09/08/2017  Falls in the past year? 1 0 No Yes No  Number falls in past yr: 1 - - 2 or more -  Injury  with Fall? 1 - - No -  Risk Factor Category  - - - High Fall Risk -  Risk for fall due to : Impaired balance/gait;History of fall(s) - - History of fall(s);Impaired balance/gait;Impaired vision -  Risk for fall due to: Comment - - - slow gait, weak at times when walking, drags feet; wears glasses -  Follow up - - - Falls evaluation completed;Education provided;Falls prevention discussed -      Functional Status Survey: Is the patient deaf or have difficulty hearing?: Yes Does the patient have difficulty seeing, even when wearing glasses/contacts?: Yes Does the patient have difficulty concentrating, remembering, or making decisions?: Yes Does the patient have difficulty walking or climbing stairs?: Yes Does the patient have difficulty dressing or bathing?: Yes Does the patient have difficulty doing errands alone such as visiting a doctor's office or shopping?: Yes    Assessment & Plan  1. Confusion  - POCT urinalysis dipstick - Ambulatory referral to [facility] - CBC with Differential/Platelet - COMPLETE METABOLIC PANEL WITH GFR - Thyroid Panel With TSH  2. Urinary hesitancy  - POCT urinalysis dipstick - Ambulatory referral to Urology - CULTURE, URINE COMPREHENSIVE  3. Dyslipidemia associated with type 2 diabetes mellitus ([place])  On statin therapy  4. PAD (peripheral artery disease) ([place])  Under the care of vascular surgeon  5. Senile purpura (HCC)  Stable   6. Urinary retention  - Ambulatory referral to Urology - CULTURE, URINE COMPREHENSIVE  7. History of compression fracture of spine  Chronic   8. History of recent fall  - Ambulatory referral to [place] With rib fracture on x-ray but negative on CT

## 2018-09-02 LAB — COMPLETE METABOLIC PANEL WITH GFR
AG RATIO: 1.3 (calc) (ref 1.0–2.5)
ALT: 9 U/L (ref 9–46)
AST: 14 U/L (ref 10–35)
Albumin: 3.7 g/dL (ref 3.6–5.1)
Alkaline phosphatase (APISO): 93 U/L (ref 40–115)
BUN: 17 mg/dL (ref 7–25)
CALCIUM: 9.7 mg/dL (ref 8.6–10.3)
CO2: 31 mmol/L (ref 20–32)
Chloride: 102 mmol/L (ref 98–110)
Creat: 0.99 mg/dL (ref 0.70–1.11)
GFR, Est African American: 78 mL/min/{1.73_m2} (ref >=60)
GFR, Est Non African American: 67 mL/min/{1.73_m2} (ref >=60)
Globulin: 2.8 g/dL (calc) (ref 1.9–3.7)
Glucose, Bld: 105 mg/dL — ABNORMAL HIGH (ref 65–99)
Potassium: 4.3 mmol/L (ref 3.5–5.3)
Sodium: 141 mmol/L (ref 135–146)
Total Bilirubin: 0.4 mg/dL (ref 0.2–1.2)
Total Protein: 6.5 g/dL (ref 6.1–8.1)

## 2018-09-02 LAB — URINE CULTURE
MICRO NUMBER:: 135489
SPECIMEN QUALITY:: ADEQUATE

## 2018-09-02 LAB — THYROID PANEL WITH TSH
FREE THYROXINE INDEX: 2.7 (ref 1.4–3.8)
T3 Uptake: 35 % (ref 22–35)
T4, Total: 7.6 ug/dL (ref 4.9–10.5)
TSH: 0.82 mIU/L (ref 0.40–4.50)

## 2018-09-02 LAB — CBC WITH DIFFERENTIAL/PLATELET
Absolute Monocytes: 462 cells/uL (ref 200–950)
Basophils Absolute: 40 cells/uL (ref 0–200)
Basophils Relative: 0.7 %
Eosinophils Absolute: 143 cells/uL (ref 15–500)
Eosinophils Relative: 2.5 %
HCT: 37.8 % — ABNORMAL LOW (ref 38.5–50.0)
Hemoglobin: 13.2 g/dL (ref 13.2–17.1)
Lymphs Abs: 1117 cells/uL (ref 850–3900)
MCH: 32.8 pg (ref 27.0–33.0)
MCHC: 34.9 g/dL (ref 32.0–36.0)
MCV: 93.8 fL (ref 80.0–100.0)
MPV: 11 fL (ref 7.5–12.5)
Monocytes Relative: 8.1 %
NEUTROS PCT: 69.1 %
Neutro Abs: 3939 cells/uL (ref 1500–7800)
Platelets: 215 10*3/uL (ref 140–400)
RBC: 4.03 10*6/uL — ABNORMAL LOW (ref 4.20–5.80)
RDW: 11.8 % (ref 11.0–15.0)
Total Lymphocyte: 19.6 %
WBC: 5.7 10*3/uL (ref 3.8–10.8)

## 2018-09-08 ENCOUNTER — Telehealth: Payer: Self-pay | Admitting: Family Medicine

## 2018-09-08 ENCOUNTER — Inpatient Hospital Stay: Payer: Medicare HMO | Attending: Oncology

## 2018-09-08 DIAGNOSIS — D472 Monoclonal gammopathy: Secondary | ICD-10-CM | POA: Insufficient documentation

## 2018-09-08 LAB — COMPREHENSIVE METABOLIC PANEL
ALT: 11 U/L (ref 0–44)
AST: 17 U/L (ref 15–41)
Albumin: 3.8 g/dL (ref 3.5–5.0)
Alkaline Phosphatase: 113 U/L (ref 38–126)
Anion gap: 5 (ref 5–15)
BILIRUBIN TOTAL: 0.5 mg/dL (ref 0.3–1.2)
BUN: 21 mg/dL (ref 8–23)
CO2: 30 mmol/L (ref 22–32)
Calcium: 9.1 mg/dL (ref 8.9–10.3)
Chloride: 103 mmol/L (ref 98–111)
Creatinine, Ser: 0.93 mg/dL (ref 0.61–1.24)
GFR calc Af Amer: >60 mL/min (ref >60)
GFR calc non Af Amer: >60 mL/min (ref >60)
Glucose, Bld: 106 mg/dL — ABNORMAL HIGH (ref 70–99)
Potassium: 4.4 mmol/L (ref 3.5–5.1)
Sodium: 138 mmol/L (ref 135–145)
TOTAL PROTEIN: 7.1 g/dL (ref 6.5–8.1)

## 2018-09-08 LAB — CBC WITH DIFFERENTIAL/PLATELET
Abs Immature Granulocytes: 0.01 10*3/uL (ref 0.00–0.07)
Basophils Absolute: 0 10*3/uL (ref 0.0–0.1)
Basophils Relative: 1 %
EOS ABS: 0.1 10*3/uL (ref 0.0–0.5)
Eosinophils Relative: 2 %
HCT: 39.6 % (ref 39.0–52.0)
Hemoglobin: 13 g/dL (ref 13.0–17.0)
Immature Granulocytes: 0 %
Lymphocytes Relative: 19 %
Lymphs Abs: 1.1 10*3/uL (ref 0.7–4.0)
MCH: 31.6 pg (ref 26.0–34.0)
MCHC: 32.8 g/dL (ref 30.0–36.0)
MCV: 96.4 fL (ref 80.0–100.0)
Monocytes Absolute: 0.6 10*3/uL (ref 0.1–1.0)
Monocytes Relative: 10 %
Neutro Abs: 3.8 10*3/uL (ref 1.7–7.7)
Neutrophils Relative %: 68 %
Platelets: 196 10*3/uL (ref 150–400)
RBC: 4.11 MIL/uL — AB (ref 4.22–5.81)
RDW: 12.8 % (ref 11.5–15.5)
WBC: 5.6 10*3/uL (ref 4.0–10.5)
nRBC: 0 % (ref 0.0–0.2)

## 2018-09-08 NOTE — Telephone Encounter (Signed)
Copied from [place] [id number]. Topic: Quick Communication - See Telephone Encounter >> Sep 08, 2018  1:43 PM Rayann Heman wrote: CRM for notification. See Telephone encounter for: 09/08/18. Cindy calling from [place] at home called and stated that they will go out 09/10/18 for evaluation.

## 2018-09-11 ENCOUNTER — Telehealth: Payer: Self-pay | Admitting: Family Medicine

## 2018-09-11 LAB — KAPPA/LAMBDA LIGHT CHAINS
Kappa free light chain: 20.5 mg/L — ABNORMAL HIGH (ref 3.3–19.4)
Kappa, lambda light chain ratio: 0.29 (ref 0.26–1.65)
Lambda free light chains: 70.5 mg/L — ABNORMAL HIGH (ref 5.7–26.3)

## 2018-09-11 NOTE — Telephone Encounter (Signed)
Copied from [place] [phone]. Topic: Quick Communication - Home Health Verbal Orders >> Sep 11, 2018 10:20 AM Bea Graff, NT wrote: Caller/AgencyJeannene Patella [facility] Number: [phone] ask for [facility] Requesting OT/PT/Skilled Nursing/Social Work: Skilled Nursing  Frequency: Education on med and syncope, 1 time a week for 1 week, 2 times a week for 3 weeks, and 2 PRN visits

## 2018-09-11 NOTE — Telephone Encounter (Signed)
Okay to give verbal order.

## 2018-09-12 LAB — MULTIPLE MYELOMA PANEL, SERUM
ALBUMIN SERPL ELPH-MCNC: 3.7 g/dL (ref 2.9–4.4)
Albumin/Glob SerPl: 1.3 (ref 0.7–1.7)
Alpha 1: 0.2 g/dL (ref 0.0–0.4)
Alpha2 Glob SerPl Elph-Mcnc: 0.7 g/dL (ref 0.4–1.0)
B-Globulin SerPl Elph-Mcnc: 1.5 g/dL — ABNORMAL HIGH (ref 0.7–1.3)
Gamma Glob SerPl Elph-Mcnc: 0.4 g/dL (ref 0.4–1.8)
Globulin, Total: 2.9 g/dL (ref 2.2–3.9)
IgA: 1457 mg/dL — ABNORMAL HIGH (ref 61–437)
IgG (Immunoglobin G), Serum: 663 mg/dL — ABNORMAL LOW (ref 700–1600)
IgM (Immunoglobulin M), Srm: 11 mg/dL — ABNORMAL LOW (ref 15–143)
M Protein SerPl Elph-Mcnc: 0.8 g/dL — ABNORMAL HIGH
Total Protein ELP: 6.6 g/dL (ref 6.0–8.5)

## 2018-09-13 ENCOUNTER — Encounter: Payer: Self-pay | Admitting: Family Medicine

## 2018-09-18 ENCOUNTER — Telehealth: Payer: Self-pay | Admitting: Family Medicine

## 2018-09-18 NOTE — Telephone Encounter (Signed)
Okay to give verbal order.

## 2018-09-18 NOTE — Telephone Encounter (Signed)
Copied from [place]. Topic: Quick Communication - Home Health Verbal Orders >> Sep 18, 2018  2:11 PM Adelene Idler wrote: Caller/Agency: Wes/[facility] Number: [phone] Requesting OT/PT/Skilled Nursing/Social Work: PT Frequency: 2 week 3 , 1 week 3

## 2018-09-18 NOTE — Telephone Encounter (Signed)
Spoke with [facility] and gave him verbal ok per Dr. Ancil Boozer for patient to have PT.

## 2018-10-02 ENCOUNTER — Ambulatory Visit: Payer: Medicare HMO | Admitting: Urology

## 2018-10-02 ENCOUNTER — Encounter: Payer: Self-pay | Admitting: Urology

## 2018-10-02 VITALS — BP 98/60 | HR 74 | Ht 67.0 in | Wt 157.0 lb

## 2018-10-02 DIAGNOSIS — R3911 Hesitancy of micturition: Secondary | ICD-10-CM

## 2018-10-02 DIAGNOSIS — R350 Frequency of micturition: Secondary | ICD-10-CM

## 2018-10-02 LAB — URINALYSIS, COMPLETE
Bilirubin, UA: NEGATIVE
Glucose, UA: NEGATIVE
Ketones, UA: NEGATIVE
NITRITE UA: NEGATIVE
Protein, UA: NEGATIVE
RBC, UA: NEGATIVE
Specific Gravity, UA: 1.025 (ref 1.005–1.030)
Urobilinogen, Ur: 0.2 mg/dL (ref 0.2–1.0)
pH, UA: 6.5 (ref 5.0–7.5)

## 2018-10-02 LAB — MICROSCOPIC EXAMINATION
Bacteria, UA: NONE SEEN
Epithelial Cells (non renal): NONE SEEN /hpf (ref 0–10)
RBC, UA: NONE SEEN /hpf (ref 0–2)

## 2018-10-02 LAB — BLADDER SCAN AMB NON-IMAGING

## 2018-10-02 MED ORDER — TAMSULOSIN HCL 0.4 MG PO CAPS: 0.4000 mg | ORAL_CAPSULE | Freq: Every day | ORAL | 3 refills | Status: DC

## 2018-10-02 NOTE — Patient Instructions (Signed)
Benign Prostatic Hyperplasia ° °Benign prostatic hyperplasia (BPH) is an enlarged prostate gland that is caused by the normal aging process and not by cancer. The prostate is a walnut-sized gland that is involved in the production of semen. It is located in front of the rectum and below the bladder. The bladder stores urine and the urethra is the tube that carries the urine out of the body. The prostate may get bigger as a man gets older. °An enlarged prostate can press on the urethra. This can make it harder to pass urine. The build-up of urine in the bladder can cause infection. Back pressure and infection may progress to bladder damage and kidney (renal) failure. °What are the causes? °This condition is part of a normal aging process. However, not all men develop problems from this condition. If the prostate enlarges away from the urethra, urine flow will not be blocked. If it enlarges toward the urethra and compresses it, there will be problems passing urine. °What increases the risk? °This condition is more likely to develop in men over the age of 50 years. °What are the signs or symptoms? °Symptoms of this condition include: °· Getting up often during the night to urinate. °· Needing to urinate frequently during the day. °· Difficulty starting urine flow. °· Decrease in size and strength of your urine stream. °· Leaking (dribbling) after urinating. °· Inability to pass urine. This needs immediate treatment. °· Inability to completely empty your bladder. °· Pain when you pass urine. This is more common if there is also an infection. °· Urinary tract infection (UTI). °How is this diagnosed? °This condition is diagnosed based on your medical history, a physical exam, and your symptoms. Tests will also be done, such as: °· A post-void bladder scan. This measures any amount of urine that may remain in your bladder after you finish urinating. °· A digital rectal exam. In a rectal exam, your health care provider  checks your prostate by putting a lubricated, gloved finger into your rectum to feel the back of your prostate gland. This exam detects the size of your gland and any abnormal lumps or growths. °· An exam of your urine (urinalysis). °· A prostate specific antigen (PSA) screening. This is a blood test used to screen for prostate cancer. °· An ultrasound. This test uses sound waves to electronically produce a picture of your prostate gland. °Your health care provider may refer you to a specialist in kidney and prostate diseases (urologist). °How is this treated? °Once symptoms begin, your health care provider will monitor your condition (active surveillance or watchful waiting). Treatment for this condition will depend on the severity of your condition. Treatment may include: °· Observation and yearly exams. This may be the only treatment needed if your condition and symptoms are mild. °· Medicines to relieve your symptoms, including: °? Medicines to shrink the prostate. °? Medicines to relax the muscle of the prostate. °· Surgery in severe cases. Surgery may include: °? Prostatectomy. In this procedure, the prostate tissue is removed completely through an open incision or with a laparascope or robotics. °? Transurethral resection of the prostate (TURP). In this procedure, a tool is inserted through the opening at the tip of the penis (urethra). It is used to cut away tissue of the inner core of the prostate. The pieces are removed through the same opening of the penis. This removes the blockage. °? Transurethral incision (TUIP). In this procedure, small cuts are made in the prostate. This lessens   the prostate's pressure on the urethra. ? Transurethral microwave thermotherapy (TUMT). This procedure uses microwaves to create heat. The heat destroys and removes a small amount of prostate tissue. ? Transurethral needle ablation (TUNA). This procedure uses radio frequencies to destroy and remove a small amount of  prostate tissue. ? Interstitial laser coagulation ([place]). This procedure uses a laser to destroy and remove a small amount of prostate tissue. ? Transurethral electrovaporization (TUVP). This procedure uses electrodes to destroy and remove a small amount of prostate tissue. ? Prostatic urethral lift. This procedure inserts an implant to push the lobes of the prostate away from the urethra. Follow these instructions at home:  Take over-the-counter and prescription medicines only as told by your health care provider.  Monitor your symptoms for any changes. Contact your health care provider with any changes.  Avoid drinking large amounts of liquid before going to bed or out in public.  Avoid or reduce how much caffeine or alcohol you drink.  Give yourself time when you urinate.  Keep all follow-up visits as told by your health care provider. This is important. Contact a health care provider if:  You have unexplained back pain.  Your symptoms do not get better with treatment.  You develop side effects from the medicine you are taking.  Your urine becomes very dark or has a bad smell.  Your lower abdomen becomes distended and you have trouble passing your urine. Get help right away if:  You have a fever or chills.  You suddenly cannot urinate.  You feel lightheaded, or very dizzy, or you faint.  There are large amounts of blood or clots in the urine.  Your urinary problems become hard to manage.  You develop moderate to severe low back or flank pain. The flank is the side of your body between the ribs and the hip. These symptoms may represent a serious problem that is an emergency. Do not wait to see if the symptoms will go away. Get medical help right away. Call your local emergency services (911 in the [place] Do not drive yourself to the hospital. Summary  Benign prostatic hyperplasia (BPH) is an enlarged prostate that is caused by the normal aging process and not by  cancer.  An enlarged prostate can press on the urethra. This can make it hard to pass urine.  This condition is part of a normal aging process and is more likely to develop in men over the age of 61 years.  Get help right away if you suddenly cannot urinate. This information is not intended to replace advice given to you by your health care provider. Make sure you discuss any questions you have with your health care provider. Document Released: 07/19/2005 Document Revised: 08/23/2016 Document Reviewed: 08/23/2016 Elsevier Interactive Patient Education  2019 [place] What is urodynamic testing?  Urodynamic tests are done to determine how well your lower urinary tract is working. The lower urinary tract includes your bladder and the part of your body that drains urine from the bladder (urethra). When your kidneys filter your blood, urine is stored in your bladder until you feel the urge to urinate. Urination requires coordination between the nerves and muscles of your bladder and urethra. When your lower urinary tract is working well, you should be able to:  Start urinating when your bladder is full.  Empty your bladder completely.  Control the flow of your urine. Why do I need urodynamic testing? You may need urodynamic testing to help  find the cause of any of these problems:  Leaking urine (incontinence).  Problems starting or stopping your urine flow.  Frequent or painful urination.  Frequent urinary tract infections.  Being unable to empty your bladder completely.  Having strong urges to pass urine (urgency).  Having a weak flow of urine. How do I prepare for the tests?  Ask your health care provider about changing or stopping your regular medicines. This is especially important if you are taking diabetes medicines or blood thinners.  You may be asked to avoid urinating before coming to the test so that you arrive with a full bladder.  Tell a  health care provider about: ? Any allergies you have. ? All medicines you are taking, including vitamins, herbs, eye drops, creams, and over-the-counter medicines. ? Whether you are pregnant or may be pregnant. What are the risks of this testing? Generally, these tests are safe. However, some of the tests have risks, including:  Discomfort.  Frequent urge to urinate.  Bleeding.  Infection.  Allergic reactions to medicines or dyes (contrast material). How is urodynamic testing done? You may have various urodynamic tests. The tests may be done separately or may all be done during one testing visit. You may be given an antibiotic medicine before or after testing to help prevent infection. The types of tests that may be done include: Uroflowmetry This test measures how much urine you pass and how long it takes to pass.  You will urinate into a certain type of toilet or device (flowmeter).  The device will measure the volume and the time of your urine flow.  These measurements will be sent to a computer that creates a graph of your urine flow. Postvoid residual measurement This test measures how much urine is left in your bladder after you urinate.  The test may be done with ultrasound. In this method, sound waves and a computer will be used to create an image of your bladder.  The test can also be done by inserting a thin, flexible tube (catheter) into your bladder after you urinate. The remaining urine will be removed through the catheter so it can be measured.  Remaining urine will be measured in milliliters (mL). If you have more than 100 mL left in your bladder after you urinate, your bladder is not emptying as it should. Cystometric testing This test uses a type of bladder catheter that can measure pressure.  You may be given a medicine to numb the area (local anesthetic).  The area around the opening of your urethra will be cleaned.  A urinary catheter will be passed  through your urethra into your bladder and used to empty your bladder completely.  Then a measuring catheter will be placed, and your bladder will be filled with warm, germ-free (sterile) water.  Pressure measurements will be taken: ? As your bladder fills. ? When you feel the need to urinate. ? As your bladder is emptied.  You may be asked to cough or bear down to check for leakage.  In some cases, your bladder may be filled with a material that shows up on X-rays (contrast material) so that X-ray pictures can be taken during the test. Electromyogram This test measures the electrical activity of the nerves and muscles of your bladder and the opening of your urethra.  Sticky patches (electrodes) will be placed near your rectum and urethra to measure electrical activity.  The measurements will show how well your nerves are communicating with your muscles.  What happens after the testing?  You should be able to go home right away and do your usual activities.  You may be told to drink a glass of water every 30 minutes for the first 2 hours after testing.  Taking a warm bath or using warm, wet cloths (warm compresses) may relieve any discomfort near your urethra.  Contact your health care provider if you have: ? Pain. ? Blood in your urine. ? Chills. ? Fever. What do the results mean? Talk with your health care provider about what your results mean. Some common causes for abnormal results from urodynamic tests include:  Enlarged prostate in men.  Overactive bladder.  Urinary tract infection.  Nervous system diseases.  Spinal cord damage. Questions to ask your health care provider Ask your health care provider, or the department that is doing the test:  When will my results be ready?  How will I get my results?  What are my treatment options?  What other tests do I need?  What are my next steps? Summary  Urodynamic tests are done to determine how well your lower  urinary tract is working. The lower urinary tract includes your bladder and urethra.  You may need urodynamic testing to help find the cause of various problems with urination, such as leaking urine (incontinence) or problems starting or stopping your urine flow.  You may have various urodynamic tests. The tests may be done separately or may all be done during one testing visit.  Talk with your health care provider about what your results mean.  Contact your health care provider if you have pain, chills, a fever, or blood in your urine. This information is not intended to replace advice given to you by your health care provider. Make sure you discuss any questions you have with your health care provider. Document Released: 05/16/2007 Document Revised: 05/23/2017 Document Reviewed: 05/23/2017 Elsevier Interactive Patient Education  2019 [employer].

## 2018-10-02 NOTE — Progress Notes (Signed)
10/02/2018 12:39 PM   [name] 10/31/1927 [id number]  Referring provider: [name], MD [address] [place], [place] [address]  CC: Urinary hesitancy, weak stream, incontinence  HPI: I saw Mr. Roy Hernandez in urology clinic in consultation for BPH with urinary symptoms from Dr. Ancil Boozer.  He is an 85 year old relatively healthy male with some memory problems who is here today with his daughter to discuss his urinary symptoms.  He reports a multiyear history of weakening urinary stream with difficulty emptying his bladder, feeling of incomplete emptying, urinary frequency, urgency, and occasional urge incontinence, and nocturia 4-5 times per night.  He denies any history of urinary retention.  He denies gross hematuria or flank pain.  He has never tried any medications for this.  There are no aggravating or alleviating factors.  Severity is moderate to severe.   PVR in clinic today is 354 cc.   PMH: Past Medical History:  Diagnosis Date  . AAA (abdominal aortic aneurysm) ([place])   . Dementia ([place])   . Diabetes mellitus without complication (HCC)    diet controlled  . Glaucoma   . Hyperkalemia   . Hyperlipidemia   . Hypertension   . Inguinal hernia of right side without obstruction or gangrene   . Osteoporosis   . Peripheral vascular disease ([place])   . Vitamin D deficiency     Surgical History: Past Surgical History:  Procedure Laterality Date  . CATARACT EXTRACTION Bilateral   . INGUINAL HERNIA REPAIR Right 01/27/2018   Medium Ultra Pro mesh.  Surgeon: Robert Bellow, MD;  Location: [facility] ORS;  Service: General;  Laterality: Right;  with mesh  . TOOTH EXTRACTION      Allergies: No Known Allergies  Family History: Family History  Problem Relation Age of Onset  . Cancer Mother   . Hypertension Mother   . Diabetes Father   . Hyperlipidemia Father   . Diabetes Maternal Grandmother   . Diabetes Brother   . Kidney disease Brother        on dialysis  .  Diabetes Sister   . Kidney disease Sister        dialysis  . Cancer Brother        esophageal  . Leukemia Sister     Social History:  reports that he quit smoking about 38 years ago. His smoking use included cigarettes. He started smoking about 73 years ago. He has a 34.00 pack-year smoking history. He has never used smokeless tobacco. He reports that he does not drink alcohol or use drugs.  ROS: Please see flowsheet from today's date for complete review of systems.  Physical Exam: BP 98/60 (BP Location: Left Arm, Patient Position: Sitting, Cuff Size: Normal)   Pulse 74   Ht '5\' 7"'  (1.702 m)   Wt 157 lb (71.2 kg)   BMI 24.59 kg/m    Constitutional:  Alert and oriented, No acute distress. Cardiovascular: No clubbing, cyanosis, or edema. Respiratory: Normal respiratory effort, no increased work of breathing. GI: Abdomen is soft, nontender, nondistended, no abdominal masses GU: No CVA tenderness, phallus without lesions, widely patent meatus DRE: 50 g, smooth, no nodules or masses Lymph: No cervical or inguinal lymphadenopathy. Skin: No rashes, bruises or suspicious lesions. Neurologic: Grossly intact, no focal deficits, moving all 4 extremities. Psychiatric: Normal mood and affect.  Laboratory Data: Creatinine 0.93, EGFR greater than 60  Pertinent Imaging: None to review  Assessment & Plan:   In summary, the patient is an 85 year old  male with some memory problems and severe urinary symptoms likely secondary to bladder outlet obstruction.  We discussed options moving forward including medical management versus surgical intervention.  I worry with his elevated PVR that he will end up in retention at some point, and this is severely impacting his quality of life.  Even with his advanced age, I feel like he may benefit from an outlet procedure if urodynamics demonstrate outlet obstruction.  -Urodynamics in [place], follow-up to discuss results -Trial of Flomax for urinary  symptoms -If demonstrated bladder outlet obstruction, will obtain transrectal ultrasound at that visit to evaluate prostate volume and further discuss HOLEP versus TURP  Billey Co, MD  [facility] [address], [place], [place] [address] [phone]

## 2018-10-06 IMAGING — CR DG BONE SURVEY MET
1 series · 10 of 10 positions shown · non-contrast
Comparison: Left hip radiographs 08/19/2015. MRI of the lumbar
spine 04/04/2014..

CLINICAL DATA: Diabetes.  Hypertension.  Proteinuria.

EXAM:
METASTATIC BONE SURVEY

[Series 1: dg bone survey met · 0.14mm/px · 10 of 22 slices shown]
[im 1/22]
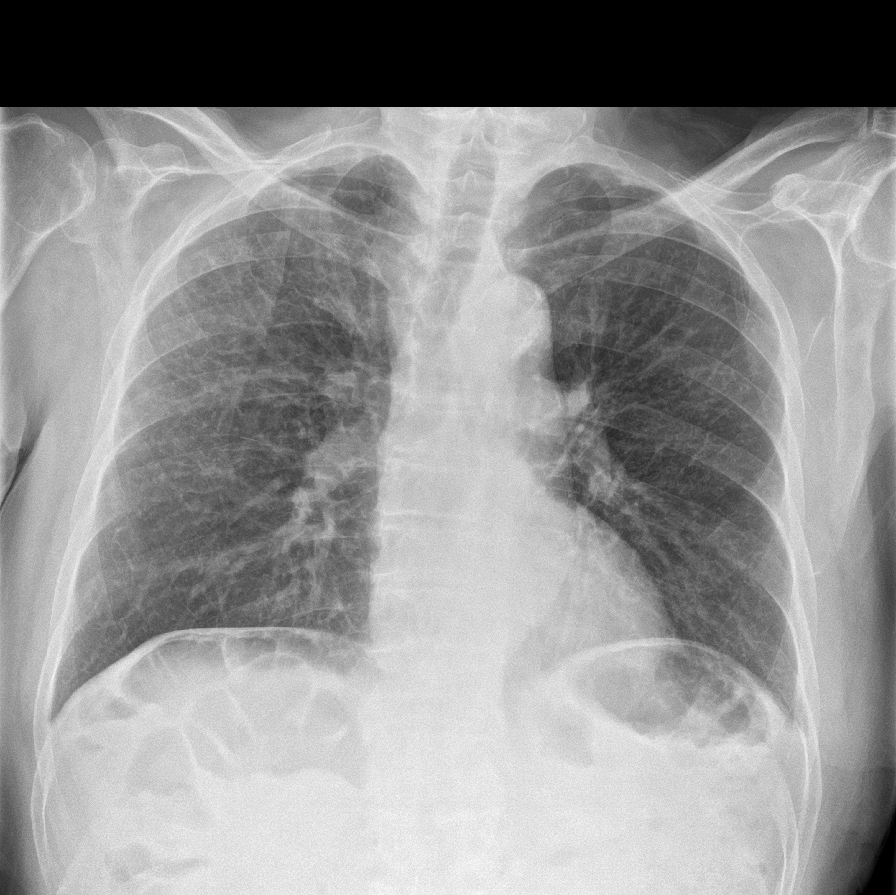
[im 3/22]
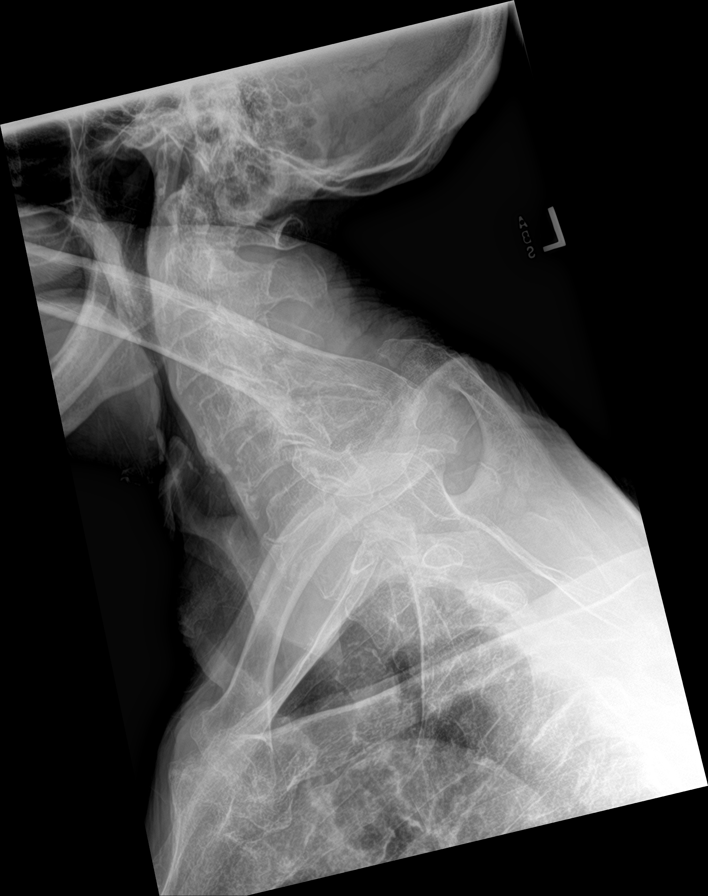
[im 5/22]
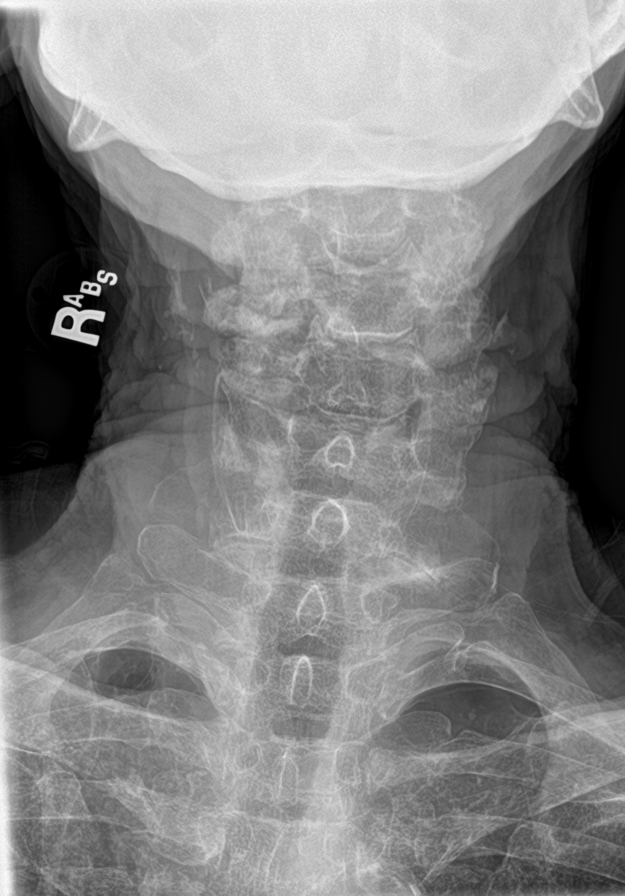
[im 8/22]
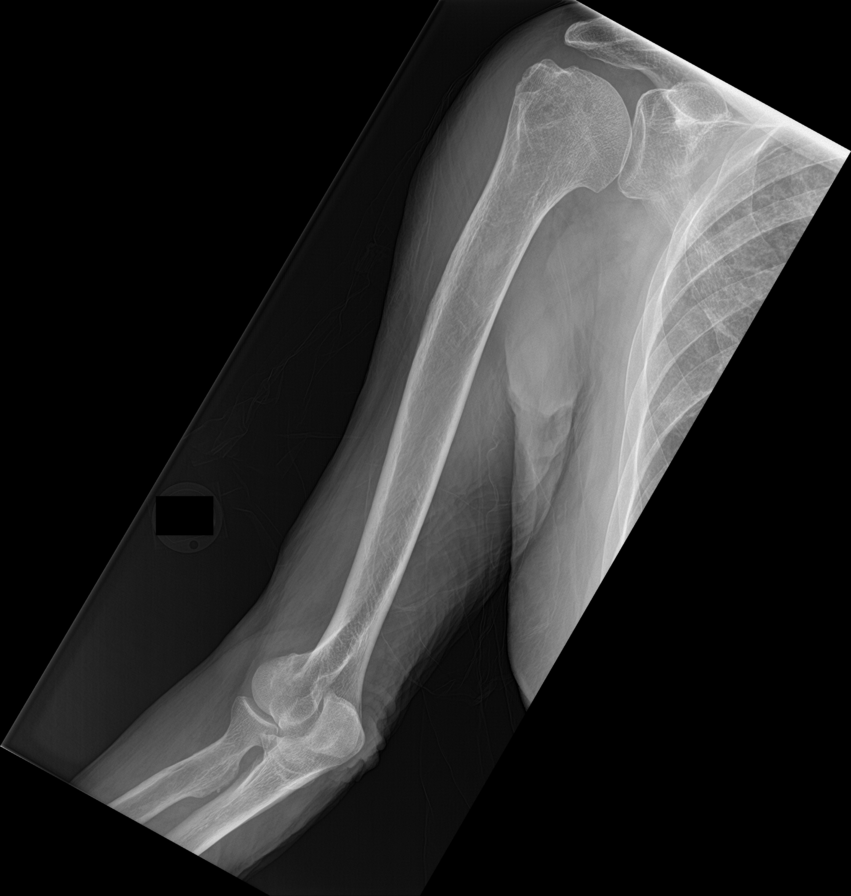
[im 10/22]
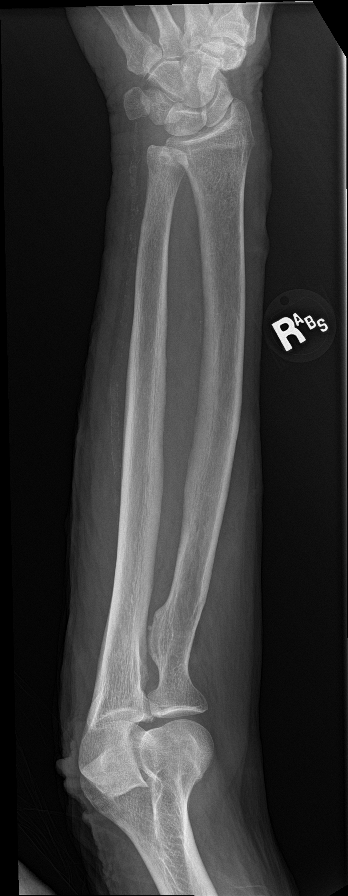
[im 12/22]
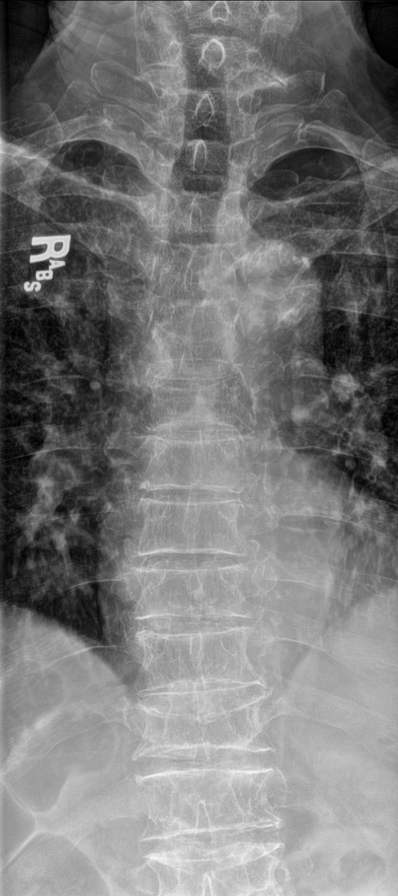
[im 15/22]
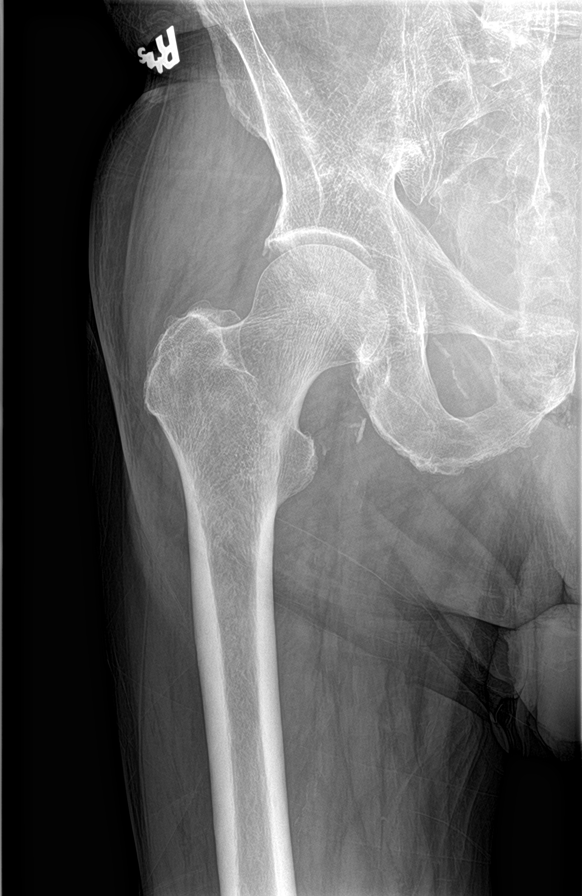
[im 17/22]
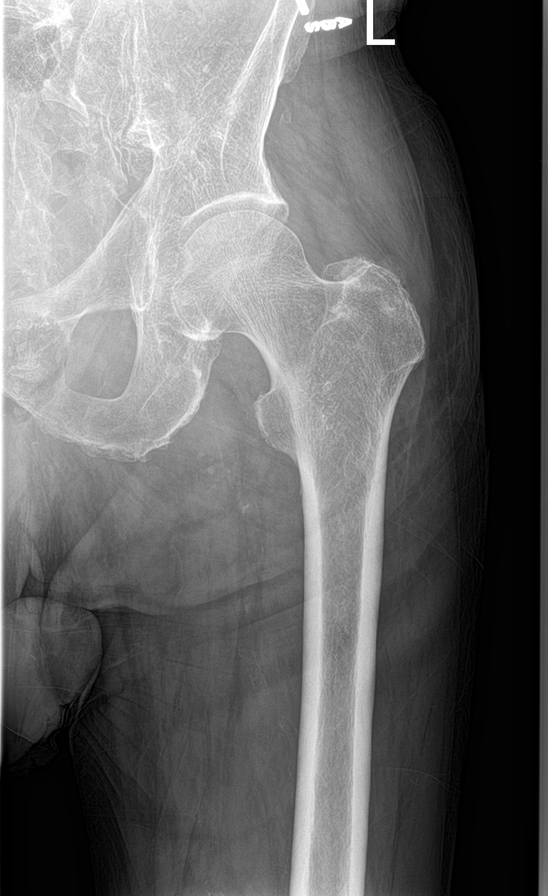
[im 19/22]
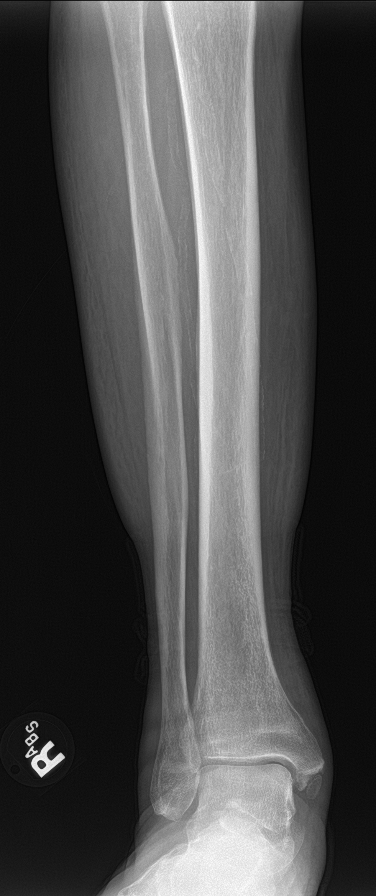
[im 22/22]
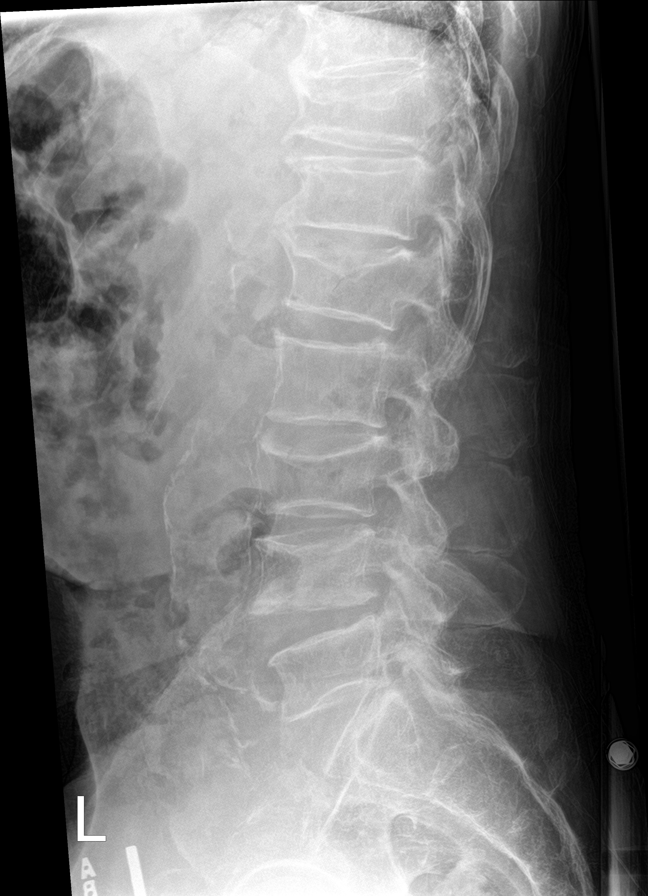

[10 of 10 positions shown; findings below may reference images not displayed]

FINDINGS: No focal lytic or sclerotic lesions are present within the imaged
skeleton. Multilevel degenerative changes are noted in cervical,
thoracic, and lumbar spine. Joints are located without significant
degenerative joint disease. A remote medial malleolar fracture is
present on the right. Remote mid and lower thoracic compression
fractures are present. Remote endplate fractures are present in the
lumbar spine at L1, L3, and L4.

Atherosclerotic calcifications are present in the aorta. Aorta
measures up to 4.2 cm. Atherosclerotic calcifications are also
present in the carotid arteries.
IMPRESSION: 1. No evidence for metastatic disease to the skeleton.
2. Compression fractures of the thoracic and lumbar spine are
remote.
3. Aortic Atherosclerosis ([id number]). Recommend followup by
ultrasound in 1 year. This recommendation follows ACR consensus
guidelines: White Paper of the ACR Incidental Findings Committee II
4. Carotid artery disease.

## 2018-10-10 ENCOUNTER — Ambulatory Visit (INDEPENDENT_AMBULATORY_CARE_PROVIDER_SITE_OTHER): Payer: Medicare HMO

## 2018-10-10 ENCOUNTER — Ambulatory Visit (INDEPENDENT_AMBULATORY_CARE_PROVIDER_SITE_OTHER): Payer: Medicare HMO | Admitting: Family Medicine

## 2018-10-10 ENCOUNTER — Encounter: Payer: Self-pay | Admitting: Family Medicine

## 2018-10-10 VITALS — BP 92/50 | HR 67 | Temp 98.1°F | Resp 15 | Ht 67.0 in | Wt 159.5 lb

## 2018-10-10 VITALS — BP 92/50 | HR 67 | Temp 98.1°F | Resp 15 | Ht 67.0 in | Wt 159.0 lb

## 2018-10-10 DIAGNOSIS — Z0000 Encounter for general adult medical examination without abnormal findings: Secondary | ICD-10-CM

## 2018-10-10 DIAGNOSIS — M810 Age-related osteoporosis without current pathological fracture: Secondary | ICD-10-CM | POA: Diagnosis not present

## 2018-10-10 DIAGNOSIS — E1169 Type 2 diabetes mellitus with other specified complication: Secondary | ICD-10-CM

## 2018-10-10 DIAGNOSIS — D692 Other nonthrombocytopenic purpura: Secondary | ICD-10-CM

## 2018-10-10 DIAGNOSIS — I739 Peripheral vascular disease, unspecified: Secondary | ICD-10-CM

## 2018-10-10 DIAGNOSIS — R031 Nonspecific low blood-pressure reading: Secondary | ICD-10-CM

## 2018-10-10 DIAGNOSIS — R4189 Other symptoms and signs involving cognitive functions and awareness: Secondary | ICD-10-CM | POA: Diagnosis not present

## 2018-10-10 DIAGNOSIS — E785 Hyperlipidemia, unspecified: Secondary | ICD-10-CM

## 2018-10-10 LAB — POCT GLYCOSYLATED HEMOGLOBIN (HGB A1C): HbA1c, POC (controlled diabetic range): 5.3 % (ref 0.0–7.0)

## 2018-10-10 MED ORDER — DONEPEZIL HCL 10 MG PO TABS: 10.0000 mg | ORAL_TABLET | Freq: Every day | ORAL | 1 refills | Status: DC

## 2018-10-10 MED ORDER — SIMVASTATIN 5 MG PO TABS: 5.0000 mg | ORAL_TABLET | Freq: Every day | ORAL | 1 refills | Status: DC

## 2018-10-10 MED ORDER — ALENDRONATE SODIUM 70 MG PO TABS: 70.0000 mg | ORAL_TABLET | ORAL | 3 refills | Status: DC

## 2018-10-10 NOTE — Progress Notes (Signed)
Subjective:   Roy Hernandez is a 85 y.o. male who presents for Medicare Annual/Subsequent preventive examination.  Review of Systems:   Cardiac Risk Factors include: advanced age (>98men, >85 women);diabetes mellitus;dyslipidemia;male gender     Objective:    Vitals: BP (!) 92/50 (BP Location: Left Arm, Patient Position: Sitting, Cuff Size: Normal)   Pulse 67   Temp 98.1 F (36.7 C) (Oral)   Resp 15   Ht 5\' 7"  (1.702 m)   Wt 159 lb 8 oz (72.3 kg)   SpO2 95%   BMI 24.98 kg/m   Body mass index is 24.98 kg/m.  Advanced Directives 10/10/2018 08/22/2018 06/09/2018 03/10/2018 02/24/2018 01/27/2018 01/20/2018  Does Patient Have a Medical Advance Directive? Yes Yes No No Yes Yes Yes  Type of Paramedic of [place];Living will - - Special educational needs teacher of [place];Living will [place];Living will [place];Living will  Does patient want to make changes to medical advance directive? - - - No - Patient declined No - Patient declined No - Patient declined No - Patient declined  Copy of [place] in Chart? Yes - validated most recent copy scanned in chart (See row information) - - Yes Yes No - copy requested Yes  Would patient like information on creating a medical advance directive? - - No - Patient declined No - Patient declined - - -    Tobacco Social History   Tobacco Use  Smoking Status Former Smoker  . Packs/day: 1.00  . Years: 34.00  . Pack years: 34.00  . Types: Cigarettes  . Start date: 08/02/1945  . Last attempt to quit: 05/26/1980  . Years since quitting: 38.4  Smokeless Tobacco Never Used  Tobacco Comment   smoking cessation materials not required     Counseling given: Not Answered Comment: smoking cessation materials not required   Clinical Intake:  Pre-visit preparation completed: Yes  Pain : No/denies pain     BMI - recorded: 24.98 Nutritional Status: BMI of  19-24  Normal Nutritional Risks: None Diabetes: Yes CBG done?: No Did pt. bring in CBG monitor from home?: No   Nutrition Risk Assessment:  Has the patient had any N/V/D within the last 2 months?  No  Does the patient have any non-healing wounds?  No  Has the patient had any unintentional weight loss or weight gain?  No   Diabetes:  Is the patient diabetic?  Yes  If diabetic, was a CBG obtained today?  No  Did the patient bring in their glucometer from home?  No  How often do you monitor your CBG's? Pt does not check blood sugar at home.   Financial Strains and Diabetes Management:  Are you having any financial strains with the device, your supplies or your medication? No .  Does the patient want to be seen by Chronic Care Management for management of their diabetes?  No  Would the patient like to be referred to a Nutritionist or for Diabetic Management?  No   Diabetic Exams:  Diabetic Eye Exam: Completed 06/06/18 negative retinopathy.   Diabetic Foot Exam: Completed 10/04/17. Pt has been advised about the importance in completing this exam. Pt is scheduled for diabetic foot exam today.   How often do you need to have someone help you when you read instructions, pamphlets, or other written materials from your doctor or pharmacy?: 1 - Never What is the last grade level you completed in  school?: 9th grade  Interpreter Needed?: No  Information entered by :: Clemetine Marker LPN  Past Medical History:  Diagnosis Date  . AAA (abdominal aortic aneurysm) ([place])   . Arthritis   . Cataract   . Dementia ([place])   . Diabetes mellitus without complication (HCC)    diet controlled  . Glaucoma   . Hyperkalemia   . Hyperlipidemia   . Hypertension   . Inguinal hernia of right side without obstruction or gangrene   . Osteoporosis   . Peripheral vascular disease ([place])   . Vitamin D deficiency    Past Surgical History:  Procedure Laterality Date  . CATARACT EXTRACTION Bilateral   .  HERNIA REPAIR  June 2019  . INGUINAL HERNIA REPAIR Right 01/27/2018   Medium Ultra Pro mesh.  Surgeon: Robert Bellow, MD;  Location: [facility] ORS;  Service: General;  Laterality: Right;  with mesh  . TOOTH EXTRACTION     Family History  Problem Relation Age of Onset  . Cancer Mother   . Hypertension Mother   . Diabetes Father   . Hyperlipidemia Father   . Diabetes Maternal Grandmother   . Diabetes Brother   . Kidney disease Brother        on dialysis  . Diabetes Sister   . Kidney disease Sister        dialysis  . Cancer Brother        esophageal  . Leukemia Sister    Social History   Socioeconomic History  . Marital status: Widowed    Spouse name: [name]  . Number of children: 3  . Years of education: Not on file  . Highest education level: 9th grade  Occupational History    Employer: RETIRED [employer]  Social Needs  . Financial resource strain: Not hard at all  . Food insecurity:    Worry: Never true    Inability: Never true  . Transportation needs:    Medical: No    Non-medical: No  Tobacco Use  . Smoking status: Former Smoker    Packs/day: 1.00    Years: 34.00    Pack years: 34.00    Types: Cigarettes    Start date: 08/02/1945    Last attempt to quit: 05/26/1980    Years since quitting: 38.4  . Smokeless tobacco: Never Used  . Tobacco comment: smoking cessation materials not required  Substance and Sexual Activity  . Alcohol use: No    Alcohol/week: 0.0 standard drinks  . Drug use: No  . Sexual activity: Not Currently  Lifestyle  . Physical activity:    Days per week: 3 days    Minutes per session: 20 min  . Stress: Not at all  Relationships  . Social connections:    Talks on phone: More than three times a week    Gets together: Twice a week    Attends religious service: More than 4 times per year    Active member of club or organization: No    Attends meetings of clubs or organizations: Never    Relationship status: Widowed  Other Topics Concern  . Not on  file  Social History Narrative  . Not on file    Outpatient Encounter Medications as of 10/10/2018  Medication Sig  . acetaminophen (TYLENOL) 500 MG tablet Take 500 mg by mouth at bedtime.  [name] alendronate (FOSAMAX) 70 MG tablet Take 1 tablet (70 mg total) by mouth once a week. Take with a full glass of water on an  empty stomach. (Patient taking differently: Take 70 mg by mouth every Monday. Take with a full glass of water on an empty stomach.)  . aspirin EC 81 MG tablet Take 81 mg by mouth at bedtime.  . Carboxymethylcellulose Sodium (LUBRICANT EYE DROPS PF OP) Place 1 drop into both eyes 3 (three) times daily as needed (for dry eyes.).   [name] cholecalciferol (VITAMIN D) 1000 UNITS tablet Take 1,000 Units by mouth daily.   [name] donepezil (ARICEPT) 10 MG tablet Take 1 tablet (10 mg total) by mouth at bedtime.  . fluticasone (FLONASE) 50 MCG/ACT nasal spray Place 2 sprays into both nostrils daily.  [name] latanoprost (XALATAN) 0.005 % ophthalmic solution Place 1 drop into both eyes at bedtime.   . Menthol-Methyl Salicylate (MUSCLE RUB EX) Apply 1 application topically 4 (four) times daily as needed (for pain.).   [name] simvastatin (ZOCOR) 5 MG tablet Take 1 tablet (5 mg total) by mouth at bedtime.  . tamsulosin (FLOMAX) 0.4 MG CAPS capsule Take 1 capsule (0.4 mg total) by mouth daily.  . timolol (TIMOPTIC) 0.5 % ophthalmic solution Place 1 drop into both eyes daily.   . vitamin B-12 (CYANOCOBALAMIN) 1000 MCG tablet Take 1,000 mcg by mouth daily.   . [DISCONTINUED] Acetaminophen (TYLENOL ARTHRITIS PAIN PO) Take 1 tablet by mouth 2 (two) times daily.  . ONE TOUCH ULTRA TEST test strip TEST BLOOD GLUCOSE LEVEL ONCE A DAY. (Patient not taking: Reported on 10/10/2018)   No facility-administered encounter medications on file as of 10/10/2018.     Activities of Daily Living In your present state of health, do you have any difficulty performing the following activities: 10/10/2018 09/01/2018  Hearing? Y Y  Comment  declines hearing aids -  Vision? N Y  Comment wears glasses -  Difficulty concentrating or making decisions? Y Y  Comment - -  Walking or climbing stairs? Y Y  Dressing or bathing? N Y  Comment - -  Doing errands, shopping? Tempie Donning  Preparing Food and eating ? N -  Using the Toilet? N -  In the past six months, have you accidently leaked urine? Y -  Do you have problems with loss of bowel control? N -  Managing your Medications? Y -  Managing your Finances? Y -  Housekeeping or managing your Housekeeping? Y -  Some recent data might be hidden    Patient Care Team: [name], MD as PCP - General (Family Medicine) Brendolyn Patty, MD as Consulting Physician (Dermatology) Delana Meyer, Dolores Lory, MD as Consulting Physician (Vascular Surgery)   Assessment:   This is a routine wellness examination for [place].  Exercise Activities and Dietary recommendations Current Exercise Habits: Home exercise routine, Type of exercise: calisthenics;stretching, Time (Minutes): 20, Frequency (Times/Week): 3, Weekly Exercise (Minutes/Week): 60, Intensity: Mild, Exercise limited by: orthopedic condition(s)  Goals    . DIET - INCREASE WATER INTAKE     Recommend to drink at least 6-8 8oz glasses of water per day.    . Increase water intake     Recommend increasing water intake to three 12-16oz bottles of water a day.       Fall Risk Fall Risk  10/10/2018 09/01/2018 06/16/2018 02/13/2018 10/04/2017  Falls in the past year? 1 1 0 No Yes  Number falls in past yr: 1 1 - - 2 or more  Injury with Fall? 1 1 - - No  Risk Factor Category  - - - - High Fall Risk  Risk for fall due to :  History of fall(s);Impaired balance/gait Impaired balance/gait;History of fall(s) - - History of fall(s);Impaired balance/gait;Impaired vision  Risk for fall due to: Comment - - - - slow gait, weak at times when walking, drags feet; wears glasses  Follow up Falls prevention discussed - - - Falls evaluation completed;Education  provided;Falls prevention discussed   FALL RISK PREVENTION PERTAINING TO THE HOME:  Any stairs in or around the home? No  If so, do they handrails? No   Home free of loose throw rugs in walkways, pet beds, electrical cords, etc? Yes  Adequate lighting in your home to reduce risk of falls? Yes   ASSISTIVE DEVICES UTILIZED TO PREVENT FALLS:  Life alert? No  Use of a cane, walker or w/c? Yes  Grab bars in the bathroom? Yes  Shower chair or bench in shower? Yes  Elevated toilet seat or a handicapped toilet? No  DME ORDERS:  DME order needed?  No   TIMED UP AND GO:  Was the test performed? Yes .  Length of time to ambulate 10 feet: 8 sec.   GAIT:  Appearance of gait:  Gait slow, steady and with the use of an assistive device.   Education: Fall risk prevention has been discussed.  Intervention(s) required? No   Depression Screen PHQ 2/9 Scores 10/10/2018 06/16/2018 02/13/2018 10/04/2017  PHQ - 2 Score 0 0 0 0  PHQ- 9 Score - 2 - 0    Cognitive Function MMSE - Mini Mental State Exam 10/10/2018 11/02/2017  Orientation to time 2 3  Orientation to Place 4 4  Registration 3 3  Attention/ Calculation 5 0  Recall 0 0  Language- name 2 objects 2 2  Language- repeat 1 0  Language- follow 3 step command 3 2  Language- read & follow direction 1 1  Write a sentence 1 1  Copy design 1 0  Total score 23 16     6CIT Screen 10/04/2017 10/04/2016  What Year? 0 points 0 points  What month? 0 points 0 points  What time? 3 points 0 points  Count back from 20 4 points 0 points  Months in reverse 4 points 4 points  Repeat phrase 10 points 8 points  Total Score 21 12    Immunization History  Administered Date(s) Administered  . Influenza, High Dose Seasonal PF 05/02/2014, 04/30/2015, 05/02/2017, 04/24/2018  . Influenza, Seasonal, Injecte, Preservative Fre 04/13/2012  . Influenza,inj,Quad PF,6+ Mos 05/03/2013  . Influenza-Unspecified 05/02/2014  . Pneumococcal Conjugate-13 06/13/2014    . Pneumococcal Polysaccharide-23 06/12/2013  . Tdap 10/05/2017    Qualifies for Shingles Vaccine? Yes  . Due for Shingrix. Education has been provided regarding the importance of this vaccine. Pt has been advised to call insurance company to determine out of pocket expense. Advised may also receive vaccine at local pharmacy or Health Dept. Verbalized acceptance and understanding.  Tdap: Up to date  Flu Vaccine: Up to date  Pneumococcal Vaccine: Up to date   Screening Tests Health Maintenance  Topic Date Due  . FOOT EXAM  10/05/2018  . URINE MICROALBUMIN  10/05/2018  . HEMOGLOBIN A1C  12/15/2018  . OPHTHALMOLOGY EXAM  06/07/2019  . TETANUS/TDAP  10/06/2027  . INFLUENZA VACCINE  Completed  . PNA vac Low Risk Adult  Completed   Cancer Screenings:  Colorectal Screening:  No longer required.   Lung Cancer Screening: (Low Dose CT Chest recommended if Age 40-80 years, 30 pack-year currently smoking OR have quit w/in 15years.) does not qualify.   Additional  Screening:  Hepatitis C Screening: no longer required  Vision Screening: Recommended annual ophthalmology exams for early detection of glaucoma and other disorders of the eye. Is the patient up to date with their annual eye exam?  Yes  Who is the provider or what is the name of the office in which the pt attends annual eye exams? [place] Screening: Recommended annual dental exams for proper oral hygiene  Community Resource Referral:  CRR required this visit?  No       Plan:    I have personally reviewed and addressed the Medicare Annual Wellness questionnaire and have noted the following in the patient's chart:  A. Medical and social history B. Use of alcohol, tobacco or illicit drugs  C. Current medications and supplements D. Functional ability and status E.  Nutritional status F.  Physical activity G. Advance directives H. List of other physicians I.  Hospitalizations, surgeries, and ER  visits in previous 12 months J.  [place] such as hearing and vision if needed, cognitive and depression L. Referrals and appointments   In addition, I have reviewed and discussed with patient certain preventive protocols, quality metrics, and best practice recommendations. A written personalized care plan for preventive services as well as general preventive health recommendations were provided to patient.   Signed,  Clemetine Marker, LPN Nurse Health Advisor   Nurse Notes: pt seen by urology 10/02/18 and scheduled for urodynamics testing on 10/17/18. Pt also seeing Dr. Ancil Boozer today.

## 2018-10-10 NOTE — Patient Instructions (Signed)
Mr. Roy Hernandez , Thank you for taking time to come for your Medicare Wellness Visit. I appreciate your ongoing commitment to your health goals. Please review the following plan we discussed and let me know if I can assist you in the future.   Screening recommendations/referrals: Colonoscopy: no longer required Recommended yearly ophthalmology/optometry visit for glaucoma screening and checkup Recommended yearly dental visit for hygiene and checkup  Vaccinations: Influenza vaccine: done 04/24/18 Pneumococcal vaccine: done 06/13/14 Tdap vaccine: done 10/05/17 Shingles vaccine: Shingrix discussed. Please contact your pharmacy for coverage information.   Conditions/risks identified: Recommend preventing falls by continuing physical therapy and balance exercises.   Next appointment: Please follow up in one year for your Medicare Annual Wellness visit.    Preventive Care 83 Years and Older, Male Preventive care refers to lifestyle choices and visits with your health care provider that can promote health and wellness. What does preventive care include?  A yearly physical exam. This is also called an annual well check.  Dental exams once or twice a year.  Routine eye exams. Ask your health care provider how often you should have your eyes checked.  Personal lifestyle choices, including:  Daily care of your teeth and gums.  Regular physical activity.  Eating a healthy diet.  Avoiding tobacco and drug use.  Limiting alcohol use.  Practicing safe sex.  Taking low doses of aspirin every day.  Taking vitamin and mineral supplements as recommended by your health care provider. What happens during an annual well check? The services and screenings done by your health care provider during your annual well check will depend on your age, overall health, lifestyle risk factors, and family history of disease. Counseling  Your health care provider may ask you questions about your:  Alcohol  use.  Tobacco use.  Drug use.  Emotional well-being.  Home and relationship well-being.  Sexual activity.  Eating habits.  History of falls.  Memory and ability to understand (cognition).  Work and work Statistician. Screening  You may have the following tests or measurements:  Height, weight, and BMI.  Blood pressure.  Lipid and cholesterol levels. These may be checked every 5 years, or more frequently if you are over 24 years old.  Skin check.  Lung cancer screening. You may have this screening every year starting at age 83 if you have a 30-pack-year history of smoking and currently smoke or have quit within the past 15 years.  Fecal occult blood test (FOBT) of the stool. You may have this test every year starting at age 83.  Flexible sigmoidoscopy or colonoscopy. You may have a sigmoidoscopy every 5 years or a colonoscopy every 10 years starting at age 45.  Prostate cancer screening. Recommendations will vary depending on your family history and other risks.  Hepatitis C blood test.  Hepatitis B blood test.  Sexually transmitted disease (STD) testing.  Diabetes screening. This is done by checking your blood sugar (glucose) after you have not eaten for a while (fasting). You may have this done every 1-3 years.  Abdominal aortic aneurysm (AAA) screening. You may need this if you are a current or former smoker.  Osteoporosis. You may be screened starting at age 83 if you are at high risk. Talk with your health care provider about your test results, treatment options, and if necessary, the need for more tests. Vaccines  Your health care provider may recommend certain vaccines, such as:  Influenza vaccine. This is recommended every year.  Tetanus, diphtheria, and acellular  pertussis (Tdap, Td) vaccine. You may need a Td booster every 10 years.  Zoster vaccine. You may need this after age 83.  Pneumococcal 13-valent conjugate (PCV13) vaccine. One dose is  recommended after age 83.  Pneumococcal polysaccharide (PPSV23) vaccine. One dose is recommended after age 83. Talk to your health care provider about which screenings and vaccines you need and how often you need them. This information is not intended to replace advice given to you by your health care provider. Make sure you discuss any questions you have with your health care provider. Document Released: 08/15/2015 Document Revised: 04/07/2016 Document Reviewed: 05/20/2015 Elsevier Interactive Patient Education  2017 [place] Prevention in the Home Falls can cause injuries. They can happen to people of all ages. There are many things you can do to make your home safe and to help prevent falls. What can I do on the outside of my home?  Regularly fix the edges of walkways and driveways and fix any cracks.  Remove anything that might make you trip as you walk through a door, such as a raised step or threshold.  Trim any bushes or trees on the path to your home.  Use bright outdoor lighting.  Clear any walking paths of anything that might make someone trip, such as rocks or tools.  Regularly check to see if handrails are loose or broken. Make sure that both sides of any steps have handrails.  Any raised decks and porches should have guardrails on the edges.  Have any leaves, snow, or ice cleared regularly.  Use sand or salt on walking paths during winter.  Clean up any spills in your garage right away. This includes oil or grease spills. What can I do in the bathroom?  Use night lights.  Install grab bars by the toilet and in the tub and shower. Do not use towel bars as grab bars.  Use non-skid mats or decals in the tub or shower.  If you need to sit down in the shower, use a plastic, non-slip stool.  Keep the floor dry. Clean up any water that spills on the floor as soon as it happens.  Remove soap buildup in the tub or shower regularly.  Attach bath mats  securely with double-sided non-slip rug tape.  Do not have throw rugs and other things on the floor that can make you trip. What can I do in the bedroom?  Use night lights.  Make sure that you have a light by your bed that is easy to reach.  Do not use any sheets or blankets that are too big for your bed. They should not hang down onto the floor.  Have a firm chair that has side arms. You can use this for support while you get dressed.  Do not have throw rugs and other things on the floor that can make you trip. What can I do in the kitchen?  Clean up any spills right away.  Avoid walking on wet floors.  Keep items that you use a lot in easy-to-reach places.  If you need to reach something above you, use a strong step stool that has a grab bar.  Keep electrical cords out of the way.  Do not use floor polish or wax that makes floors slippery. If you must use wax, use non-skid floor wax.  Do not have throw rugs and other things on the floor that can make you trip. What can I do with my stairs?  Do not leave any items on the stairs.  Make sure that there are handrails on both sides of the stairs and use them. Fix handrails that are broken or loose. Make sure that handrails are as long as the stairways.  Check any carpeting to make sure that it is firmly attached to the stairs. Fix any carpet that is loose or worn.  Avoid having throw rugs at the top or bottom of the stairs. If you do have throw rugs, attach them to the floor with carpet tape.  Make sure that you have a light switch at the top of the stairs and the bottom of the stairs. If you do not have them, ask someone to add them for you. What else can I do to help prevent falls?  Wear shoes that:  Do not have high heels.  Have rubber bottoms.  Are comfortable and fit you well.  Are closed at the toe. Do not wear sandals.  If you use a stepladder:  Make sure that it is fully opened. Do not climb a closed  stepladder.  Make sure that both sides of the stepladder are locked into place.  Ask someone to hold it for you, if possible.  Clearly mark and make sure that you can see:  Any grab bars or handrails.  First and last steps.  Where the edge of each step is.  Use tools that help you move around (mobility aids) if they are needed. These include:  Canes.  Walkers.  Scooters.  Crutches.  Turn on the lights when you go into a dark area. Replace any light bulbs as soon as they burn out.  Set up your furniture so you have a clear path. Avoid moving your furniture around.  If any of your floors are uneven, fix them.  If there are any pets around you, be aware of where they are.  Review your medicines with your doctor. Some medicines can make you feel dizzy. This can increase your chance of falling. Ask your doctor what other things that you can do to help prevent falls. This information is not intended to replace advice given to you by your health care provider. Make sure you discuss any questions you have with your health care provider. Document Released: 05/15/2009 Document Revised: 12/25/2015 Document Reviewed: 08/23/2014 Elsevier Interactive Patient Education  2017 [employer].

## 2018-10-10 NOTE — Progress Notes (Signed)
Name: Roy Hernandez   MRN: [id number]    DOB: [birth year]   Date:10/10/2018       Progress Note  Subjective  Chief Complaint  Chief Complaint  Patient presents with  . Diabetes  . Hypertension  . Hyperglycemia    HPI  Urinary retention: on CT, over the past year has urgency but inability to void, urinary frequency, hesitancy, no blood in urine, no fever or chills. He is seeing Urologist and is taking Flomax for the past week, doing slightly better, not getting as often at night   DMII: fsbs not being checked at home, not on medications, he denies polyphagia, polydipsia or polyuria. He is on pravastatin   MGUS: going on to see Dr. Janese Banks every 6 months with lab work after 3 months   Cognitive dysfunction : taking Aricept, progressing, daughters helps taking care of his money, doctor's visit, they also prepare her food. No longer using home health nurse because it took so long for them to go check on him. Still stays at home alone. He drives rarely a few times this year, only drives 5 miles to his girlfriends house.   Balance problems; he has PAD, using a cane, likely multifactorial , he has been getting PT . He feels like it has been helpful, doing exercises at home   Senile Purpura: stable on both arms.   PVD and AAA: he has follow up with Dr. Payton Mccallum March 2020 , not on aspirin because of risk of fall, taking statin therapy   Patient Active Problem List   Diagnosis Date Noted  . Dyslipidemia associated with type 2 diabetes mellitus ([place]) 09/01/2018  . Senile purpura ([place]) 09/01/2018  . Neuropathy 06/16/2018  . 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 Left bundle-branch block 02/13/2018  . History of right inguinal hernia repair 12/16/2017  . Epiretinal membrane (ERM) of right eye 10/11/2017  . DJD (degenerative joint disease) 10/04/2017  . Hyperglycemia 10/04/2017  . Advanced stage glaucoma 10/04/2017  . Hypertension, benign 07/06/2017  . PAD (peripheral artery disease) ([place])  10/19/2016  . Osteoporosis of femur without pathological fracture 11/25/2015  . AAA (abdominal aortic aneurysm) without rupture ([place]) 11/05/2014  . Abnormal ECG 11/05/2014  . At risk for falling 11/05/2014  . DD (diverticular disease) 11/05/2014  . Dyslipidemia 11/05/2014  . Failure of erection 11/05/2014  . Degenerative arthritis of lumbar spine 11/05/2014  . Compressed spine fracture ([place]) 11/05/2014  . Basal cell papilloma 11/05/2014  . Vitamin D deficiency 11/05/2014    Past Surgical History:  Procedure Laterality Date  . CATARACT EXTRACTION Bilateral   . HERNIA REPAIR  June 2019  . INGUINAL HERNIA REPAIR Right 01/27/2018   Medium Ultra Pro mesh.  Surgeon: Robert Bellow, MD;  Location: [facility] ORS;  Service: General;  Laterality: Right;  with mesh  . TOOTH EXTRACTION      Family History  Problem Relation Age of Onset  . Cancer Mother   . Hypertension Mother   . Diabetes Father   . Hyperlipidemia Father   . Diabetes Maternal Grandmother   . Diabetes Brother   . Kidney disease Brother        on dialysis  . Diabetes Sister   . Kidney disease Sister        dialysis  . Cancer Brother        esophageal  . Leukemia Sister     Social History   Socioeconomic History  . Marital status: Widowed    Spouse name: [name]  .  Number of children: 3  . Years of education: Not on file  . Highest education level: 9th grade  Occupational History    Employer: [employer]  Social Needs  . Financial resource strain: Not hard at all  . Food insecurity:    Worry: Never true    Inability: Never true  . Transportation needs:    Medical: No    Non-medical: No  Tobacco Use  . Smoking status: Former Smoker    Packs/day: 1.00    Years: 34.00    Pack years: 34.00    Types: Cigarettes    Start date: 08/02/1945    Last attempt to quit: 05/26/1980    Years since quitting: 38.4  . Smokeless tobacco: Never Used  . Tobacco comment: smoking cessation materials not required  Substance and  Sexual Activity  . Alcohol use: No    Alcohol/week: 0.0 standard drinks  . Drug use: No  . Sexual activity: Not Currently  Lifestyle  . Physical activity:    Days per week: 3 days    Minutes per session: 20 min  . Stress: Not at all  Relationships  . Social connections:    Talks on phone: More than three times a week    Gets together: Twice a week    Attends religious service: More than 4 times per year    Active member of club or organization: No    Attends meetings of clubs or organizations: Never    Relationship status: Widowed  . Intimate partner violence:    Fear of current or ex partner: No    Emotionally abused: No    Physically abused: No    Forced sexual activity: No  Other Topics Concern  . Not on file  Social History Narrative  . Not on file     Current Outpatient Medications:  .  acetaminophen (TYLENOL) 500 MG tablet, Take 500 mg by mouth at bedtime., Disp: , Rfl:  .  [START ON 10/16/2018] alendronate (FOSAMAX) 70 MG tablet, Take 1 tablet (70 mg total) by mouth every Monday. Take with a full glass of water on an empty stomach., Disp: 12 tablet, Rfl: 3 .  aspirin EC 81 MG tablet, Take 81 mg by mouth at bedtime., Disp: , Rfl:  .  Carboxymethylcellulose Sodium (LUBRICANT EYE DROPS PF OP), Place 1 drop into both eyes 3 (three) times daily as needed (for dry eyes.). , Disp: , Rfl:  .  cholecalciferol (VITAMIN D) 1000 UNITS tablet, Take 1,000 Units by mouth daily. , Disp: , Rfl:  .  donepezil (ARICEPT) 10 MG tablet, Take 1 tablet (10 mg total) by mouth at bedtime., Disp: 90 tablet, Rfl: 1 .  fluticasone (FLONASE) 50 MCG/ACT nasal spray, Place 2 sprays into both nostrils daily., Disp: 48 g, Rfl: 1 .  latanoprost (XALATAN) 0.005 % ophthalmic solution, Place 1 drop into both eyes at bedtime. , Disp: , Rfl:  .  Menthol-Methyl Salicylate (MUSCLE RUB EX), Apply 1 application topically 4 (four) times daily as needed (for pain.). , Disp: , Rfl:  .  ONE TOUCH ULTRA TEST test  strip, TEST BLOOD GLUCOSE LEVEL ONCE A DAY., Disp: 90 each, Rfl: 1 .  simvastatin (ZOCOR) 5 MG tablet, Take 1 tablet (5 mg total) by mouth at bedtime., Disp: 90 tablet, Rfl: 1 .  tamsulosin (FLOMAX) 0.4 MG CAPS capsule, Take 1 capsule (0.4 mg total) by mouth daily., Disp: 30 capsule, Rfl: 3 .  timolol (TIMOPTIC) 0.5 % ophthalmic solution, Place 1 drop  into both eyes daily. , Disp: , Rfl:  .  vitamin B-12 (CYANOCOBALAMIN) 1000 MCG tablet, Take 1,000 mcg by mouth daily. , Disp: , Rfl:   No Known Allergies  I personally reviewed active problem list, medication list, allergies, family history, social history with the patient/caregiver today.   ROS  Constitutional: Negative for fever or weight change.  Respiratory: Negative for cough and shortness of breath.   Cardiovascular: Negative for chest pain or palpitations.  Gastrointestinal: Negative for abdominal pain, no bowel changes.  Musculoskeletal: Positive  for gait problem but no  joint swelling.  Skin: Negative for rash.  Neurological: Negative for dizziness or headache.  No other specific complaints in a complete review of systems (except as listed in HPI above).   Objective  Vitals:   10/10/18 1447  BP: (!) 92/50  Pulse: 67  Resp: 15  Temp: 98.1 F (36.7 C)  TempSrc: Oral  SpO2: 95%  Weight: 159 lb (72.1 kg)  Height: '5\' 7"'  (1.702 m)    Body mass index is 24.9 kg/m.  Physical Exam  Constitutional: Patient appears frail, using a cane No distress.  HEENT: head atraumatic, normocephalic, pupils equal and reactive to light, neck supple, throat within normal limits Cardiovascular: Normal rate, regular rhythm and normal heart sounds.  No murmur heard. No BLE edema. Pulmonary/Chest: Effort normal and breath sounds normal. No respiratory distress. Abdominal: Soft.  There is no tenderness. Psychiatric: Patient has a normal mood and affect. Difficulty hearing   Recent Results (from the past 2160 hour(s))  POCT urinalysis  dipstick     Status: Abnormal   Collection Time: 09/01/18  1:57 PM  Result Value Ref Range   Color, UA yellow    Clarity, UA clear    Glucose, UA Negative Negative   Bilirubin, UA neg    Ketones, UA neg    Spec Grav, UA 1.015 1.010 - 1.025   Blood, UA trace    pH, UA 5.0 5.0 - 8.0   Protein, UA Positive (A) Negative    Comment: Trace   Urobilinogen, UA 0.2 0.2 or 1.0 E.U./dL   Nitrite, UA neg    Leukocytes, UA Trace (A) Negative   Appearance     Odor    CBC with Differential/Platelet     Status: Abnormal   Collection Time: 09/01/18  2:51 PM  Result Value Ref Range   WBC 5.7 3.8 - 10.8 Thousand/uL   RBC 4.03 (L) 4.20 - 5.80 Million/uL   Hemoglobin 13.2 13.2 - 17.1 g/dL   HCT 37.8 (L) 38.5 - 50.0 %   MCV 93.8 80.0 - 100.0 fL   MCH 32.8 27.0 - 33.0 pg   MCHC 34.9 32.0 - 36.0 g/dL   RDW 11.8 11.0 - 15.0 %   Platelets 215 140 - 400 Thousand/uL   MPV 11.0 7.5 - 12.5 fL   Neutro Abs 3,939 1,500 - 7,800 cells/uL   Lymphs Abs 1,117 850 - 3,900 cells/uL   Absolute Monocytes 462 200 - 950 cells/uL   Eosinophils Absolute 143 15 - 500 cells/uL   Basophils Absolute 40 0 - 200 cells/uL   Neutrophils Relative % 69.1 %   Total Lymphocyte 19.6 %   Monocytes Relative 8.1 %   Eosinophils Relative 2.5 %   Basophils Relative 0.7 %  COMPLETE METABOLIC PANEL WITH GFR     Status: Abnormal   Collection Time: 09/01/18  2:51 PM  Result Value Ref Range   Glucose, Bld 105 (H) 65 - 99 mg/dL  Comment: .            Fasting reference interval . For someone without known diabetes, a glucose value between 100 and 125 mg/dL is consistent with prediabetes and should be confirmed with a follow-up test. .    BUN 17 7 - 25 mg/dL   Creat 0.99 0.70 - 1.11 mg/dL    Comment: For patients >38 years of age, the reference limit for Creatinine is approximately 13% higher for people identified as African-American. .    GFR, Est Non African American 67 > OR = 60 mL/min/1.30m   GFR, Est African American  78 > OR = 60 mL/min/1.728m  BUN/Creatinine Ratio NOT APPLICABLE 6 - 22 (calc)   Sodium 141 135 - 146 mmol/L   Potassium 4.3 3.5 - 5.3 mmol/L   Chloride 102 98 - 110 mmol/L   CO2 31 20 - 32 mmol/L   Calcium 9.7 8.6 - 10.3 mg/dL   Total Protein 6.5 6.1 - 8.1 g/dL   Albumin 3.7 3.6 - 5.1 g/dL   Globulin 2.8 1.9 - 3.7 g/dL (calc)   AG Ratio 1.3 1.0 - 2.5 (calc)   Total Bilirubin 0.4 0.2 - 1.2 mg/dL   Alkaline phosphatase (APISO) 93 40 - 115 U/L   AST 14 10 - 35 U/L   ALT 9 9 - 46 U/L  Thyroid Panel With TSH     Status: None   Collection Time: 09/01/18  2:51 PM  Result Value Ref Range   T3 Uptake 35 22 - 35 %   T4, Total 7.6 4.9 - 10.5 mcg/dL   Free Thyroxine Index 2.7 1.4 - 3.8   TSH 0.82 0.40 - 4.50 mIU/L  Urine Culture     Status: None   Collection Time: 09/01/18  2:51 PM  Result Value Ref Range   MICRO NUMBER: [id number]  SPECIMEN QUALITY: Adequate    Sample Source URINE    STATUS: FINAL    Result:      Multiple organisms present, each less than 10,000 CFU/mL. These organisms, commonly found on external and internal genitalia, are considered to be colonizers. No further testing performed.  Multiple Myeloma Panel (SPEP&IFE w/QIG)     Status: Abnormal   Collection Time: 09/08/18 10:40 AM  Result Value Ref Range   IgG (Immunoglobin G), Serum 663 (L) 700 - 1,600 mg/dL   IgA 1,457 (H) 61 - 437 mg/dL    Comment: (NOTE) Results confirmed on dilution.    IgM (Immunoglobulin M), Srm 11 (L) 15 - 143 mg/dL    Comment: Result confirmed on concentration.   Total Protein ELP 6.6 6.0 - 8.5 g/dL   Albumin SerPl Elph-Mcnc 3.7 2.9 - 4.4 g/dL   Alpha 1 0.2 0.0 - 0.4 g/dL   Alpha2 Glob SerPl Elph-Mcnc 0.7 0.4 - 1.0 g/dL   B-Globulin SerPl Elph-Mcnc 1.5 (H) 0.7 - 1.3 g/dL   Gamma Glob SerPl Elph-Mcnc 0.4 0.4 - 1.8 g/dL   M Protein SerPl Elph-Mcnc 0.8 (H) Not Observed g/dL   Globulin, Total 2.9 2.2 - 3.9 g/dL   Albumin/Glob SerPl 1.3 0.7 - 1.7   IFE 1 Comment     Comment:  (NOTE) Immunofixation shows IgA monoclonal protein with lambda light chain specificity.    Please Note Comment     Comment: (NOTE) Protein electrophoresis scan will follow via computer, mail, or courier delivery. Performed At: [id number] FarmerD PhZY:[id number] Comprehensive metabolic panel  Status: Abnormal   Collection Time: 09/08/18 10:40 AM  Result Value Ref Range   Sodium 138 135 - 145 mmol/L   Potassium 4.4 3.5 - 5.1 mmol/L   Chloride 103 98 - 111 mmol/L   CO2 30 22 - 32 mmol/L   Glucose, Bld 106 (H) 70 - 99 mg/dL   BUN 21 8 - 23 mg/dL   Creatinine, Ser 0.93 0.61 - 1.24 mg/dL   Calcium 9.1 8.9 - 10.3 mg/dL   Total Protein 7.1 6.5 - 8.1 g/dL   Albumin 3.8 3.5 - 5.0 g/dL   AST 17 15 - 41 U/L   ALT 11 0 - 44 U/L   Alkaline Phosphatase 113 38 - 126 U/L   Total Bilirubin 0.5 0.3 - 1.2 mg/dL   GFR calc non Af Amer >60 >60 mL/min   GFR calc Af Amer >60 >60 mL/min   Anion gap 5 5 - 15    Comment: Performed at [facility], [address] [place], [place] [address]  CBC with Differential/Platelet     Status: Abnormal   Collection Time: 09/08/18 10:40 AM  Result Value Ref Range   WBC 5.6 4.0 - 10.5 K/uL   RBC 4.11 (L) 4.22 - 5.81 MIL/uL   Hemoglobin 13.0 13.0 - 17.0 g/dL   HCT 39.6 39.0 - 52.0 %   MCV 96.4 80.0 - 100.0 fL   MCH 31.6 26.0 - 34.0 pg   MCHC 32.8 30.0 - 36.0 g/dL   RDW 12.8 11.5 - 15.5 %   Platelets 196 150 - 400 K/uL   nRBC 0.0 0.0 - 0.2 %   Neutrophils Relative % 68 %   Neutro Abs 3.8 1.7 - 7.7 K/uL   Lymphocytes Relative 19 %   Lymphs Abs 1.1 0.7 - 4.0 K/uL   Monocytes Relative 10 %   Monocytes Absolute 0.6 0.1 - 1.0 K/uL   Eosinophils Relative 2 %   Eosinophils Absolute 0.1 0.0 - 0.5 K/uL   Basophils Relative 1 %   Basophils Absolute 0.0 0.0 - 0.1 K/uL   Immature Granulocytes 0 %   Abs Immature Granulocytes 0.01 0.00 - 0.07 K/uL    Comment: Performed at [facility],  [address] [place], [place] [address]  Kappa/lambda light chains     Status: Abnormal   Collection Time: 09/08/18 10:40 AM  Result Value Ref Range   Kappa free light chain 20.5 (H) 3.3 - 19.4 mg/L   Lamda free light chains 70.5 (H) 5.7 - 26.3 mg/L   Kappa, lamda light chain ratio 0.29 0.26 - 1.65    Comment: (NOTE) Performed At: [facility] [place], [place] [id number] Rush Farmer MD [id number]   Bladder Scan (Post Void Residual) in office     Status: None   Collection Time: 10/02/18 10:06 AM  Result Value Ref Range   Scan Result 375m   Urinalysis, Complete     Status: Abnormal   Collection Time: 10/02/18 10:10 AM  Result Value Ref Range   Specific Gravity, UA 1.025 1.005 - 1.030   pH, UA 6.5 5.0 - 7.5   Color, UA Yellow Yellow   Appearance Ur Clear Clear   Leukocytes, UA Trace (A) Negative   Protein, UA Negative Negative/Trace   Glucose, UA Negative Negative   Ketones, UA Negative Negative   RBC, UA Negative Negative   Bilirubin, UA Negative Negative   Urobilinogen, Ur 0.2 0.2 - 1.0 mg/dL   Nitrite, UA Negative Negative  Microscopic Examination See below:   Microscopic Examination     Status: None   Collection Time: 10/02/18 10:10 AM  Result Value Ref Range   WBC, UA 0-5 0 - 5 /hpf   RBC, UA None seen 0 - 2 /hpf   Epithelial Cells (non renal) None seen 0 - 10 /hpf   Bacteria, UA None seen None seen/Few  POCT HgB A1C     Status: Normal   Collection Time: 10/10/18  2:49 PM  Result Value Ref Range   Hemoglobin A1C     HbA1c POC (<> result, manual entry)     HbA1c, POC (prediabetic range)     HbA1c, POC (controlled diabetic range) 5.3 0.0 - 7.0 %    Diabetic Foot Exam: Diabetic Foot Exam - Simple   Simple Foot Form Diabetic Foot exam was performed with the following findings:  Yes 10/10/2018  3:09 PM  Visual Inspection No deformities, no ulcerations, no other skin breakdown bilaterally:  Yes Sensation Testing See comments:   Yes Pulse Check Posterior Tibialis and Dorsalis pulse intact bilaterally:  Yes Comments Failed on right foot      PHQ2/9: Depression screen [facility] 2/9 10/10/2018 10/10/2018 06/16/2018 02/13/2018 10/04/2017  Decreased Interest 0 0 0 0 0  Down, Depressed, Hopeless 0 0 0 0 0  PHQ - 2 Score 0 0 0 0 0  Altered sleeping 0 - 1 - 0  Tired, decreased energy 0 - 1 - 0  Change in appetite 0 - 0 - 0  Feeling bad or failure about yourself  0 - 0 - 0  Trouble concentrating 1 - 0 - 0  Moving slowly or fidgety/restless 0 - 0 - 0  Suicidal thoughts 0 - 0 - 0  PHQ-9 Score 1 - 2 - 0  Difficult doing work/chores Not difficult at all - Not difficult at all - Not difficult at all   Negative phq9   Fall Risk: Fall Risk  10/10/2018 10/10/2018 09/01/2018 06/16/2018 02/13/2018  Falls in the past year? 0 1 1 0 No  Number falls in past yr: 0 1 1 - -  Injury with Fall? 0 1 1 - -  Risk Factor Category  - - - - -  Risk for fall due to : - History of fall(s);Impaired balance/gait Impaired balance/gait;History of fall(s) - -  Risk for fall due to: Comment - - - - -  Follow up - Falls prevention discussed - - -     Assessment & Plan  1. Dyslipidemia associated with type 2 diabetes mellitus (HCC)  - POCT HgB A1C - Urine Microalbumin w/creat. ratio  2. Osteoporosis of femur without pathological fracture  - alendronate (FOSAMAX) 70 MG tablet; Take 1 tablet (70 mg total) by mouth every Monday. Take with a full glass of water on an empty stomach.  Dispense: 12 tablet; Refill: 3  3. Persistent cognitive impairment  - donepezil (ARICEPT) 10 MG tablet; Take 1 tablet (10 mg total) by mouth at bedtime.  Dispense: 90 tablet; Refill: 1  4. Dyslipidemia  - simvastatin (ZOCOR) 5 MG tablet; Take 1 tablet (5 mg total) by mouth at bedtime.  Dispense: 90 tablet; Refill: 1  5. Senile purpura ([place])  Keep monitoring, reassurance  6. PAD (peripheral artery disease) ([place])  Keep follow up with vascular surgeon  7. Low  blood pressure reading  Since started on Flomax, advised to monitor, avoid getting up to use bathroom at night

## 2018-10-23 ENCOUNTER — Ambulatory Visit: Payer: Medicare HMO | Admitting: Urology

## 2018-10-30 ENCOUNTER — Ambulatory Visit (INDEPENDENT_AMBULATORY_CARE_PROVIDER_SITE_OTHER): Payer: Medicare HMO | Admitting: Vascular Surgery

## 2018-10-30 ENCOUNTER — Encounter (INDEPENDENT_AMBULATORY_CARE_PROVIDER_SITE_OTHER): Payer: Medicare HMO

## 2018-11-02 NOTE — Telephone Encounter (Signed)
Spoke with [facility] and the UDS referral has been put on hold, the patient can cb to reschd at anytime. Results app cx  Spoke with patient's daughter and scheduled a follow up app with [facility] for 3 months.   Sharyn Lull

## 2018-11-02 NOTE — Telephone Encounter (Signed)
-----   Message from Billey Co, MD sent at 11/01/2018  5:21 PM EDT ----- Regarding: appt change Discussed with his daughter, she wants to keep him home with COVID.  Please cancel his upcoming UDS in [place], and change his follow up with me to 3-4 months with PVR.  Thanks Nickolas Madrid, MD 11/01/2018

## 2018-11-23 ENCOUNTER — Ambulatory Visit: Payer: Medicare HMO | Admitting: Urology

## 2018-11-23 ENCOUNTER — Encounter: Payer: Self-pay | Admitting: Oncology

## 2018-11-30 ENCOUNTER — Inpatient Hospital Stay: Payer: Medicare HMO

## 2018-12-07 ENCOUNTER — Ambulatory Visit: Payer: 59 | Admitting: Oncology

## 2018-12-28 ENCOUNTER — Encounter (INDEPENDENT_AMBULATORY_CARE_PROVIDER_SITE_OTHER): Payer: Medicare HMO

## 2018-12-28 ENCOUNTER — Ambulatory Visit (INDEPENDENT_AMBULATORY_CARE_PROVIDER_SITE_OTHER): Payer: Medicare HMO | Admitting: Vascular Surgery

## 2018-12-28 ENCOUNTER — Encounter: Payer: Self-pay | Admitting: Family Medicine

## 2019-01-08 DIAGNOSIS — H26491 Other secondary cataract, right eye: Secondary | ICD-10-CM | POA: Diagnosis not present

## 2019-01-08 DIAGNOSIS — H34831 Tributary (branch) retinal vein occlusion, right eye, with macular edema: Secondary | ICD-10-CM | POA: Diagnosis not present

## 2019-01-23 ENCOUNTER — Other Ambulatory Visit: Payer: Self-pay | Admitting: Family Medicine

## 2019-01-23 MED ORDER — TAMSULOSIN HCL 0.4 MG PO CAPS: 0.4000 mg | ORAL_CAPSULE | Freq: Every day | ORAL | 3 refills | Status: DC

## 2019-01-26 ENCOUNTER — Other Ambulatory Visit: Payer: Self-pay | Admitting: Urology

## 2019-01-26 NOTE — Telephone Encounter (Signed)
Pt is almost out of flomax, daughter said she spoke to someone whom said they would call it in right away but has not been sent. Please send to [facility] in [place].

## 2019-01-29 ENCOUNTER — Other Ambulatory Visit: Payer: Self-pay

## 2019-01-29 MED ORDER — TAMSULOSIN HCL 0.4 MG PO CAPS: 0.4000 mg | ORAL_CAPSULE | Freq: Every day | ORAL | 3 refills | Status: DC

## 2019-01-30 ENCOUNTER — Other Ambulatory Visit: Payer: Self-pay

## 2019-01-30 ENCOUNTER — Inpatient Hospital Stay: Payer: Medicare HMO | Attending: Oncology

## 2019-01-30 DIAGNOSIS — D472 Monoclonal gammopathy: Secondary | ICD-10-CM | POA: Insufficient documentation

## 2019-01-30 LAB — COMPREHENSIVE METABOLIC PANEL
ALT: 10 U/L (ref 0–44)
AST: 15 U/L (ref 15–41)
Albumin: 3.7 g/dL (ref 3.5–5.0)
Alkaline Phosphatase: 117 U/L (ref 38–126)
Anion gap: 7 (ref 5–15)
BUN: 19 mg/dL (ref 8–23)
CO2: 27 mmol/L (ref 22–32)
Calcium: 9 mg/dL (ref 8.9–10.3)
Chloride: 104 mmol/L (ref 98–111)
Creatinine, Ser: 0.78 mg/dL (ref 0.61–1.24)
GFR calc Af Amer: >60 mL/min (ref >60)
GFR calc non Af Amer: >60 mL/min (ref >60)
Glucose, Bld: 103 mg/dL — ABNORMAL HIGH (ref 70–99)
Potassium: 4.3 mmol/L (ref 3.5–5.1)
Sodium: 138 mmol/L (ref 135–145)
Total Bilirubin: 0.6 mg/dL (ref 0.3–1.2)
Total Protein: 7 g/dL (ref 6.5–8.1)

## 2019-01-30 LAB — CBC WITH DIFFERENTIAL/PLATELET
Abs Immature Granulocytes: 0.01 10*3/uL (ref 0.00–0.07)
Basophils Absolute: 0 10*3/uL (ref 0.0–0.1)
Basophils Relative: 1 %
Eosinophils Absolute: 0.2 10*3/uL (ref 0.0–0.5)
Eosinophils Relative: 3 %
HCT: 38.5 % — ABNORMAL LOW (ref 39.0–52.0)
Hemoglobin: 12.6 g/dL — ABNORMAL LOW (ref 13.0–17.0)
Immature Granulocytes: 0 %
Lymphocytes Relative: 20 %
Lymphs Abs: 1.1 10*3/uL (ref 0.7–4.0)
MCH: 31.7 pg (ref 26.0–34.0)
MCHC: 32.7 g/dL (ref 30.0–36.0)
MCV: 97 fL (ref 80.0–100.0)
Monocytes Absolute: 0.5 10*3/uL (ref 0.1–1.0)
Monocytes Relative: 9 %
Neutro Abs: 3.6 10*3/uL (ref 1.7–7.7)
Neutrophils Relative %: 67 %
Platelets: 164 10*3/uL (ref 150–400)
RBC: 3.97 MIL/uL — ABNORMAL LOW (ref 4.22–5.81)
RDW: 12.5 % (ref 11.5–15.5)
WBC: 5.3 10*3/uL (ref 4.0–10.5)
nRBC: 0 % (ref 0.0–0.2)

## 2019-01-31 LAB — MULTIPLE MYELOMA PANEL, SERUM
Albumin SerPl Elph-Mcnc: 3.5 g/dL (ref 2.9–4.4)
Albumin/Glob SerPl: 1.3 (ref 0.7–1.7)
Alpha 1: 0.2 g/dL (ref 0.0–0.4)
Alpha2 Glob SerPl Elph-Mcnc: 0.6 g/dL (ref 0.4–1.0)
B-Globulin SerPl Elph-Mcnc: 1.4 g/dL — ABNORMAL HIGH (ref 0.7–1.3)
Gamma Glob SerPl Elph-Mcnc: 0.6 g/dL (ref 0.4–1.8)
Globulin, Total: 2.7 g/dL (ref 2.2–3.9)
IgA: 1391 mg/dL — ABNORMAL HIGH (ref 61–437)
IgG (Immunoglobin G), Serum: 651 mg/dL (ref 603–1613)
IgM (Immunoglobulin M), Srm: 10 mg/dL — ABNORMAL LOW (ref 15–143)
M Protein SerPl Elph-Mcnc: 0.9 g/dL — ABNORMAL HIGH
Total Protein ELP: 6.2 g/dL (ref 6.0–8.5)

## 2019-01-31 LAB — KAPPA/LAMBDA LIGHT CHAINS
Kappa free light chain: 20.5 mg/L — ABNORMAL HIGH (ref 3.3–19.4)
Kappa, lambda light chain ratio: 0.3 (ref 0.26–1.65)
Lambda free light chains: 68 mg/L — ABNORMAL HIGH (ref 5.7–26.3)

## 2019-02-01 ENCOUNTER — Ambulatory Visit (INDEPENDENT_AMBULATORY_CARE_PROVIDER_SITE_OTHER): Payer: Medicare HMO | Admitting: Urology

## 2019-02-01 ENCOUNTER — Other Ambulatory Visit: Payer: Self-pay

## 2019-02-01 ENCOUNTER — Encounter: Payer: Self-pay | Admitting: Urology

## 2019-02-01 VITALS — BP 106/70 | HR 76 | Ht 67.0 in | Wt 160.0 lb

## 2019-02-01 DIAGNOSIS — R3914 Feeling of incomplete bladder emptying: Secondary | ICD-10-CM | POA: Diagnosis not present

## 2019-02-01 DIAGNOSIS — N401 Enlarged prostate with lower urinary tract symptoms: Secondary | ICD-10-CM

## 2019-02-01 DIAGNOSIS — R3911 Hesitancy of micturition: Secondary | ICD-10-CM

## 2019-02-01 LAB — BLADDER SCAN AMB NON-IMAGING

## 2019-02-01 MED ORDER — FINASTERIDE 5 MG PO TABS: 5.0000 mg | ORAL_TABLET | Freq: Every day | ORAL | 3 refills | Status: DC

## 2019-02-01 NOTE — Patient Instructions (Signed)

## 2019-02-01 NOTE — Progress Notes (Signed)
   02/01/2019 10:40 AM   Roy Hernandez Jul 01, 1928 [id number]  Reason for visit: Follow up BPH, elevated PVR  HPI: I saw Mr. Cass and his daughter in urology clinic today for follow-up of BPH with elevated PVRs.  He is a very nice 85 year old male with dementia who I originally saw in March 2020 for chronic urinary symptoms of weak stream, feeling of incomplete emptying, frequency, urgency, and nocturia 4-5 times per night.  There is no history of urinary retention or UTIs.  PVR at that time was 350 cc.  We started him on Flomax, and recommended urodynamics.  In the setting of the COVID-19 pandemic, he did not follow-up for urodynamics in [place].  He reports significant improvement on the Flomax and reports that he is voiding with a much better stream, and only getting up 0-1 times per night.  His primary complaint is difficulty initiating the stream sometimes.  Overall he is very happy with his urinary symptoms currently.  PVR in clinic today is 250 cc, which is improved from prior.   ROS: Please see flowsheet from today's date for complete review of systems.  Physical Exam: BP 106/70   Pulse 76   Ht '5\' 7"'$  (1.702 m)   Wt 160 lb (72.6 kg)   BMI 25.06 kg/m    Constitutional:  Alert and oriented, No acute distress. Respiratory: Normal respiratory effort, no increased work of breathing. GI: Abdomen is soft, nontender, nondistended, no abdominal masses Skin: No rashes, bruises or suspicious lesions. Neurologic: Grossly intact, no focal deficits, moving all 4 extremities. Psychiatric: Normal mood and affect  Laboratory Data: Creatinine 0.78, EGFR greater than 60  Assessment & Plan:   In summary, the patient is an 85 year old male with dementia with BPH and incomplete bladder emptying, with symptoms significantly improved on Flomax.  His PVR does remain elevated today at 250 cc, but is improved from 350 cc before starting Flomax.  He is minimally bothered by his symptoms at this time.   We discussed options moving forward including continuing Flomax, addition of finasteride for maximal medical therapy, or pursuing urodynamics and considering surgical options for bladder outlet obstruction.  The patient and his daughter would like to add finasteride and follow-up in 9 months for a PVR and renal ultrasound to rule out hydronephrosis.  This is very reasonable, especially in the setting of the COVID-19 pandemic with his age and comorbidities.  I counseled them extensively to seek care sooner if he develops recurrent urinary tract infections or urinary retention.  Add finasteride 5 mg daily, continue Flomax RTC 9 months with PVR and renal ultrasound If worsening symptoms, or bilateral hydro and distended bladder, pursue [facility]rodynamics in [facility]  A total of 15 minutes were spent face-to-face with the patient, greater than 50% was spent in patient education, counseling, and coordination of care regarding BPH and treatment options.   Billey Co, [facility] [address], [place], [place] [address] [phone]

## 2019-02-05 ENCOUNTER — Other Ambulatory Visit (INDEPENDENT_AMBULATORY_CARE_PROVIDER_SITE_OTHER): Payer: Self-pay | Admitting: Vascular Surgery

## 2019-02-05 DIAGNOSIS — I714 Abdominal aortic aneurysm, without rupture, unspecified: Secondary | ICD-10-CM

## 2019-02-06 ENCOUNTER — Inpatient Hospital Stay: Payer: Medicare HMO | Attending: Oncology | Admitting: Oncology

## 2019-02-06 ENCOUNTER — Encounter: Payer: Self-pay | Admitting: Oncology

## 2019-02-06 ENCOUNTER — Other Ambulatory Visit: Payer: Self-pay

## 2019-02-06 DIAGNOSIS — D472 Monoclonal gammopathy: Secondary | ICD-10-CM

## 2019-02-06 DIAGNOSIS — Z87891 Personal history of nicotine dependence: Secondary | ICD-10-CM | POA: Diagnosis not present

## 2019-02-06 NOTE — Progress Notes (Signed)
The patient daughter Roy Hernandez ) states the patient is having numbness to bilateral hand and feet. The patient Name and DOB has been verified by phone today.

## 2019-02-07 NOTE — Progress Notes (Signed)
I connected with Roy Hernandez on 02/07/19 at  2:30 PM EDT by telephone visit and verified that I am speaking with the correct person using two identifiers.   I discussed the limitations, risks, security and privacy concerns of performing an evaluation and management service by telemedicine and the availability of in-person appointments. I also discussed with the patient that there may be a patient responsible charge related to this service. The patient expressed understanding and agreed to proceed.  Other persons participating in the visit and their role in the encounter:  Patients daughter. Patient has dementia  Patient's location:  home Provider's location:  work  Risk analyst Complaint:  Discuss results of bloodwork  History of present illness: patient is a 85 year old male with a past medical history significant for hypertension, hyperlipidemia, osteoporosis, cognitive dysfunction, left bundle branch block and aneurysm of his aorta. He also recently underwent inguinal hernia surgery. He has been referred to [place] for Bence-Jones proteins detected in the urine. BMP from 02/08/2018 was within normal limits. Urine immunofixation showed lambda type Bence-Jones proteins.CBC from 01/20/2018 showed white count of 5.6, H&H of 13.5/39.1 and a platelet count of 151.   Patient is here with his daughter today. He reports doing well overall he does have chronic back pain.Following a fall a few years ago. He still lives alone and is independent of his ADLs and IADLs.  Results of blood work from 02/24/2018 were as follows: CBC showed white count of 5.2, H&H of 13.1/38.2 with an MCV of 94.1 and a platelet count of 166. CMP was within normal limits including kidney functions and calcium. Serum free light chains showed elevated lambda of 62.7 abnormal kappa lambda light chain ratio of 0.29. Myeloma panel showed elevated IgA of 1365 and M protein of 1 g which was IgM monoclonal lambda light chain specificity.  24-hour urine protein electrophoresis revealed 57 mg of protein but no M protein was observed and immunofixation was normal   Interval history patients daughter reports no acute issues with the patient presently. He has baseline dementia and is a poor historian     No Known Allergies  Past Medical History:  Diagnosis Date  . AAA (abdominal aortic aneurysm) ([place])   . Arthritis   . Cataract   . Dementia ([place])   . Diabetes mellitus without complication (HCC)    diet controlled  . Glaucoma   . Hyperkalemia   . Hyperlipidemia   . Hypertension   . Inguinal hernia of right side without obstruction or gangrene   . Osteoporosis   . Peripheral vascular disease ([place])   . Vitamin D deficiency     Past Surgical History:  Procedure Laterality Date  . CATARACT EXTRACTION Bilateral   . HERNIA REPAIR  June 2019  . INGUINAL HERNIA REPAIR Right 01/27/2018   Medium Ultra Pro mesh.  Surgeon: Robert Bellow, MD;  Location: [facility] ORS;  Service: General;  Laterality: Right;  with mesh  . TOOTH EXTRACTION      Social History   Socioeconomic History  . Marital status: Widowed    Spouse name: [name]  . Number of children: 3  . Years of education: Not on file  . Highest education level: 9th grade  Occupational History    Employer: [employer]  Social Needs  . Financial resource strain: Not hard at all  . Food insecurity    Worry: Never true    Inability: Never true  . Transportation needs    Medical: No    Non-medical:  No  Tobacco Use  . Smoking status: Former Smoker    Packs/day: 1.00    Years: 34.00    Pack years: 34.00    Types: Cigarettes    Start date: 08/02/1945    Quit date: 05/26/1980    Years since quitting: 38.7  . Smokeless tobacco: Never Used  . Tobacco comment: smoking cessation materials not required  Substance and Sexual Activity  . Alcohol use: No    Alcohol/week: 0.0 standard drinks  . Drug use: No  . Sexual activity: Not Currently  Lifestyle  . Physical  activity    Days per week: 3 days    Minutes per session: 20 min  . Stress: Not at all  Relationships  . Social connections    Talks on phone: More than three times a week    Gets together: Twice a week    Attends religious service: More than 4 times per year    Active member of club or organization: No    Attends meetings of clubs or organizations: Never    Relationship status: Widowed  . Intimate partner violence    Fear of current or ex partner: No    Emotionally abused: No    Physically abused: No    Forced sexual activity: No  Other Topics Concern  . Not on file  Social History Narrative  . Not on file    Family History  Problem Relation Age of Onset  . Cancer Mother   . Hypertension Mother   . Diabetes Father   . Hyperlipidemia Father   . Diabetes Maternal Grandmother   . Diabetes Brother   . Kidney disease Brother        on dialysis  . Diabetes Sister   . Kidney disease Sister        dialysis  . Cancer Brother        esophageal  . Leukemia Sister      Current Outpatient Medications:  .  acetaminophen (TYLENOL) 500 MG tablet, Take 500 mg by mouth at bedtime., Disp: , Rfl:  .  alendronate (FOSAMAX) 70 MG tablet, Take 1 tablet (70 mg total) by mouth every Monday. Take with a full glass of water on an empty stomach., Disp: 12 tablet, Rfl: 3 .  aspirin EC 81 MG tablet, Take 81 mg by mouth at bedtime., Disp: , Rfl:  .  Carboxymethylcellulose Sodium (LUBRICANT EYE DROPS PF OP), Place 1 drop into both eyes 3 (three) times daily as needed (for dry eyes.). , Disp: , Rfl:  .  cholecalciferol (VITAMIN D) 1000 UNITS tablet, Take 1,000 Units by mouth daily. , Disp: , Rfl:  .  donepezil (ARICEPT) 10 MG tablet, Take 1 tablet (10 mg total) by mouth at bedtime., Disp: 90 tablet, Rfl: 1 .  finasteride (PROSCAR) 5 MG tablet, Take 1 tablet (5 mg total) by mouth daily., Disp: 90 tablet, Rfl: 3 .  fluticasone (FLONASE) 50 MCG/ACT nasal spray, Place 2 sprays into both nostrils  daily., Disp: 48 g, Rfl: 1 .  latanoprost (XALATAN) 0.005 % ophthalmic solution, Place 1 drop into both eyes at bedtime. , Disp: , Rfl:  .  Menthol-Methyl Salicylate (MUSCLE RUB EX), Apply 1 application topically 4 (four) times daily as needed (for pain.). , Disp: , Rfl:  .  ONE TOUCH ULTRA TEST test strip, TEST BLOOD GLUCOSE LEVEL ONCE A DAY., Disp: 90 each, Rfl: 1 .  simvastatin (ZOCOR) 5 MG tablet, Take 1 tablet (5 mg total) by mouth at bedtime.,  Disp: 90 tablet, Rfl: 1 .  tamsulosin (FLOMAX) 0.4 MG CAPS capsule, Take 1 capsule (0.4 mg total) by mouth daily., Disp: 90 capsule, Rfl: 3 .  timolol (TIMOPTIC) 0.5 % ophthalmic solution, Place 1 drop into both eyes daily. , Disp: , Rfl:  .  vitamin B-12 (CYANOCOBALAMIN) 1000 MCG tablet, Take 1,000 mcg by mouth daily. , Disp: , Rfl:   No results found.  No images are attached to the encounter.   CMP Latest Ref Rng & Units 01/30/2019  Glucose 70 - 99 mg/dL 103(H)  BUN 8 - 23 mg/dL 19  Creatinine 0.61 - 1.24 mg/dL 0.78  Sodium 135 - 145 mmol/L 138  Potassium 3.5 - 5.1 mmol/L 4.3  Chloride 98 - 111 mmol/L 104  CO2 22 - 32 mmol/L 27  Calcium 8.9 - 10.3 mg/dL 9.0  Total Protein 6.5 - 8.1 g/dL 7.0  Total Bilirubin 0.3 - 1.2 mg/dL 0.6  Alkaline Phos 38 - 126 U/L 117  AST 15 - 41 U/L 15  ALT 0 - 44 U/L 10   CBC Latest Ref Rng & Units 01/30/2019  WBC 4.0 - 10.5 K/uL 5.3  Hemoglobin 13.0 - 17.0 g/dL 12.6(L)  Hematocrit 39.0 - 52.0 % 38.5(L)  Platelets 150 - 400 K/uL 164    Assessment and plan:This ia a 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  He continues to have a normal hemoglobin with an H&H of 12.6/28.5.  Calcium levels are normal and no evidence of kidney injury.  M protein IgA lambda remained stable between 0.8 to 0.9 g.  Serum free light chain ratio remains normal.  At this time I am inclined to monitor this conservatively I will repeat a CBC with differential, CMP, myeloma  panel and serum free light chains in 6 months and see him in my clinic at that time    I discussed the assessment and treatment plan with the patient. The patient was provided an opportunity to ask questions and all were answered. The patient agreed with the plan and demonstrated an understanding of the instructions.   The patient was advised to call back or seek an in-person evaluation if the symptoms worsen or if the condition fails to improve as anticipated.  I provided 12 minutes of non face-to-face telephone visit time during this encounter, and > 50% was spent counseling as documented under my assessment & plan.  Visit Diagnosis: 1. MGUS (monoclonal gammopathy of unknown significance)     Dr. Randa Evens, MD, MPH [facility] [phone] 02/07/2019 9:27 AM

## 2019-02-08 ENCOUNTER — Ambulatory Visit (INDEPENDENT_AMBULATORY_CARE_PROVIDER_SITE_OTHER): Payer: Medicare HMO | Admitting: Vascular Surgery

## 2019-02-08 ENCOUNTER — Encounter (INDEPENDENT_AMBULATORY_CARE_PROVIDER_SITE_OTHER): Payer: Medicare HMO

## 2019-02-08 ENCOUNTER — Encounter (INDEPENDENT_AMBULATORY_CARE_PROVIDER_SITE_OTHER): Payer: Self-pay | Admitting: Vascular Surgery

## 2019-02-08 ENCOUNTER — Ambulatory Visit (INDEPENDENT_AMBULATORY_CARE_PROVIDER_SITE_OTHER): Payer: Medicare HMO

## 2019-02-08 ENCOUNTER — Other Ambulatory Visit: Payer: Self-pay

## 2019-02-08 VITALS — BP 95/58 | HR 68 | Resp 16 | Ht 67.0 in | Wt 164.8 lb

## 2019-02-08 DIAGNOSIS — I714 Abdominal aortic aneurysm, without rupture, unspecified: Secondary | ICD-10-CM

## 2019-02-08 DIAGNOSIS — Z79899 Other long term (current) drug therapy: Secondary | ICD-10-CM | POA: Diagnosis not present

## 2019-02-08 DIAGNOSIS — I723 Aneurysm of iliac artery: Secondary | ICD-10-CM | POA: Diagnosis not present

## 2019-02-08 DIAGNOSIS — I739 Peripheral vascular disease, unspecified: Secondary | ICD-10-CM | POA: Diagnosis not present

## 2019-02-08 DIAGNOSIS — I10 Essential (primary) hypertension: Secondary | ICD-10-CM | POA: Diagnosis not present

## 2019-02-08 DIAGNOSIS — E785 Hyperlipidemia, unspecified: Secondary | ICD-10-CM

## 2019-02-08 DIAGNOSIS — Z87891 Personal history of nicotine dependence: Secondary | ICD-10-CM | POA: Diagnosis not present

## 2019-02-08 NOTE — Progress Notes (Signed)
MRN : [id number]  Roy Hernandez is a 85 y.o. (1928-06-07) male who presents with chief complaint of  Chief Complaint  Patient presents with  . Follow-up    ultrasound follow up  .  History of Present Illness:   The patient returns to the office for surveillance of a known abdominal aortic aneurysm. Patient denies abdominal pain or back pain, no other abdominal complaints. No changes suggesting embolic episodes.   There have been no interval changes in the patient's overall health care since his last visit.  Patient denies amaurosis fugax or TIA symptoms. There is no history of claudication or rest pain symptoms of the lower extremities. The patient denies angina or shortness of breath.   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 acetaminophen (TYLENOL) 500 MG tablet Take 500 mg by mouth at bedtime.  [name] alendronate (FOSAMAX) 70 MG tablet Take 1 tablet (70 mg total) by mouth every Monday. Take with a full glass of water on an empty stomach.  [name] aspirin EC 81 MG tablet Take 81 mg by mouth at bedtime.  . Carboxymethylcellulose Sodium (LUBRICANT EYE DROPS PF OP) Place 1 drop into both eyes 3 (three) times daily as needed (for dry eyes.).   [name] cholecalciferol (VITAMIN D) 1000 UNITS tablet Take 1,000 Units by mouth daily.   [name] donepezil (ARICEPT) 10 MG tablet Take 1 tablet (10 mg total) by mouth at bedtime.  . finasteride (PROSCAR) 5 MG tablet Take 1 tablet (5 mg total) by mouth daily.  . fluticasone (FLONASE) 50 MCG/ACT nasal spray Place 2 sprays into both nostrils daily.  [name] latanoprost (XALATAN) 0.005 % ophthalmic solution Place 1 drop into both eyes at bedtime.   . Menthol-Methyl Salicylate (MUSCLE RUB EX) Apply 1 application topically 4 (four) times daily as needed (for pain.).   [name] ONE TOUCH ULTRA TEST test strip TEST BLOOD GLUCOSE LEVEL ONCE A DAY.  . simvastatin (ZOCOR) 5 MG tablet Take 1  tablet (5 mg total) by mouth at bedtime.  . tamsulosin (FLOMAX) 0.4 MG CAPS capsule Take 1 capsule (0.4 mg total) by mouth daily.  . timolol (TIMOPTIC) 0.5 % ophthalmic solution Place 1 drop into both eyes daily.   . vitamin B-12 (CYANOCOBALAMIN) 1000 MCG tablet Take 1,000 mcg by mouth daily.     Past Medical History:  Diagnosis Date  . AAA (abdominal aortic aneurysm) ([place])   . Arthritis   . Cataract   . Dementia ([place])   . Diabetes mellitus without complication (HCC)    diet controlled  . Glaucoma   . Hyperkalemia   . Hyperlipidemia   . Hypertension   . Inguinal hernia of right side without obstruction or gangrene   . Osteoporosis   . Peripheral vascular disease ([place])   . Vitamin D deficiency     Past Surgical History:  Procedure Laterality Date  . CATARACT EXTRACTION Bilateral   . HERNIA REPAIR  June 2019  . INGUINAL HERNIA REPAIR Right 01/27/2018   Medium Ultra Pro mesh.  Surgeon: Robert Bellow, MD;  Location: [facility] ORS;  Service: General;  Laterality: Right;  with mesh  . TOOTH EXTRACTION      Social History Social History   Tobacco Use  . Smoking status: Former Smoker    Packs/day: 1.00    Years: 34.00    Pack years: 34.00    Types:  Cigarettes    Start date: 08/02/1945    Quit date: 05/26/1980    Years since quitting: 38.7  . Smokeless tobacco: Never Used  . Tobacco comment: smoking cessation materials not required  Substance Use Topics  . Alcohol use: No    Alcohol/week: 0.0 standard drinks  . Drug use: No    Family History Family History  Problem Relation Age of Onset  . Cancer Mother   . Hypertension Mother   . Diabetes Father   . Hyperlipidemia Father   . Diabetes Maternal Grandmother   . Diabetes Brother   . Kidney disease Brother        on dialysis  . Diabetes Sister   . Kidney disease Sister        dialysis  . Cancer Brother        esophageal  . Leukemia Sister     No Known Allergies   REVIEW OF SYSTEMS (Negative unless checked)   Constitutional: [] Weight loss  [] Fever  [] Chills Cardiac: [] Chest pain   [] Chest pressure   [] Palpitations   [] Shortness of breath when laying flat   [] Shortness of breath with exertion. Vascular:  [] Pain in legs with walking   [] Pain in legs at rest  [] History of DVT   [] Phlebitis   [] Swelling in legs   [] Varicose veins   [] Non-healing ulcers Pulmonary:   [] Uses home oxygen   [] Productive cough   [] Hemoptysis   [] Wheeze  [] COPD   [] Asthma Neurologic:  [] Dizziness   [] Seizures   [] History of stroke   [] History of TIA  [] Aphasia   [] Vissual changes   [] Weakness or numbness in arm   [] Weakness or numbness in leg Musculoskeletal:   [] Joint swelling   [] Joint pain   [] Low back pain Hematologic:  [] Easy bruising  [] Easy bleeding   [] Hypercoagulable state   [] Anemic Gastrointestinal:  [] Diarrhea   [] Vomiting  [] Gastroesophageal reflux/heartburn   [] Difficulty swallowing. Genitourinary:  [] Chronic kidney disease   [] Difficult urination  [] Frequent urination   [] Blood in urine Skin:  [] Rashes   [] Ulcers  Psychological:  [] History of anxiety   []  History of major depression.  Physical Examination  Vitals:   02/08/19 0905  BP: (!) 95/58  Pulse: 68  Resp: 16  Weight: 164 lb 12.8 oz (74.8 kg)  Height: 5\' 7"  (1.702 m)   Body mass index is 25.81 kg/m. Gen: WD/WN, NAD Head: [place]/AT, No temporalis wasting.  Ear/Nose/Throat: Hearing grossly intact, nares w/o erythema or drainage Eyes: PER, EOMI, sclera nonicteric.  Neck: Supple, no large masses.   Pulmonary:  Good air movement, no audible wheezing bilaterally, no use of accessory muscles.  Cardiac: RRR, no JVD Vascular: popliteal impulses are normal Vessel Right Left  Radial Palpable Palpable  PT Palpable Palpable  DP Palpable Palpable  Gastrointestinal: Non-distended. No guarding/no peritoneal signs.  Musculoskeletal: M/S 5/5 throughout.  No deformity or atrophy.  Neurologic: CN 2-12 intact. Symmetrical.  Speech is fluent. Motor exam as listed  above. Psychiatric: Judgment intact, Mood & affect appropriate for pt's clinical situation. Dermatologic: No rashes or ulcers noted.  No changes consistent with cellulitis. Lymph : No lichenification or skin changes of chronic lymphedema.  CBC Lab Results  Component Value Date   WBC 5.3 01/30/2019   HGB 12.6 (L) 01/30/2019   HCT 38.5 (L) 01/30/2019   MCV 97.0 01/30/2019   PLT 164 01/30/2019    BMET    Component Value Date/Time   NA 138 01/30/2019 1140   NA 141 02/09/2018  K 4.3 01/30/2019 1140   CL 104 01/30/2019 1140   CO2 27 01/30/2019 1140   GLUCOSE 103 (H) 01/30/2019 1140   BUN 19 01/30/2019 1140   BUN 19 02/09/2018   CREATININE 0.78 01/30/2019 1140   CREATININE 0.99 09/01/2018 1451   CALCIUM 9.0 01/30/2019 1140   GFRNONAA >60 01/30/2019 1140   GFRNONAA 67 09/01/2018 1451   GFRAA >60 01/30/2019 1140   GFRAA 78 09/01/2018 1451   Estimated Creatinine Clearance: 58.5 mL/min (by C-G formula based on SCr of 0.78 mg/dL).  COAG No results found for: INR, PROTIME  Radiology No results found.    Assessment/Plan 1. AAA (abdominal aortic aneurysm) without rupture (HCC) No surgery or intervention at this time. The patient has an asymptomatic abdominal aortic aneurysm that is less than 4 cm in maximal diameter.  I have discussed the natural history of abdominal aortic aneurysm and the small risk of rupture for aneurysm less than 5 cm in size.  However, as these small aneurysms tend to enlarge over time, continued surveillance with ultrasound or CT scan is mandatory.  I have also discussed optimizing medical management with hypertension and lipid control and the importance of abstinence from tobacco.  The patient is also encouraged to exercise a minimum of 30 minutes 4 times a week.  Should the patient develop new onset abdominal or back pain or signs of peripheral embolization they are instructed to seek medical attention immediately and to alert the physician providing care  that they have an aneurysm.  The patient voices their understanding. The patient will return in 12 months with an aortic duplex. - VAS US AORTA/IVC/ILIACS; Future  2. PAD (peripheral artery disease) (HCC) Recommend:  The patient has evidence of atherosclerosis of the lower extremities with claudication.  The patient does not voice lifestyle limiting changes at this point in time.  Noninvasive studies do not suggest clinically significant change.  No invasive studies, angiography or surgery at this time The patient should continue walking and begin a more formal exercise program.  The patient should continue antiplatelet therapy and aggressive treatment of the lipid abnormalities  No changes in the patient's medications at this time  The patient should continue wearing graduated compression socks 10-15 mmHg strength to control the mild edema.   3. Hypertension, benign Continue antihypertensive medications as already ordered, these medications have been reviewed and there are no changes at this time.   4. Dyslipidemia Continue statin as ordered and reviewed, no changes at this time    Hortencia Pilar, MD  02/08/2019 9:26 AM

## 2019-02-09 ENCOUNTER — Other Ambulatory Visit: Payer: Self-pay | Admitting: Family Medicine

## 2019-02-09 DIAGNOSIS — E785 Hyperlipidemia, unspecified: Secondary | ICD-10-CM

## 2019-02-16 ENCOUNTER — Other Ambulatory Visit: Payer: Self-pay | Admitting: Family Medicine

## 2019-02-16 DIAGNOSIS — R4189 Other symptoms and signs involving cognitive functions and awareness: Secondary | ICD-10-CM

## 2019-04-03 IMAGING — CT CT L SPINE W/O CM
3 series · 9 of 33 positions shown, 11 images · non-contrast
Comparison: Thoracic spine CT today reported separately. Skeletal
survey 02/24/2018.

CLINICAL DATA: 89-year-old male status post fall this morning
striking left rib area. Inspiratory pain, back pain.

EXAM:
CT LUMBAR SPINE WITHOUT CONTRAST
TECHNIQUE: Multidetector CT imaging of the lumbar spine was performed without
intravenous contrast administration. Multiplanar CT image
reconstructions were also generated.

[Series 5: l spine soft · axial · 0.33mm/px · z∈[-537,-537]mm · 1 of 113 slices shown, 2 images]
[im 61/113  soft-tissue]
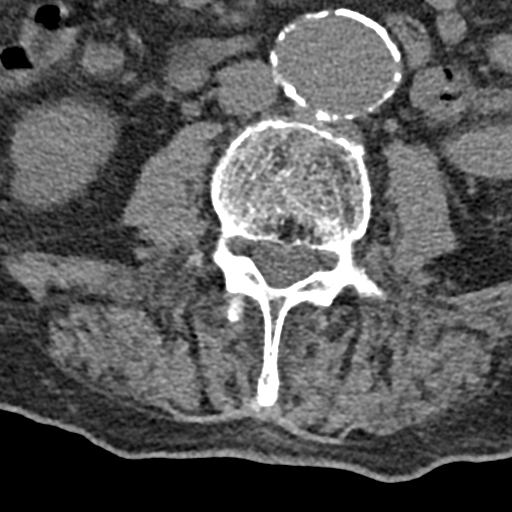
[im 61/113  bone]
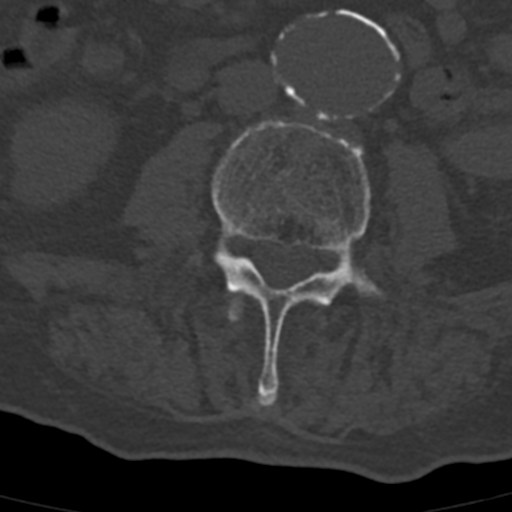

[Series 6: sagittal bone · sagittal · 0.26mm/px · 5 of 50 slices shown, 6 images]
[im 17/50  bone]
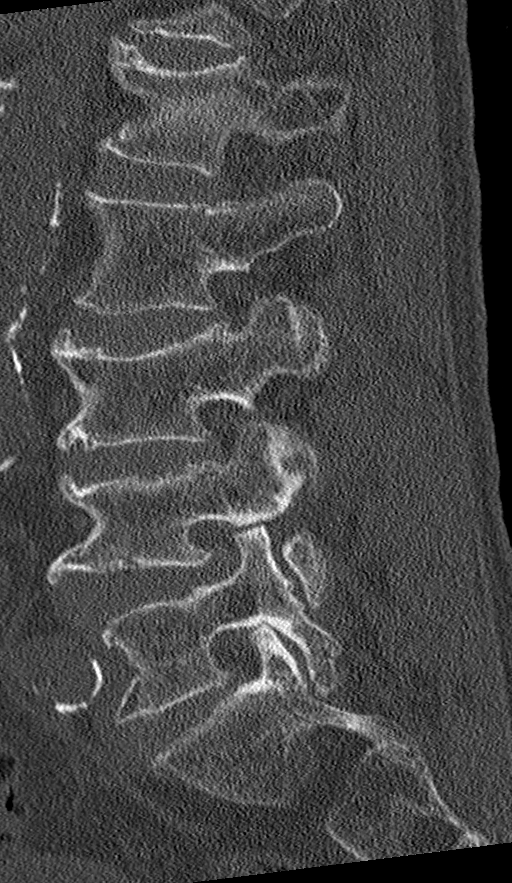
[im 21/50  bone]
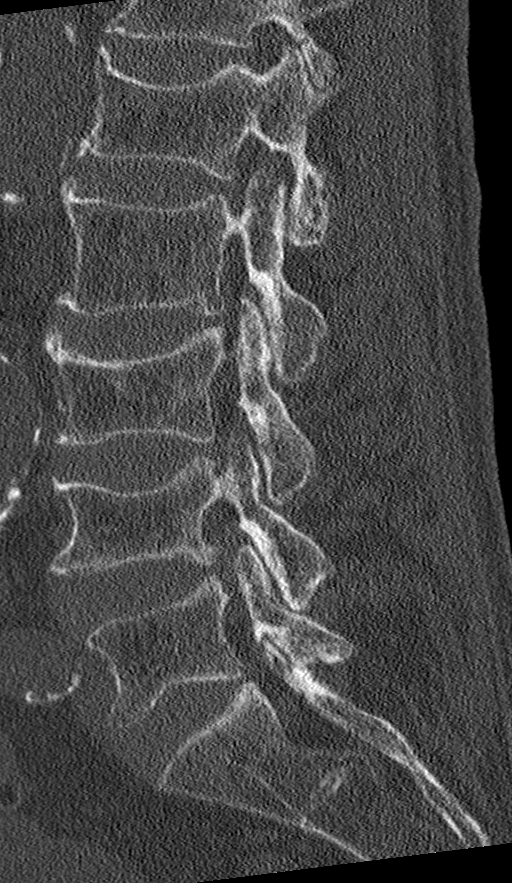
[im 25/50  soft-tissue]
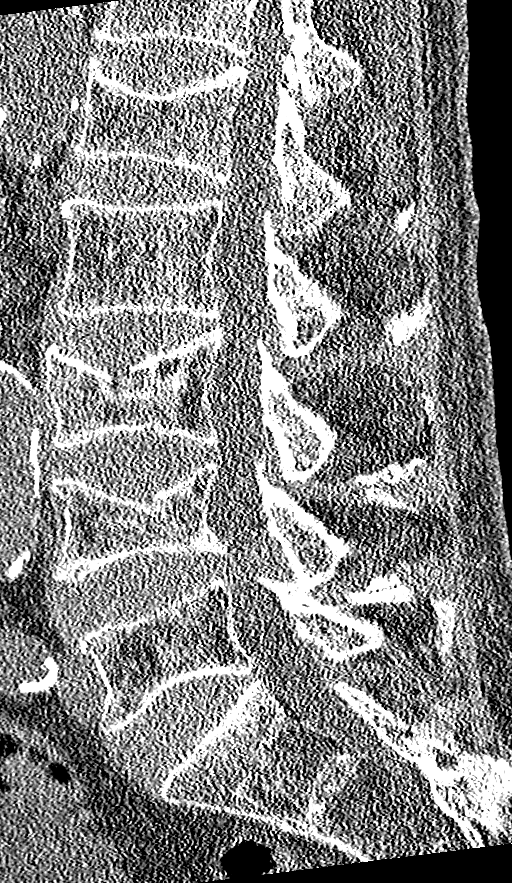
[im 25/50  bone]
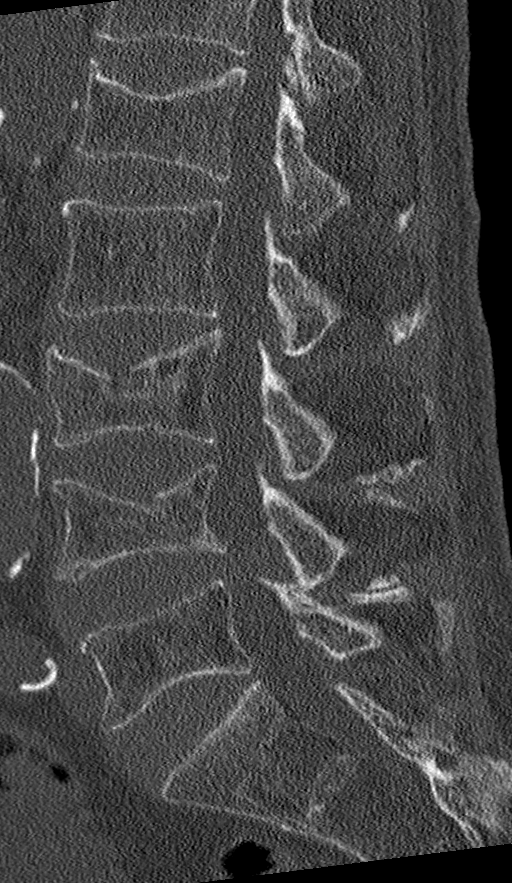
[im 29/50  bone]
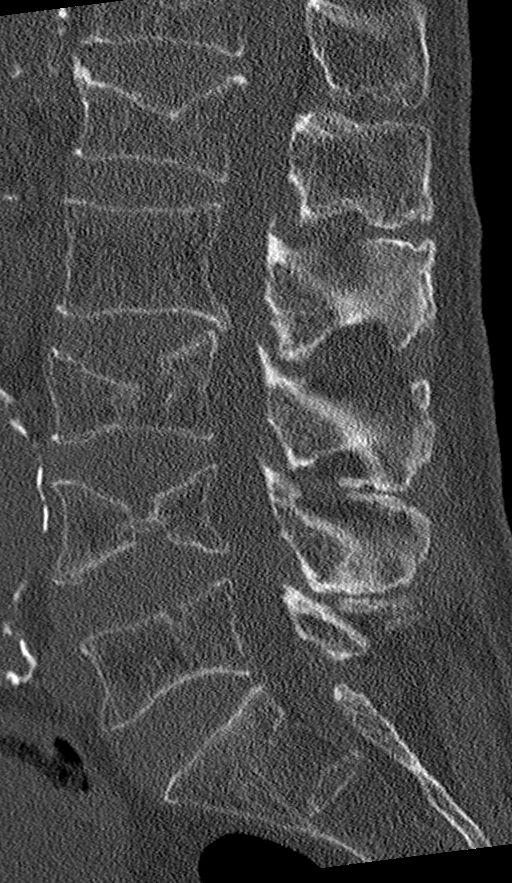
[im 33/50  bone]
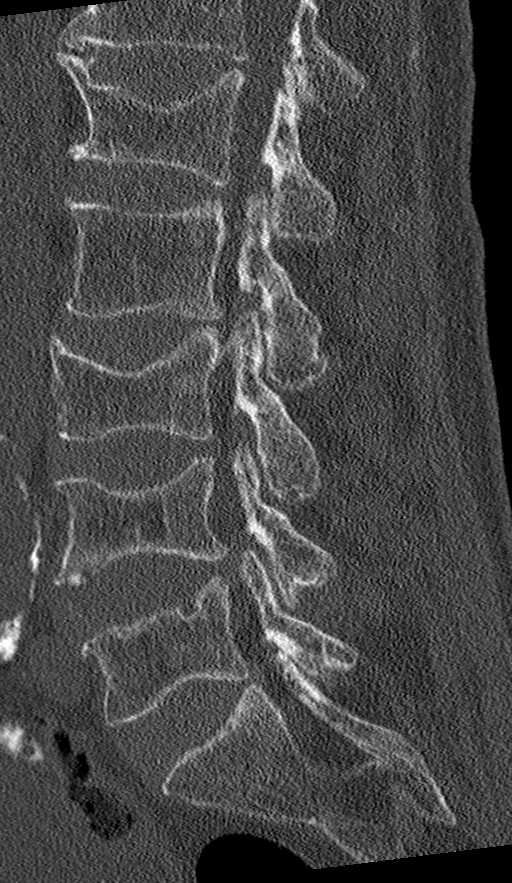

[Series 7: coronal bone · coronal · 0.24mm/px · 3 of 55 slices shown]
[im 11/55  bone]
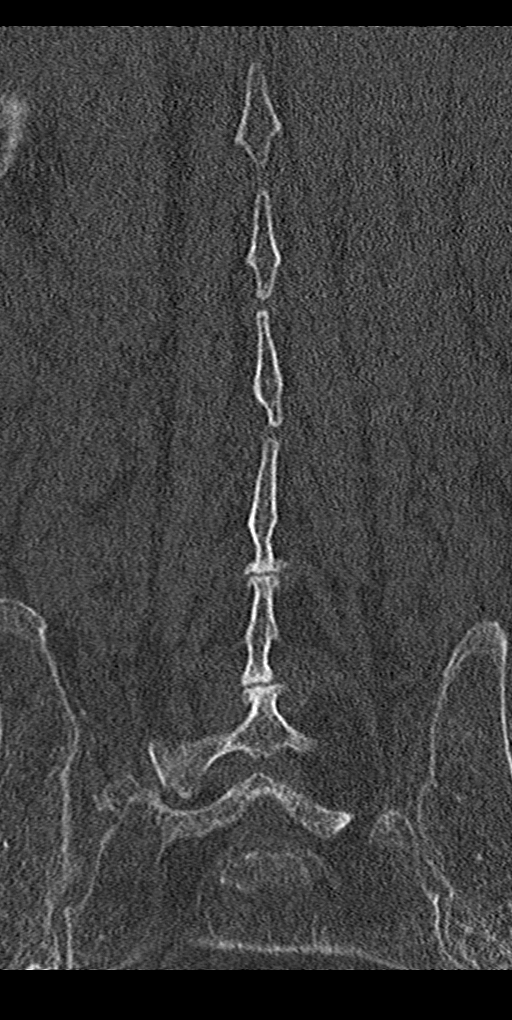
[im 22/55  bone]
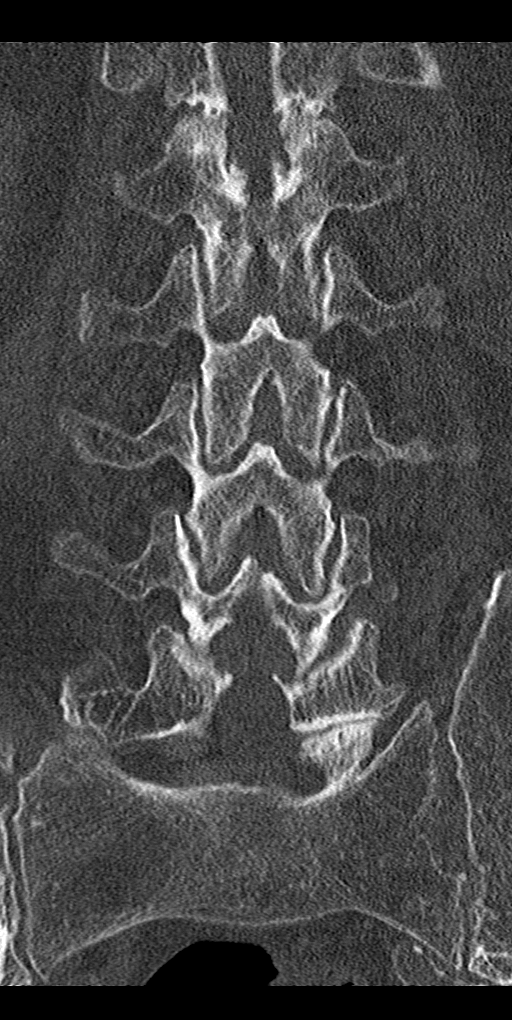
[im 33/55  bone]
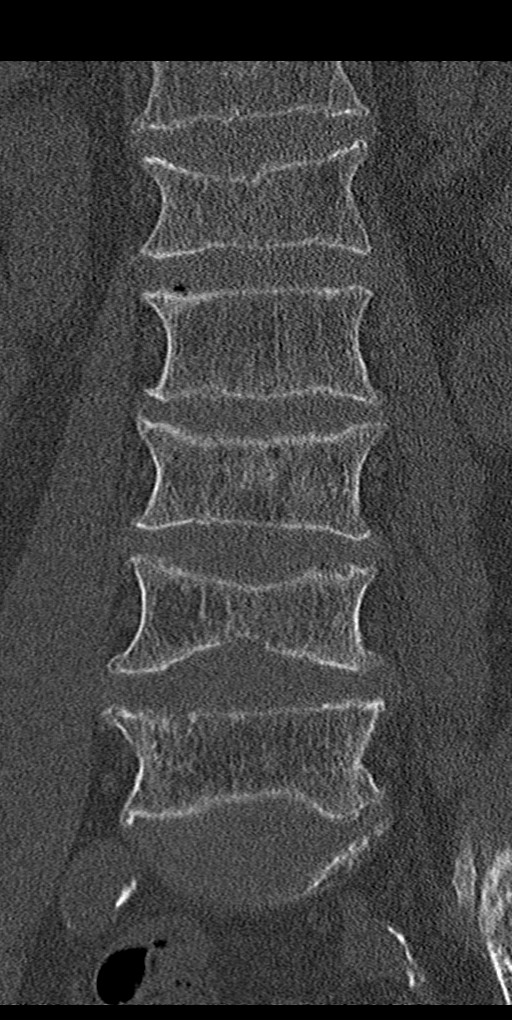

[9 of 33 positions shown; findings below may reference images not displayed]

FINDINGS: Segmentation: Normal, concordant with the thoracic spine numbering
today.

Alignment: Stable straightening of lumbar lordosis.

Vertebrae: Osteopenia. Chronic L1 compression fracture is stable.
Mild to moderate L3 and L4 central vertebral compression appears in
part related to bulky Schmorl's nodes, and has not definitely
changed since the prior skeletal survey. Mild inferior L5 endplate
compression fracture also appears stable. There is a gas containing
small L2 superior endplate Schmorl's node posteriorly on the right,
but L2 otherwise appears intact.

Visible sacrum and SI joints appear intact. No acute osseous
abnormality identified.

Paraspinal and other soft tissues: Infrarenal abdominal aortic
aneurysm with calcified atherosclerosis r is partially visible but
measures up to 33 millimeters diameter (when measured along the axis
of the vessel, series 7, image 2) versus 44 millimeters diameter
when measured obliquely on the axial images). Ectatic visible iliac
arteries. Vascular patency is not evaluated in the absence of IV
contrast. Negative visible abdominal viscera. Partially visible
urinary bladder distension. Paraspinal soft tissues are within
normal limits.

Disc levels:

Generally mild for age superimposed disc and posterior element
degeneration.

There is mild multifactorial spinal stenosis at L2-L3 and L3-L4.

At L4-L5 there is at least moderate and possibly severe spinal
stenosis related to disc bulging with severe facet and ligament
flavum hypertrophy (series 5, image 76).
IMPRESSION: 1. Osteopenia with lumbar compression fractures at every level but
L2, grossly stable from the 4614 skeletal survey.
If there is continued suspicion of occult osseous injury then Lumbar
MRI or Nuclear Medicine Whole-body Bone Scan would evaluate with the
highest sensitivity.
2. Calcified infrarenal abdominal aortic aneurysm, approximately 33
millimeters diameter. Recommend followup by Ultrasound in 3 years.
This recommendation follows ACR consensus guidelines: White Paper of
the ACR Incidental Findings Committee II on Vascular Findings. [HOSPITAL] 0105; [DATE]
3. Moderate to severe degenerative lumbar spinal stenosis at L4-L5.
Mild lumbar spinal stenosis elsewhere.

## 2019-04-12 ENCOUNTER — Other Ambulatory Visit: Payer: Self-pay

## 2019-04-12 ENCOUNTER — Ambulatory Visit (INDEPENDENT_AMBULATORY_CARE_PROVIDER_SITE_OTHER): Payer: Medicare HMO | Admitting: Family Medicine

## 2019-04-12 ENCOUNTER — Encounter: Payer: Self-pay | Admitting: Family Medicine

## 2019-04-12 VITALS — BP 124/80 | Temp 97.3°F | Resp 16 | Ht 67.0 in | Wt 168.5 lb

## 2019-04-12 DIAGNOSIS — Z8639 Personal history of other endocrine, nutritional and metabolic disease: Secondary | ICD-10-CM

## 2019-04-12 DIAGNOSIS — E785 Hyperlipidemia, unspecified: Secondary | ICD-10-CM | POA: Diagnosis not present

## 2019-04-12 DIAGNOSIS — I714 Abdominal aortic aneurysm, without rupture, unspecified: Secondary | ICD-10-CM

## 2019-04-12 DIAGNOSIS — E538 Deficiency of other specified B group vitamins: Secondary | ICD-10-CM

## 2019-04-12 DIAGNOSIS — R4189 Other symptoms and signs involving cognitive functions and awareness: Secondary | ICD-10-CM

## 2019-04-12 DIAGNOSIS — J3089 Other allergic rhinitis: Secondary | ICD-10-CM | POA: Diagnosis not present

## 2019-04-12 DIAGNOSIS — R803 Bence Jones proteinuria: Secondary | ICD-10-CM | POA: Diagnosis not present

## 2019-04-12 DIAGNOSIS — I739 Peripheral vascular disease, unspecified: Secondary | ICD-10-CM | POA: Diagnosis not present

## 2019-04-12 DIAGNOSIS — Z23 Encounter for immunization: Secondary | ICD-10-CM | POA: Diagnosis not present

## 2019-04-12 DIAGNOSIS — J302 Other seasonal allergic rhinitis: Secondary | ICD-10-CM

## 2019-04-12 DIAGNOSIS — D692 Other nonthrombocytopenic purpura: Secondary | ICD-10-CM

## 2019-04-12 LAB — POCT GLYCOSYLATED HEMOGLOBIN (HGB A1C): Hemoglobin A1C: 5.1 % (ref 4.0–5.6)

## 2019-04-12 MED ORDER — FLUTICASONE PROPIONATE 50 MCG/ACT NA SUSP: 2.0000 | Freq: Every day | NASAL | 1 refills | Status: DC

## 2019-04-12 MED ORDER — SIMVASTATIN 5 MG PO TABS: 5.0000 mg | ORAL_TABLET | Freq: Every day | ORAL | 1 refills | Status: DC

## 2019-04-12 NOTE — Progress Notes (Signed)
Name: Roy Hernandez   MRN: [id number]    DOB: [birth year]   Date:04/12/2019       Progress Note  Subjective  Chief Complaint  Chief Complaint  Patient presents with  . Diabetes  . Dyslipidemia    HPI   He came in today with his daughter  Urinary retention: on CT, over the past year has urgency but inability to void, urinary frequency, hesitancy, no blood in urinechills. He is seeing Urologist and is taking Flomax and also Finasteride and is doing better   DMII: fsbs not being checked at home, not on medications, he denies polyphagia, polydipsia or polyuria. He is on statin therapy, we will remove diagnosis of DM since A1C has been below 6 for over one year   MGUS: going on to see Dr. Janese Banks every 6 months with lab work after 3 months , anemia is stable, he denies SOB  Cognitive dysfunction : taking Aricept, progressing, daughters helps taking care of his money, doctor's visit, they also prepare his food. No longer using home health nurse because it took so long for them to go check on him. Still stays at home alone. He drives rarely a few times this year, only drives 5 miles to his girlfriends house. Social isolated since [place], two neighbors, girlfriend and two daughter and one son.   Balance problems; he has PAD, using a cane, likely multifactorial, he completed PT and is still doing his home exercises occasionally.   Senile Purpura: stable on both arms. Unchanged   PVD and AAA: he sees Dr. Erven Colla yearly , not on aspirin because of risk of fall, taking statin therapy and no side effects   AR: he has some nasal congestion and rhinorrhea and would like to have fluticasone  Patient Active Problem List   Diagnosis Date Noted  . Dyslipidemia associated with type 2 diabetes mellitus ([place]) 09/01/2018  . Senile purpura ([place]) 09/01/2018  . Neuropathy 06/16/2018  . 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 Left bundle-branch block 02/13/2018  . History of right inguinal hernia repair  12/16/2017  . Epiretinal membrane (ERM) of right eye 10/11/2017  . DJD (degenerative joint disease) 10/04/2017  . Hyperglycemia 10/04/2017  . Advanced stage glaucoma 10/04/2017  . Hypertension, benign 07/06/2017  . PAD (peripheral artery disease) ([place]) 10/19/2016  . Osteoporosis of femur without pathological fracture 11/25/2015  . AAA (abdominal aortic aneurysm) without rupture ([place]) 11/05/2014  . Abnormal ECG 11/05/2014  . At risk for falling 11/05/2014  . DD (diverticular disease) 11/05/2014  . Dyslipidemia 11/05/2014  . Failure of erection 11/05/2014  . Degenerative arthritis of lumbar spine 11/05/2014  . Compressed spine fracture ([place]) 11/05/2014  . Basal cell papilloma 11/05/2014  . Vitamin D deficiency 11/05/2014    Past Surgical History:  Procedure Laterality Date  . CATARACT EXTRACTION Bilateral   . HERNIA REPAIR  June 2019  . INGUINAL HERNIA REPAIR Right 01/27/2018   Medium Ultra Pro mesh.  Surgeon: Robert Bellow, MD;  Location: [facility] ORS;  Service: General;  Laterality: Right;  with mesh  . TOOTH EXTRACTION      Family History  Problem Relation Age of Onset  . Cancer Mother   . Hypertension Mother   . Diabetes Father   . Hyperlipidemia Father   . Diabetes Maternal Grandmother   . Diabetes Brother   . Kidney disease Brother        on dialysis  . Diabetes Sister   . Kidney disease Sister  dialysis  . Cancer Brother        esophageal  . Leukemia Sister     Social History   Socioeconomic History  . Marital status: Widowed    Spouse name: [name]  . Number of children: 3  . Years of education: Not on file  . Highest education level: 9th grade  Occupational History    Employer: [employer]IRED [employer]  Social Needs  . Financial resource strain: Not hard at all  . Food insecurity    Worry: Never true    Inability: Never true  . Transportation needs    Medical: No    Non-medical: No  Tobacco Use  . Smoking status: Former Smoker    Packs/day: 1.00     Years: 34.00    Pack years: 34.00    Types: Cigarettes    Start date: 08/02/1945    Quit date: 05/26/1980    Years since quitting: 38.9  . Smokeless tobacco: Never Used  . Tobacco comment: smoking cessation materials not required  Substance and Sexual Activity  . Alcohol use: No    Alcohol/week: 0.0 standard drinks  . Drug use: No  . Sexual activity: Not Currently  Lifestyle  . Physical activity    Days per week: 3 days    Minutes per session: 20 min  . Stress: Not at all  Relationships  . Social connections    Talks on phone: More than three times a week    Gets together: Twice a week    Attends religious service: More than 4 times per year    Active member of club or organization: No    Attends meetings of clubs or organizations: Never    Relationship status: Widowed  . Intimate partner violence    Fear of current or ex partner: No    Emotionally abused: No    Physically abused: No    Forced sexual activity: No  Other Topics Concern  . Not on file  Social History Narrative  . Not on file     Current Outpatient Medications:  .  acetaminophen (TYLENOL) 500 MG tablet, Take 500 mg by mouth at bedtime., Disp: , Rfl:  .  alendronate (FOSAMAX) 70 MG tablet, Take 1 tablet (70 mg total) by mouth every Monday. Take with a full glass of water on an empty stomach., Disp: 12 tablet, Rfl: 3 .  aspirin EC 81 MG tablet, Take 81 mg by mouth at bedtime., Disp: , Rfl:  .  Carboxymethylcellulose Sodium (LUBRICANT EYE DROPS PF OP), Place 1 drop into both eyes 3 (three) times daily as needed (for dry eyes.). , Disp: , Rfl:  .  cholecalciferol (VITAMIN D) 1000 UNITS tablet, Take 1,000 Units by mouth daily. , Disp: , Rfl:  .  donepezil (ARICEPT) 10 MG tablet, TAKE 1 TABLET (10 MG TOTAL) BY MOUTH AT BEDTIME., Disp: 90 tablet, Rfl: 1 .  finasteride (PROSCAR) 5 MG tablet, Take 1 tablet (5 mg total) by mouth daily., Disp: 90 tablet, Rfl: 3 .  fluticasone (FLONASE) 50 MCG/ACT nasal spray, Place 2  sprays into both nostrils daily., Disp: 48 g, Rfl: 1 .  latanoprost (XALATAN) 0.005 % ophthalmic solution, Place 1 drop into both eyes at bedtime. , Disp: , Rfl:  .  Menthol-Methyl Salicylate (MUSCLE RUB EX), Apply 1 application topically 4 (four) times daily as needed (for pain.). , Disp: , Rfl:  .  simvastatin (ZOCOR) 5 MG tablet, TAKE 1 TABLET (5 MG TOTAL) BY MOUTH AT BEDTIME., Disp: 90  tablet, Rfl: 0 .  tamsulosin (FLOMAX) 0.4 MG CAPS capsule, Take 1 capsule (0.4 mg total) by mouth daily., Disp: 90 capsule, Rfl: 3 .  timolol (TIMOPTIC) 0.5 % ophthalmic solution, Place 1 drop into both eyes daily. , Disp: , Rfl:  .  vitamin B-12 (CYANOCOBALAMIN) 1000 MCG tablet, Take 1,000 mcg by mouth daily. , Disp: , Rfl:  .  ONE TOUCH ULTRA TEST test strip, TEST BLOOD GLUCOSE LEVEL ONCE A DAY., Disp: 58 each, Rfl: 1  No Known Allergies  I personally reviewed active problem list, medication list, allergies, family history, social history, health maintenance with the patient/caregiver today.   ROS  Constitutional: Negative for fever or weight change.  Respiratory: Negative for cough and shortness of breath.   Cardiovascular: Negative for chest pain or palpitations.  Gastrointestinal: Negative for abdominal pain, no bowel changes.  Musculoskeletal: Negative for gait problem or joint swelling.  Skin: Negative for rash.  Neurological: Negative for dizziness or headache.  No other specific complaints in a complete review of systems (except as listed in HPI above).  Objective  Vitals:   04/12/19 0952  BP: 124/80  Resp: 16  Temp: (!) 97.3 F (36.3 C)  TempSrc: Temporal  Weight: 168 lb 8 oz (76.4 kg)  Height: 5' 7" (1.702 m)    Body mass index is 26.39 kg/m.  Physical Exam  Constitutional: Patient appears well-developed and well-nourished. No distress.  HEENT: head atraumatic, normocephalic, pupils equal and reactive to light Cardiovascular: Normal rate, regular rhythm and normal heart  sounds.  No murmur heard. No BLE edema. Pulmonary/Chest: Effort normal and breath sounds normal. No respiratory distress. Abdominal: Soft.  There is no tenderness. Skin: senile purpura on arms Psychiatric: Patient has a normal mood and affect. behavior is normal. Judgment and thought content normal.  Recent Results (from the past 2160 hour(s))  Kappa/lambda light chains     Status: Abnormal   Collection Time: 01/30/19 11:40 AM  Result Value Ref Range   Kappa free light chain 20.5 (H) 3.3 - 19.4 mg/L   Lamda free light chains 68.0 (H) 5.7 - 26.3 mg/L   Kappa, lamda light chain ratio 0.30 0.26 - 1.65    Comment: (NOTE) Performed At: [facility] [place], [place] [id number] Rush Farmer MD YK:[id number]   Multiple Myeloma Panel (SPEP&IFE w/QIG)     Status: Abnormal   Collection Time: 01/30/19 11:40 AM  Result Value Ref Range   IgG (Immunoglobin G), Serum 651 603 - 1,613 mg/dL   IgA 1,391 (H) 61 - 437 mg/dL    Comment: (NOTE) Results confirmed on dilution.    IgM (Immunoglobulin M), Srm 10 (L) 15 - 143 mg/dL    Comment: Result confirmed on concentration.   Total Protein ELP 6.2 6.0 - 8.5 g/dL   Albumin SerPl Elph-Mcnc 3.5 2.9 - 4.4 g/dL   Alpha 1 0.2 0.0 - 0.4 g/dL   Alpha2 Glob SerPl Elph-Mcnc 0.6 0.4 - 1.0 g/dL   B-Globulin SerPl Elph-Mcnc 1.4 (H) 0.7 - 1.3 g/dL   Gamma Glob SerPl Elph-Mcnc 0.6 0.4 - 1.8 g/dL   M Protein SerPl Elph-Mcnc 0.9 (H) Not Observed g/dL   Globulin, Total 2.7 2.2 - 3.9 g/dL   Albumin/Glob SerPl 1.3 0.7 - 1.7   IFE 1 Comment     Comment: (NOTE) Immunofixation shows IgA monoclonal protein with lambda light chain specificity.    Please Note Comment     Comment: (NOTE) Protein electrophoresis scan will follow via computer, mail,  or courier delivery. Performed At: [facility] [place], [place] [id number] Rush Farmer MD UT:[id number]   Comprehensive metabolic panel     Status: Abnormal   Collection  Time: 01/30/19 11:40 AM  Result Value Ref Range   Sodium 138 135 - 145 mmol/L   Potassium 4.3 3.5 - 5.1 mmol/L   Chloride 104 98 - 111 mmol/L   CO2 27 22 - 32 mmol/L   Glucose, Bld 103 (H) 70 - 99 mg/dL   BUN 19 8 - 23 mg/dL   Creatinine, Ser 0.78 0.61 - 1.24 mg/dL   Calcium 9.0 8.9 - 10.3 mg/dL   Total Protein 7.0 6.5 - 8.1 g/dL   Albumin 3.7 3.5 - 5.0 g/dL   AST 15 15 - 41 U/L   ALT 10 0 - 44 U/L   Alkaline Phosphatase 117 38 - 126 U/L   Total Bilirubin 0.6 0.3 - 1.2 mg/dL   GFR calc non Af Amer >60 >60 mL/min   GFR calc Af Amer >60 >60 mL/min   Anion gap 7 5 - 15    Comment: Performed at [facility], [address] [place], [place] [address]  CBC with Differential/Platelet     Status: Abnormal   Collection Time: 01/30/19 11:40 AM  Result Value Ref Range   WBC 5.3 4.0 - 10.5 K/uL   RBC 3.97 (L) 4.22 - 5.81 MIL/uL   Hemoglobin 12.6 (L) 13.0 - 17.0 g/dL   HCT 38.5 (L) 39.0 - 52.0 %   MCV 97.0 80.0 - 100.0 fL   MCH 31.7 26.0 - 34.0 pg   MCHC 32.7 30.0 - 36.0 g/dL   RDW 12.5 11.5 - 15.5 %   Platelets 164 150 - 400 K/uL   nRBC 0.0 0.0 - 0.2 %   Neutrophils Relative % 67 %   Neutro Abs 3.6 1.7 - 7.7 K/uL   Lymphocytes Relative 20 %   Lymphs Abs 1.1 0.7 - 4.0 K/uL   Monocytes Relative 9 %   Monocytes Absolute 0.5 0.1 - 1.0 K/uL   Eosinophils Relative 3 %   Eosinophils Absolute 0.2 0.0 - 0.5 K/uL   Basophils Relative 1 %   Basophils Absolute 0.0 0.0 - 0.1 K/uL   Immature Granulocytes 0 %   Abs Immature Granulocytes 0.01 0.00 - 0.07 K/uL    Comment: Performed at [facility], [place], [place] [address]  BLADDER SCAN AMB NON-IMAGING     Status: None   Collection Time: 02/01/19 10:30 AM  Result Value Ref Range   Scan Result 264m      PH[facility]2/9: [facility] 04/12/2019 10/10/2018 10/10/2018 06/16/2018 02/13/2018  Decreased Interest 0 0 0 0 0  Down, Depressed, Hopeless 0 0 0 0 0  PHQ - 2 Score 0 0 0 0 0  Altered sleeping 0 0 - 1 -   Tired, decreased energy 0 0 - 1 -  Change in appetite 0 0 - 0 -  Feeling bad or failure about yourself  0 0 - 0 -  Trouble concentrating 0 1 - 0 -  Moving slowly or fidgety/restless 0 0 - 0 -  Suicidal thoughts 0 0 - 0 -  PHQ-9 Score 0 1 - 2 -  Difficult doing work/chores - Not difficult at all - Not difficult at all -    phq 9 is negative   Fall Risk: Fall Risk  10/10/2018 10/10/2018 09/01/2018 06/16/2018 02/13/2018  Falls in the past year?  0 1 1 0 No  Number falls in past yr: 0 1 1 - -  Injury with Fall? 0 1 1 - -  Risk Factor Category  - - - - -  Risk for fall due to : - History of fall(s);Impaired balance/gait Impaired balance/gait;History of fall(s) - -  Risk for fall due to: Comment - - - - -  Follow up - Falls prevention discussed - - -     Functional Status Survey: Is the patient deaf or have difficulty hearing?: No Does the patient have difficulty seeing, even when wearing glasses/contacts?: No Does the patient have difficulty concentrating, remembering, or making decisions?: Yes Does the patient have difficulty walking or climbing stairs?: Yes Does the patient have difficulty dressing or bathing?: Yes Does the patient have difficulty doing errands alone such as visiting a doctor's office or shopping?: No    Assessment & Plan   1. History of diabetes mellitus  - POCT HgB A1C  2. Need for immunization against influenza  - Flu Vaccine QUAD High Dose(Fluad)  3. Dyslipidemia  - simvastatin (ZOCOR) 5 MG tablet; Take 1 tablet (5 mg total) by mouth at bedtime.  Dispense: 90 tablet; Refill: 1 - Lipid panel  4. Senile purpura (HCC)  stable  5. Persistent cognitive impairment  On aricept   6. PAD (peripheral artery disease) (HCC)  Seeing Dr. Payton Mccallum   7. B12 deficiency  Taking otc supplementation   8. Bence Jones proteinuria   9. Perennial allergic rhinitis with seasonal variation  - fluticasone (FLONASE) 50 MCG/ACT nasal spray; Place 2 sprays into  both nostrils daily.  Dispense: 48 g; Refill: 1  10. AAA (abdominal aortic aneurysm) without rupture (HCC)  On statin therapy

## 2019-04-13 LAB — LIPID PANEL
Cholesterol: 110 mg/dL (ref <200)
HDL: 45 mg/dL (ref >=40)
LDL Cholesterol (Calc): 50 mg/dL (calc)
Non-HDL Cholesterol (Calc): 65 mg/dL (calc) (ref <130)
Total CHOL/HDL Ratio: 2.4 (calc) (ref <5.0)
Triglycerides: 74 mg/dL (ref <150)

## 2019-04-30 DIAGNOSIS — H401133 Primary open-angle glaucoma, bilateral, severe stage: Secondary | ICD-10-CM | POA: Diagnosis not present

## 2019-05-24 DIAGNOSIS — H401133 Primary open-angle glaucoma, bilateral, severe stage: Secondary | ICD-10-CM | POA: Diagnosis not present

## 2019-05-27 ENCOUNTER — Encounter: Payer: Self-pay | Admitting: Family Medicine

## 2019-05-28 ENCOUNTER — Encounter: Payer: Self-pay | Admitting: Family Medicine

## 2019-05-30 ENCOUNTER — Encounter: Payer: Self-pay | Admitting: Family Medicine

## 2019-05-30 ENCOUNTER — Ambulatory Visit (INDEPENDENT_AMBULATORY_CARE_PROVIDER_SITE_OTHER): Payer: Medicare HMO | Admitting: Family Medicine

## 2019-05-30 ENCOUNTER — Ambulatory Visit: Payer: Medicare HMO | Admitting: Family Medicine

## 2019-05-30 ENCOUNTER — Other Ambulatory Visit: Payer: Self-pay

## 2019-05-30 VITALS — BP 110/62 | HR 83 | Temp 98.1°F | Resp 14 | Ht 67.0 in | Wt 173.6 lb

## 2019-05-30 DIAGNOSIS — R21 Rash and other nonspecific skin eruption: Secondary | ICD-10-CM

## 2019-05-30 MED ORDER — CLOTRIMAZOLE-BETAMETHASONE 1-0.05 % EX CREA: 1.0000 "application " | TOPICAL_CREAM | Freq: Two times a day (BID) | CUTANEOUS | 0 refills | Status: DC

## 2019-05-30 NOTE — Patient Instructions (Signed)
My recommendations for the rash to bottom is to apply this antifungal and topical steroid to the area - I hope it will help flatten it and treat the thickened skin. As soon as it started to improve, decrease the cream to once a day, and stop the meds as soon as you can.  You can also start applying vaseline, desitin or other barrier creams or ointments to that area.  Please let me know as soon as possible if any worsening and I'll send home health out

## 2019-05-30 NOTE — Progress Notes (Signed)
Patient ID: Roy Hernandez, male    DOB: [birth year], 85 y.o.   MRN: [id number]  PCP: [name], MD  Chief Complaint  Patient presents with   bed sore    S[place] suggested home health referral to daughter, when she spoke with her?    Subjective:   Roy Hernandez is a 85 y.o. male, presents to clinic with his daughter, concerned today about a new rash or wound to right upper buttocks or sacral area.  Noticed this last week.  Pt states he can "just tell its there."  Patient's daughter has communicated with his PCP, Dr. Ancil Boozer about this a few times on my chart and she advised a office visit for possible home health. Patient states that he can feel it is there but it has not bothered him he has not had any itching or pain associated with it no drainage.  He does not believe that he is scratching himself at night or itching in his sleep.  Family members help him with some of his care but he does live alone and ambulates with a cane.  He denies any immobility he goes out walking several times a day back-and-forth to the mailbox, his nutrition is good he has not lost any weight.  Sometimes he will fall asleep on the couch or take naps but he is rarely in one position for long periods of time.  He has no other areas of rash.  He has never had any other similar rash or wounds.    Patient Active Problem List   Diagnosis Date Noted   Senile purpura ([place]) 09/01/2018   Neuropathy 06/16/2018   Bence Jones proteinuria 02/13/2018   Left bundle-branch block 02/13/2018   History of right inguinal hernia repair 12/16/2017   Epiretinal membrane (ERM) of right eye 10/11/2017   DJD (degenerative joint disease) 10/04/2017   Hyperglycemia 10/04/2017   Advanced stage glaucoma 10/04/2017   Hypertension, benign 07/06/2017   PAD (peripheral artery disease) ([place]) 10/19/2016   Osteoporosis of femur without pathological fracture 11/25/2015   AAA (abdominal aortic aneurysm) without rupture ([place])  11/05/2014   Abnormal ECG 11/05/2014   At risk for falling 11/05/2014   DD (diverticular disease) 11/05/2014   Dyslipidemia 11/05/2014   Failure of erection 11/05/2014   Degenerative arthritis of lumbar spine 11/05/2014   Compressed spine fracture ([place]) 11/05/2014   Basal cell papilloma 11/05/2014   Vitamin D deficiency 11/05/2014      Current Outpatient Medications:    acetaminophen (TYLENOL) 500 MG tablet, Take 500 mg by mouth at bedtime., Disp: , Rfl:    alendronate (FOSAMAX) 70 MG tablet, Take 1 tablet (70 mg total) by mouth every Monday. Take with a full glass of water on an empty stomach., Disp: 12 tablet, Rfl: 3   aspirin EC 81 MG tablet, Take 81 mg by mouth at bedtime., Disp: , Rfl:    Carboxymethylcellulose Sodium (LUBRICANT EYE DROPS PF OP), Place 1 drop into both eyes 3 (three) times daily as needed (for dry eyes.). , Disp: , Rfl:    cholecalciferol (VITAMIN D) 1000 UNITS tablet, Take 1,000 Units by mouth daily. , Disp: , Rfl:    donepezil (ARICEPT) 10 MG tablet, TAKE 1 TABLET (10 MG TOTAL) BY MOUTH AT BEDTIME., Disp: 90 tablet, Rfl: 1   finasteride (PROSCAR) 5 MG tablet, Take 1 tablet (5 mg total) by mouth daily., Disp: 90 tablet, Rfl: 3   fluticasone (FLONASE) 50 MCG/ACT nasal spray, Place 2 sprays into  both nostrils daily., Disp: 48 g, Rfl: 1   latanoprost (XALATAN) 0.005 % ophthalmic solution, Place 1 drop into both eyes at bedtime. , Disp: , Rfl:    Menthol-Methyl Salicylate (MUSCLE RUB EX), Apply 1 application topically 4 (four) times daily as needed (for pain.). , Disp: , Rfl:    simvastatin (ZOCOR) 5 MG tablet, Take 1 tablet (5 mg total) by mouth at bedtime., Disp: 90 tablet, Rfl: 1   tamsulosin (FLOMAX) 0.4 MG CAPS capsule, Take 1 capsule (0.4 mg total) by mouth daily., Disp: 90 capsule, Rfl: 3   timolol (TIMOPTIC) 0.5 % ophthalmic solution, Place 1 drop into both eyes daily. , Disp: , Rfl:    vitamin B-12 (CYANOCOBALAMIN) 1000 MCG tablet, Take  1,000 mcg by mouth daily. , Disp: , Rfl:    No Known Allergies   Family History  Problem Relation Age of Onset   Cancer Mother    Hypertension Mother    Diabetes Father    Hyperlipidemia Father    Diabetes Maternal Grandmother    Diabetes Brother    Kidney disease Brother        on dialysis   Diabetes Sister    Kidney disease Sister        dialysis   Cancer Brother        esophageal   Leukemia Sister      Social History   Socioeconomic History   Marital status: Widowed    Spouse name: [name]   Number of children: 3   Years of education: Not on file   Highest education level: 9th grade  Occupational History    Employer: [employer]IRED [employer]  Social Designer, fashion/clothing strain: Not hard at all   Food insecurity    Worry: Never true    Inability: Never true   Transportation needs    Medical: No    Non-medical: No  Tobacco Use   Smoking status: Former Smoker    Packs/day: 1.00    Years: 34.00    Pack years: 34.00    Types: Cigarettes    Start date: 08/02/1945    Quit date: 05/26/1980    Years since quitting: 39.0   Smokeless tobacco: Never Used   Tobacco comment: smoking cessation materials not required  Substance and Sexual Activity   Alcohol use: No    Alcohol/week: 0.0 standard drinks   Drug use: No   Sexual activity: Not Currently  Lifestyle   Physical activity    Days per week: 3 days    Minutes per session: 20 min   Stress: Not at all  Relationships   Social connections    Talks on phone: More than three times a week    Gets together: Twice a week    Attends religious service: More than 4 times per year    Active member of club or organization: No    Attends meetings of clubs or organizations: Never    Relationship status: Widowed   Intimate partner violence    Fear of current or ex partner: No    Emotionally abused: No    Physically abused: No    Forced sexual activity: No  Other Topics Concern   Not on file  Social  History Narrative   Not on file      Review of Systems  Constitutional: Negative.  Negative for activity change, appetite change, chills, diaphoresis, fatigue, fever and unexpected weight change.  HENT: Negative.   Eyes: Negative.   Respiratory: Negative.  Cardiovascular: Negative.   Gastrointestinal: Negative.   Endocrine: Negative.   Genitourinary: Negative.   Musculoskeletal: Negative.   Skin: Positive for rash and wound.  Allergic/Immunologic: Negative.   Neurological: Negative.   Hematological: Negative.   Psychiatric/Behavioral: Negative.   All other systems reviewed and are negative.      Objective:   Vitals:   05/30/19 1531  BP: 110/62  Pulse: 83  Resp: 14  Temp: 98.1 F (36.7 C)  SpO2: 95%  Weight: 173 lb 9.6 oz (78.7 kg)  Height: 5\' 7"  (1.702 m)    Body mass index is 27.19 kg/m.  Physical Exam Vitals signs and nursing note reviewed. Exam conducted with a chaperone present.  Constitutional:      Appearance: He is well-developed.     Comments: Well-appearing elderly male, walks with a cane is slightly hard of hearing, pleasant, no acute distress, nontoxic  HENT:     Head: Normocephalic and atraumatic.     Nose: Nose normal.     Mouth/Throat:     Mouth: Mucous membranes are moist.     Pharynx: Oropharynx is clear.  Eyes:     General:        Right eye: No discharge.        Left eye: No discharge.     Conjunctiva/sclera: Conjunctivae normal.  Neck:     Trachea: No tracheal deviation.  Cardiovascular:     Rate and Rhythm: Normal rate and regular rhythm.  Pulmonary:     Effort: Pulmonary effort is normal. No respiratory distress.     Breath sounds: Normal breath sounds. No stridor. No wheezing.  Musculoskeletal: Normal range of motion.  Skin:    General: Skin is warm and dry.     Findings: Rash present. No abscess or bruising. Rash is crusting and scaling. Rash is not papular, pustular or urticarial.          Comments: Left upper buttock/left  lower sacral area a 1x2cm plaque with some central scabbing, surrounding skin with some flaking and scaling.  Skin intact, no drainage, no ulceration.  No edema, induration, ttp.  Skin in his perianal and sacral area is slightly mor  Neurological:     Mental Status: He is alert. Mental status is at baseline.     Gait: Gait abnormal.  Psychiatric:        Behavior: Behavior normal.      Results for orders placed or performed in visit on 04/12/19  Lipid panel  Result Value Ref Range   Cholesterol 110 <200 mg/dL   HDL 45 > OR = 40 mg/dL   Triglycerides 74 <150 mg/dL   LDL Cholesterol (Calc) 50 mg/dL (calc)   Total CHOL/HDL Ratio 2.4 <5.0 (calc)   Non-HDL Cholesterol (Calc) 65 <130 mg/dL (calc)  POCT HgB A1C  Result Value Ref Range   Hemoglobin A1C 5.1 4.0 - 5.6 %   HbA1c POC (<> result, manual entry)     HbA1c, POC (prediabetic range)     HbA1c, POC (controlled diabetic range)          Assessment & Plan:      ICD-10-CM   1. Rash and nonspecific skin eruption  R21   Area of concern appears more like a raised plaque like area, slightly excoriated with some possible scabbing but no concerns for cellulitis, is not consistent with pressure ulcer.  Did not feel that home health for wound care was indicated at this time I suspect that some topical medicines for a  week or 2 will help improve this. Recommended trying very sparingly twice a day Lotrisone and covering with a nonadherent gauze and as soon as the thickened skin begins to flatten to decrease to once a day and start applying Vaseline.  When improve discontinue Lotrisone as soon as possible.  If Lotrisone causes any skin breakdown rash burning irritation etc. want him to stop immediately and let me know.  If at any time area appears infected or worsening or increasing in size to let me know and I will send out home health right away.      Delsa Grana, PA-C 05/30/19 3:45 PM

## 2019-05-30 NOTE — Progress Notes (Deleted)
Has sore on left upper buttock, red area

## 2019-06-21 ENCOUNTER — Encounter: Payer: Self-pay | Admitting: Family Medicine

## 2019-06-22 ENCOUNTER — Encounter: Payer: Self-pay | Admitting: Family Medicine

## 2019-06-29 ENCOUNTER — Other Ambulatory Visit: Payer: Self-pay | Admitting: Family Medicine

## 2019-06-29 DIAGNOSIS — M810 Age-related osteoporosis without current pathological fracture: Secondary | ICD-10-CM

## 2019-07-11 ENCOUNTER — Other Ambulatory Visit: Payer: Self-pay | Admitting: Family Medicine

## 2019-07-11 DIAGNOSIS — R4189 Other symptoms and signs involving cognitive functions and awareness: Secondary | ICD-10-CM

## 2019-08-10 ENCOUNTER — Other Ambulatory Visit: Payer: Medicare HMO

## 2019-08-10 ENCOUNTER — Ambulatory Visit: Payer: Medicare HMO | Admitting: Oncology

## 2019-08-13 ENCOUNTER — Emergency Department: Payer: Medicare HMO

## 2019-08-13 ENCOUNTER — Other Ambulatory Visit: Payer: Self-pay

## 2019-08-13 ENCOUNTER — Encounter: Payer: Self-pay | Admitting: Emergency Medicine

## 2019-08-13 ENCOUNTER — Emergency Department
Admission: EM | Admit: 2019-08-13 | Discharge: 2019-08-13 | Disposition: A | Discharge status: Home / Self Care | Payer: Medicare HMO | Attending: Emergency Medicine | Admitting: Emergency Medicine

## 2019-08-13 DIAGNOSIS — W19XXXA Unspecified fall, initial encounter: Secondary | ICD-10-CM

## 2019-08-13 DIAGNOSIS — E1151 Type 2 diabetes mellitus with diabetic peripheral angiopathy without gangrene: Secondary | ICD-10-CM | POA: Insufficient documentation

## 2019-08-13 DIAGNOSIS — F0390 Unspecified dementia without behavioral disturbance: Secondary | ICD-10-CM | POA: Insufficient documentation

## 2019-08-13 DIAGNOSIS — S0031XA Abrasion of nose, initial encounter: Secondary | ICD-10-CM | POA: Diagnosis not present

## 2019-08-13 DIAGNOSIS — S0990XA Unspecified injury of head, initial encounter: Secondary | ICD-10-CM | POA: Diagnosis not present

## 2019-08-13 DIAGNOSIS — Z79899 Other long term (current) drug therapy: Secondary | ICD-10-CM | POA: Diagnosis not present

## 2019-08-13 DIAGNOSIS — I10 Essential (primary) hypertension: Secondary | ICD-10-CM | POA: Insufficient documentation

## 2019-08-13 DIAGNOSIS — Z87891 Personal history of nicotine dependence: Secondary | ICD-10-CM | POA: Insufficient documentation

## 2019-08-13 DIAGNOSIS — E119 Type 2 diabetes mellitus without complications: Secondary | ICD-10-CM | POA: Insufficient documentation

## 2019-08-13 DIAGNOSIS — Y929 Unspecified place or not applicable: Secondary | ICD-10-CM | POA: Diagnosis not present

## 2019-08-13 DIAGNOSIS — I714 Abdominal aortic aneurysm, without rupture: Secondary | ICD-10-CM | POA: Diagnosis not present

## 2019-08-13 DIAGNOSIS — S199XXA Unspecified injury of neck, initial encounter: Secondary | ICD-10-CM | POA: Diagnosis not present

## 2019-08-13 DIAGNOSIS — Y9389 Activity, other specified: Secondary | ICD-10-CM | POA: Diagnosis not present

## 2019-08-13 DIAGNOSIS — Y999 Unspecified external cause status: Secondary | ICD-10-CM | POA: Diagnosis not present

## 2019-08-13 DIAGNOSIS — Z7982 Long term (current) use of aspirin: Secondary | ICD-10-CM | POA: Diagnosis not present

## 2019-08-13 DIAGNOSIS — S20311A Abrasion of right front wall of thorax, initial encounter: Secondary | ICD-10-CM | POA: Diagnosis not present

## 2019-08-13 DIAGNOSIS — W1830XA Fall on same level, unspecified, initial encounter: Secondary | ICD-10-CM | POA: Diagnosis not present

## 2019-08-13 DIAGNOSIS — R079 Chest pain, unspecified: Secondary | ICD-10-CM | POA: Diagnosis not present

## 2019-08-13 DIAGNOSIS — R0789 Other chest pain: Secondary | ICD-10-CM | POA: Diagnosis not present

## 2019-08-13 DIAGNOSIS — T148XXA Other injury of unspecified body region, initial encounter: Secondary | ICD-10-CM

## 2019-08-13 LAB — BASIC METABOLIC PANEL
Anion gap: 9 (ref 5–15)
BUN: 21 mg/dL (ref 8–23)
CO2: 26 mmol/L (ref 22–32)
Calcium: 9 mg/dL (ref 8.9–10.3)
Chloride: 104 mmol/L (ref 98–111)
Creatinine, Ser: 0.94 mg/dL (ref 0.61–1.24)
GFR calc Af Amer: >60 mL/min (ref >60)
GFR calc non Af Amer: >60 mL/min (ref >60)
Glucose, Bld: 135 mg/dL — ABNORMAL HIGH (ref 70–99)
Potassium: 3.8 mmol/L (ref 3.5–5.1)
Sodium: 139 mmol/L (ref 135–145)

## 2019-08-13 LAB — CBC
HCT: 38.1 % — ABNORMAL LOW (ref 39.0–52.0)
Hemoglobin: 12.8 g/dL — ABNORMAL LOW (ref 13.0–17.0)
MCH: 32.6 pg (ref 26.0–34.0)
MCHC: 33.6 g/dL (ref 30.0–36.0)
MCV: 96.9 fL (ref 80.0–100.0)
Platelets: 155 10*3/uL (ref 150–400)
RBC: 3.93 MIL/uL — ABNORMAL LOW (ref 4.22–5.81)
RDW: 12.3 % (ref 11.5–15.5)
WBC: 6.5 10*3/uL (ref 4.0–10.5)
nRBC: 0 % (ref 0.0–0.2)

## 2019-08-13 LAB — TROPONIN I (HIGH SENSITIVITY)
Troponin I (High Sensitivity): 24 ng/L — ABNORMAL HIGH (ref <18)
Troponin I (High Sensitivity): 24 ng/L — ABNORMAL HIGH (ref <18)

## 2019-08-13 MED ORDER — SODIUM CHLORIDE 0.9% FLUSH: 3.0000 mL | Freq: Once | INTRAVENOUS | Status: DC

## 2019-08-13 NOTE — ED Triage Notes (Signed)
[address] about 4 am when got up to go to bathroom. Chest pain began some time after. Patient remembers fall. bandage over nasal abrasion. Denies headache. Takes aspirin only as blood thinner.

## 2019-08-13 NOTE — ED Notes (Signed)
Pt and family verbalize understanding of d/c instructions, medications and follow up.

## 2019-08-13 NOTE — ED Provider Notes (Signed)
[facility] Emergency Department Provider Note  Time seen: 5:05 PM  I have reviewed the triage vital signs and the nursing notes.   HISTORY  Chief Complaint Chest Pain and Fall   HPI Roy Hernandez is a 86 y.o. male with a past medical history of dementia, diabetes, hypertension, hyperlipidemia,  presents to the emergency department after a fall around 4:00 this morning while trying to go to the bathroom.  Later in the day patient began experiencing some pain in his chest worse with movement.  Patient denies headache.  No blood thinners besides aspirin.  Patient is here with family member.  States the patient is acting normal.  Currently patient is awake alert, answering questions appropriately denies any discomfort.  Does have an abrasion to the nose.  Family member denies any recent fever cough congestion vomiting or diarrhea.  Past Medical History:  Diagnosis Date  . AAA (abdominal aortic aneurysm) ([place])   . Arthritis   . Cataract   . Dementia ([place])   . Diabetes mellitus without complication (HCC)    diet controlled  . Glaucoma   . Hyperkalemia   . Hyperlipidemia   . Hypertension   . Inguinal hernia of right side without obstruction or gangrene   . Osteoporosis   . Peripheral vascular disease ([place])   . Vitamin D deficiency     Patient Active Problem List   Diagnosis Date Noted  . Senile purpura ([place]) 09/01/2018  . Neuropathy 06/16/2018  . 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 Left bundle-branch block 02/13/2018  . History of right inguinal hernia repair 12/16/2017  . Epiretinal membrane (ERM) of right eye 10/11/2017  . DJD (degenerative joint disease) 10/04/2017  . Hyperglycemia 10/04/2017  . Advanced stage glaucoma 10/04/2017  . Hypertension, benign 07/06/2017  . PAD (peripheral artery disease) ([place]) 10/19/2016  . Osteoporosis of femur without pathological fracture 11/25/2015  . AAA (abdominal aortic aneurysm) without rupture ([place]) 11/05/2014   . Abnormal ECG 11/05/2014  . At risk for falling 11/05/2014  . DD (diverticular disease) 11/05/2014  . Dyslipidemia 11/05/2014  . Failure of erection 11/05/2014  . Degenerative arthritis of lumbar spine 11/05/2014  . Compressed spine fracture ([place]) 11/05/2014  . Basal cell papilloma 11/05/2014  . Vitamin D deficiency 11/05/2014    Past Surgical History:  Procedure Laterality Date  . CATARACT EXTRACTION Bilateral   . HERNIA REPAIR  June 2019  . INGUINAL HERNIA REPAIR Right 01/27/2018   Medium Ultra Pro mesh.  Surgeon: Robert Bellow, MD;  Location: [facility] ORS;  Service: General;  Laterality: Right;  with mesh  . TOOTH EXTRACTION      Prior to Admission medications   Medication Sig Start Date End Date Taking? Authorizing Provider  acetaminophen (TYLENOL) 500 MG tablet Take 500 mg by mouth at bedtime.    [provider]  alendronate (FOSAMAX) 70 MG tablet TAKE 1 TABLET EVERY MONDAY. TAKE WITH A FULL GLASS OF WATER ON AN EMPTY STOMACH. 06/29/19   [name], MD  aspirin EC 81 MG tablet Take 81 mg by mouth at bedtime.    [provider]  Carboxymethylcellulose Sodium (LUBRICANT EYE DROPS PF OP) Place 1 drop into both eyes 3 (three) times daily as needed (for dry eyes.).  11/11/17   [provider]  cholecalciferol (VITAMIN D) 1000 UNITS tablet Take 1,000 Units by mouth daily.  11/26/11   Roselee Nova, MD  clotrimazole-betamethasone (LOTRISONE) cream Apply 1 application topically 2 (two) times daily. Apply  to affected area BID x 7 d then decrease to once daily if still having rash 05/30/19   Delsa Grana, PA-C  donepezil (ARICEPT) 10 MG tablet TAKE 1 TABLET (10 MG TOTAL) BY MOUTH AT BEDTIME. 07/12/19   Ancil Boozer, Drue Stager, MD  finasteride (PROSCAR) 5 MG tablet Take 1 tablet (5 mg total) by mouth daily. 02/01/19   Billey Co, MD  fluticasone (FLONASE) 50 MCG/ACT nasal spray Place 2 sprays into both nostrils daily. 04/12/19   Sowles, Drue Stager, MD   latanoprost (XALATAN) 0.005 % ophthalmic solution Place 1 drop into both eyes at bedtime.     [provider]  Menthol-Methyl Salicylate (MUSCLE RUB EX) Apply 1 application topically 4 (four) times daily as needed (for pain.).  08/11/16   [provider]  simvastatin (ZOCOR) 5 MG tablet Take 1 tablet (5 mg total) by mouth at bedtime. 04/12/19   [name], MD  tamsulosin (FLOMAX) 0.4 MG CAPS capsule Take 1 capsule (0.4 mg total) by mouth daily. 01/29/19   Billey Co, MD  timolol (TIMOPTIC) 0.5 % ophthalmic solution Place 1 drop into both eyes daily.  04/28/15   [provider]  vitamin B-12 (CYANOCOBALAMIN) 1000 MCG tablet Take 1,000 mcg by mouth daily.     [provider]    No Known Allergies  Family History  Problem Relation Age of Onset  . Cancer Mother   . Hypertension Mother   . Diabetes Father   . Hyperlipidemia Father   . Diabetes Maternal Grandmother   . Diabetes Brother   . Kidney disease Brother        on dialysis  . Diabetes Sister   . Kidney disease Sister        dialysis  . Cancer Brother        esophageal  . Leukemia Sister     Social History Social History   Tobacco Use  . Smoking status: Former Smoker    Packs/day: 1.00    Years: 34.00    Pack years: 34.00    Types: Cigarettes    Start date: 08/02/1945    Quit date: 05/26/1980    Years since quitting: 39.2  . Smokeless tobacco: Never Used  . Tobacco comment: smoking cessation materials not required  Substance Use Topics  . Alcohol use: No    Alcohol/week: 0.0 standard drinks  . Drug use: No    Review of Systems Unable to obtain adequate/accurate review of systems secondary to baseline dementia. ____________________________________________   PHYSICAL EXAM:  VITAL SIGNS: ED Triage Vitals  Enc Vitals Group     BP 08/13/19 1239 108/66     Pulse Rate 08/13/19 1239 88     Resp 08/13/19 1239 20     Temp 08/13/19 1239 98 F (36.7 C)     Temp Source  08/13/19 1239 Oral     SpO2 08/13/19 1239 96 %     Weight 08/13/19 1242 173 lb (78.5 kg)     Height 08/13/19 1242 5\' 7"  (1.702 m)     Head Circumference --      Peak Flow --      Pain Score 08/13/19 1242 3     Pain Loc --      Pain Edu? --      Excl. in [place]? --     Constitutional: Awake alert, no acute distress, well appearing and pleasant. Eyes: Normal exam ENT      Head: Small abrasion to bridge of nose.  No laceration.  Mild amount of dried blood in bilateral nostrils without septal hematoma      Mouth/Throat: Mucous membranes are moist. Cardiovascular: Normal rate, regular rhythm.  Respiratory: Normal respiratory effort without tachypnea nor retractions. Breath sounds are clear  Gastrointestinal: Soft and nontender. No distention.   Musculoskeletal: Nontender with normal range of motion in all extremities.  No tenderness elicited in lower and upper extremities with active or passive motion. Neurologic:  Normal speech and language. No gross focal neurologic deficits Skin:  Skin is warm, dry and intact.  Psychiatric: Mood and affect are normal.   ____________________________________________    EKG  EKG viewed and interpreted by myself shows a normal sinus rhythm at 92 bpm with a widened QRS, left axis deviation, largely normal intervals nonspecific ST changes.  Morphology consistent with left bundle branch block.  Left arm branch block present on prior EKG 01/20/2018.  ____________________________________________    RADIOLOGY  Chest x-ray shows scar tissue no acute abnormality. CT scans are negative for acute injury or finding.  ____________________________________________   INITIAL IMPRESSION / ASSESSMENT AND PLAN / ED COURSE  Pertinent labs & imaging results that were available during my care of the patient were reviewed by me and considered in my medical decision making (see chart for details).   Patient presents to the emergency department after a fall earlier this  morning later began complaining of some chest discomfort which appears to be when he was moving.  Patient's work-up is overall reassuring including a negative chest x-ray negative CT scans.  Patient did have a very slight troponin elevation on his lab work otherwise baseline lab work.  We will repeat a troponin to ensure no continued elevation.  Anticipate likely discharge home if unchanged.  Patient and family agreeable to this plan of care.  Patient's repeat troponin is unchanged.  Patient continues to appear well we will discharge home with family.  LANDIS VARON was evaluated in Emergency Department on 08/13/2019 for the symptoms described in the history of present illness. He was evaluated in the context of the global COVID-19 pandemic, which necessitated consideration that the patient might be at risk for infection with the SARS-CoV-2 virus that causes COVID-19. Institutional protocols and algorithms that pertain to the evaluation of patients at risk for COVID-19 are in a state of rapid change based on information released by regulatory bodies including the CDC and federal and state organizations. These policies and algorithms were followed during the patient's care in the ED.  ____________________________________________   FINAL CLINICAL IMPRESSION(S) / ED DIAGNOSES  Teryl Lucy   Harvest Dark, MD 08/13/19 1732

## 2019-08-15 ENCOUNTER — Other Ambulatory Visit: Payer: Self-pay | Admitting: Family Medicine

## 2019-08-15 DIAGNOSIS — J302 Other seasonal allergic rhinitis: Secondary | ICD-10-CM

## 2019-08-15 DIAGNOSIS — J3089 Other allergic rhinitis: Secondary | ICD-10-CM

## 2019-08-30 ENCOUNTER — Other Ambulatory Visit: Payer: Self-pay

## 2019-08-30 ENCOUNTER — Ambulatory Visit (INDEPENDENT_AMBULATORY_CARE_PROVIDER_SITE_OTHER): Payer: Medicare HMO | Admitting: Family Medicine

## 2019-08-30 ENCOUNTER — Encounter: Payer: Self-pay | Admitting: Family Medicine

## 2019-08-30 VITALS — BP 90/52 | HR 105 | Temp 97.7°F | Resp 16 | Ht 67.0 in | Wt 168.6 lb

## 2019-08-30 DIAGNOSIS — I959 Hypotension, unspecified: Secondary | ICD-10-CM | POA: Diagnosis not present

## 2019-08-30 DIAGNOSIS — J439 Emphysema, unspecified: Secondary | ICD-10-CM | POA: Diagnosis not present

## 2019-08-30 DIAGNOSIS — I714 Abdominal aortic aneurysm, without rupture, unspecified: Secondary | ICD-10-CM

## 2019-08-30 DIAGNOSIS — R4189 Other symptoms and signs involving cognitive functions and awareness: Secondary | ICD-10-CM

## 2019-08-30 DIAGNOSIS — J302 Other seasonal allergic rhinitis: Secondary | ICD-10-CM | POA: Diagnosis not present

## 2019-08-30 DIAGNOSIS — J3089 Other allergic rhinitis: Secondary | ICD-10-CM

## 2019-08-30 DIAGNOSIS — Z9181 History of falling: Secondary | ICD-10-CM | POA: Diagnosis not present

## 2019-08-30 DIAGNOSIS — D649 Anemia, unspecified: Secondary | ICD-10-CM | POA: Diagnosis not present

## 2019-08-30 MED ORDER — LORATADINE 10 MG PO TABS: 10.0000 mg | ORAL_TABLET | Freq: Every day | ORAL | 1 refills | Status: DC

## 2019-08-30 NOTE — Progress Notes (Signed)
Name: Roy Hernandez   MRN: [id number]    DOB: [birth year]   Date:08/30/2019       Progress Note  Subjective  Chief Complaint  Chief Complaint  Patient presents with  . Medication Refill    4 month F/U  . Cognitive dysfunction  . Balance problems    Had a recent fall-all studies came back Negative at ER. Muscle pains and chest discomfort from the fall August 13, 2019  . Senile Purpura  . Allergic Rhinitis   . PVD and AAA    HPI  Recent Fall: He got up around 4:30 am and tripped on his covers when trying to go to the bathroom, he fell face forward and hit his face on the ground and had an abrasion he also hit his chest on the ground. They took him to [facility]. He had negative CT brain, normal x-ray chest. Daughter states chest wall was sore for about two weeks, when he moved or applied pressure but resolved now. Since fall he seems a little insecure about moving, but still independent and uses a cane to help with balance. Appetite is normal. He is no longer driving, daughter's have been providing full care. They clean, call him to remind him about medication, cooking. He is able to microwave his food. Family needs some assistance to bath and help him around the house     FRAIL screen was 3, he has incontinence usually urine about once a week,  and about 3 times a month he has bowel incontinence, daughter says not constipated. Chronic and stable.   Anemia: mild, discussed with patient and daughter, not interested in finding out the cause, he will take multivitamin with iron for now  Low BP: it goes up and down, usually in the 110-120's, but occasionally lower. He states occasionally feels dizzy or lightheaded, tries to get up slowly and stands before walking. He sees nephrologist but not recently.   Aneurysm aorta: he has follow up with vascular surgeon sometime this year   Emphysema: seen on CT neck, upper lungs, he quit smoking may years ago - over 25 , no cough, no wheezing or sob  Patient  Active Problem List   Diagnosis Date Noted  . Senile purpura ([place]) 09/01/2018  . Neuropathy 06/16/2018  . 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 Left bundle-branch block 02/13/2018  . History of right inguinal hernia repair 12/16/2017  . Epiretinal membrane (ERM) of right eye 10/11/2017  . DJD (degenerative joint disease) 10/04/2017  . Hyperglycemia 10/04/2017  . Advanced stage glaucoma 10/04/2017  . Hypertension, benign 07/06/2017  . PAD (peripheral artery disease) ([place]) 10/19/2016  . Osteoporosis of femur without pathological fracture 11/25/2015  . AAA (abdominal aortic aneurysm) without rupture ([place]) 11/05/2014  . Abnormal ECG 11/05/2014  . At risk for falling 11/05/2014  . DD (diverticular disease) 11/05/2014  . Dyslipidemia 11/05/2014  . Failure of erection 11/05/2014  . Degenerative arthritis of lumbar spine 11/05/2014  . Compressed spine fracture ([place]) 11/05/2014  . Basal cell papilloma 11/05/2014  . Vitamin D deficiency 11/05/2014    Past Surgical History:  Procedure Laterality Date  . CATARACT EXTRACTION Bilateral   . HERNIA REPAIR  June 2019  . INGUINAL HERNIA REPAIR Right 01/27/2018   Medium Ultra Pro mesh.  Surgeon: Robert Bellow, MD;  Location: [facility] ORS;  Service: General;  Laterality: Right;  with mesh  . TOOTH EXTRACTION      Family History  Problem Relation Age of Onset  . Cancer  Mother   . Hypertension Mother   . Diabetes Father   . Hyperlipidemia Father   . Diabetes Maternal Grandmother   . Diabetes Brother   . Kidney disease Brother        on dialysis  . Diabetes Sister   . Kidney disease Sister        dialysis  . Cancer Brother        esophageal  . Leukemia Sister      Current Outpatient Medications:  .  acetaminophen (TYLENOL) 500 MG tablet, Take 500 mg by mouth at bedtime., Disp: , Rfl:  .  alendronate (FOSAMAX) 70 MG tablet, TAKE 1 TABLET EVERY MONDAY. TAKE WITH A FULL GLASS OF WATER ON AN EMPTY STOMACH., Disp: 12 tablet, Rfl: 3 .   aspirin EC 81 MG tablet, Take 81 mg by mouth at bedtime., Disp: , Rfl:  .  Carboxymethylcellulose Sodium (LUBRICANT EYE DROPS PF OP), Place 1 drop into both eyes 3 (three) times daily as needed (for dry eyes.). , Disp: , Rfl:  .  cholecalciferol (VITAMIN D) 1000 UNITS tablet, Take 1,000 Units by mouth daily. , Disp: , Rfl:  .  donepezil (ARICEPT) 10 MG tablet, TAKE 1 TABLET (10 MG TOTAL) BY MOUTH AT BEDTIME., Disp: 90 tablet, Rfl: 1 .  finasteride (PROSCAR) 5 MG tablet, Take 1 tablet (5 mg total) by mouth daily., Disp: 90 tablet, Rfl: 3 .  fluticasone (FLONASE) 50 MCG/ACT nasal spray, USE 2 SPRAYS INTO BOTH NOSTRILS DAILY., Disp: 48 g, Rfl: 1 .  latanoprost (XALATAN) 0.005 % ophthalmic solution, Place 1 drop into both eyes at bedtime. , Disp: , Rfl:  .  Menthol-Methyl Salicylate (MUSCLE RUB EX), Apply 1 application topically 4 (four) times daily as needed (for pain.). , Disp: , Rfl:  .  simvastatin (ZOCOR) 5 MG tablet, Take 1 tablet (5 mg total) by mouth at bedtime., Disp: 90 tablet, Rfl: 1 .  tamsulosin (FLOMAX) 0.4 MG CAPS capsule, Take 1 capsule (0.4 mg total) by mouth daily., Disp: 90 capsule, Rfl: 3 .  timolol (TIMOPTIC) 0.5 % ophthalmic solution, Place 1 drop into both eyes daily. , Disp: , Rfl:  .  vitamin B-12 (CYANOCOBALAMIN) 1000 MCG tablet, Take 1,000 mcg by mouth daily. , Disp: , Rfl:   No Known Allergies  I personally reviewed active problem list, medication list, allergies, family history, social history with the patient/caregiver today.   ROS  Constitutional: Negative for fever or weight change.  Respiratory: Negative for cough and shortness of breath.   Cardiovascular: Negative for chest pain or palpitations.  Gastrointestinal: Negative for abdominal pain, no bowel changes.  Musculoskeletal: positive  for gait problem but no  joint swelling.  Skin: Negative for rashe Neurological: Positive  for dizziness but no  headache.  No other specific complaints in a complete review  of systems (except as listed in HPI above).  Objective  Vitals:   08/30/19 0852  BP: (!) 90/52  Pulse: (!) 105  Resp: 16  Temp: 97.7 F (36.5 C)  TempSrc: Temporal  SpO2: 90%  Weight: 168 lb 9.6 oz (76.5 kg)  Height: 5\' 7"  (1.702 m)    Body mass index is 26.41 kg/m.  Physical Exam  Constitutional: Patient appears frail.  No distress.  HEENT: head atraumatic, normocephalic, pupils equal and reactive to light Cardiovascular: Normal rate, regular rhythm and normal heart sounds.  No murmur heard. No BLE edema. Pulmonary/Chest: Effort normal and breath sounds normal. No respiratory distress. Abdominal: Soft.  There is  no tenderness. Psychiatric: Patient has a normal mood and affect. behavior is normal.  Quiet he has hearing loss    Recent Results (from the past 2160 hour(s))  Basic metabolic panel     Status: Abnormal   Collection Time: 08/13/19 12:54 PM  Result Value Ref Range   Sodium 139 135 - 145 mmol/L   Potassium 3.8 3.5 - 5.1 mmol/L   Chloride 104 98 - 111 mmol/L   CO2 26 22 - 32 mmol/L   Glucose, Bld 135 (H) 70 - 99 mg/dL   BUN 21 8 - 23 mg/dL   Creatinine, Ser 0.94 0.61 - 1.24 mg/dL   Calcium 9.0 8.9 - 10.3 mg/dL   GFR calc non Af Amer >60 >60 mL/min   GFR calc Af Amer >60 >60 mL/min   Anion gap 9 5 - 15    Comment: Performed at [facility], [address] [place], [place] [address]  CBC     Status: Abnormal   Collection Time: 08/13/19 12:54 PM  Result Value Ref Range   WBC 6.5 4.0 - 10.5 K/uL   RBC 3.93 (L) 4.22 - 5.81 MIL/uL   Hemoglobin 12.8 (L) 13.0 - 17.0 g/dL   HCT 38.1 (L) 39.0 - 52.0 %   MCV 96.9 80.0 - 100.0 fL   MCH 32.6 26.0 - 34.0 pg   MCHC 33.6 30.0 - 36.0 g/dL   RDW 12.3 11.5 - 15.5 %   Platelets 155 150 - 400 K/uL   nRBC 0.0 0.0 - 0.2 %    Comment: Performed at [facility], [place], [place] [address]  Troponin I (High Sensitivity)     Status: Abnormal   Collection Time: 08/13/19 12:54 PM  Result  Value Ref Range   Troponin I (High Sensitivity) 24 (H) <18 ng/L    Comment: (NOTE) Elevated high sensitivity troponin I (hsTnI) values and significant  changes across serial measurements may suggest ACS but many other  chronic and acute conditions are known to elevate hsTnI results.  Refer to the "Links" section for chest pain algorithms and additional  guidance. Performed at [facility], [place], [place] [address]   Troponin I (High Sensitivity)     Status: Abnormal   Collection Time: 08/13/19  4:53 PM  Result Value Ref Range   Troponin I (High Sensitivity) 24 (H) <18 ng/L    Comment: (NOTE) Elevated high sensitivity troponin I (hsTnI) values and significant  changes across serial measurements may suggest ACS but many other  chronic and acute conditions are known to elevate hsTnI results.  Refer to the "Links" section for chest pain algorithms and additional  guidance. Performed at [facility], [place], [place] [address]       PHQ2/9: Depression screen [facility] 2/9 08/30/2019 05/30/2019 04/12/2019 10/10/2018 10/10/2018  Decreased Interest 0 0 0 0 0  Down, Depressed, Hopeless 0 0 0 0 0  PHQ - 2 Score 0 0 0 0 0  Altered sleeping 0 0 0 0 -  Tired, decreased energy 0 0 0 0 -  Change in appetite 0 0 0 0 -  Feeling bad or failure about yourself  0 0 0 0 -  Trouble concentrating 0 0 0 1 -  Moving slowly or fidgety/restless 0 0 0 0 -  Suicidal thoughts 0 0 0 0 -  PHQ-9 Score 0 0 0 1 -  Difficult doing work/chores Not difficult at all Not difficult at  all - Not difficult at all -  Some recent data might be hidden    phq 9 is negative   Fall Risk: Fall Risk  08/30/2019 05/30/2019 10/10/2018 10/10/2018 09/01/2018  Falls in the past year? 1 1 0 1 1  Number falls in past yr: 0 1 0 1 1  Injury with Fall? 1 1 0 1 1  Risk Factor Category  - - - - -  Risk for fall due to : Impaired balance/gait - - History of fall(s);Impaired balance/gait  Impaired balance/gait;History of fall(s)  Risk for fall due to: Comment - - - - -  Follow up - - - Falls prevention discussed -     Functional Status Survey: Is the patient deaf or have difficulty hearing?: Yes Does the patient have difficulty seeing, even when wearing glasses/contacts?: Yes Does the patient have difficulty concentrating, remembering, or making decisions?: No Does the patient have difficulty walking or climbing stairs?: Yes Does the patient have difficulty dressing or bathing?: No Does the patient have difficulty doing errands alone such as visiting a doctor's office or shopping?: Yes    Assessment & Plan  1. Persistent cognitive impairment  - Ambulatory referral to Home Health  2. History of recent fall  - Ambulatory referral to [place]  3. Hypotension, unspecified hypotension type  - Ambulatory referral to [place]  4. AAA (abdominal aortic aneurysm) without rupture ([place])   5. Anemia, unspecified type  - Ambulatory referral to [place]  6. Perennial allergic rhinitis with seasonal variation  - loratadine (CLARITIN) 10 MG tablet; Take 1 tablet (10 mg total) by mouth daily.  Dispense: 90 tablet; Refill: 1  7. Pulmonary emphysema, unspecified emphysema type ([place])

## 2019-09-05 ENCOUNTER — Telehealth: Payer: Self-pay | Admitting: Oncology

## 2019-09-05 ENCOUNTER — Other Ambulatory Visit: Payer: Self-pay | Admitting: Family Medicine

## 2019-09-05 DIAGNOSIS — E785 Hyperlipidemia, unspecified: Secondary | ICD-10-CM

## 2019-09-05 NOTE — Telephone Encounter (Signed)
Writer spoke with daughter on this date who stated that she wanted to cancel lab and MD appts for 10-11-19. She said that patient's dementia has worsened and does not feel that appts are needed at this time. Daughter to phone to reschedule if needed. MD is aware.

## 2019-10-08 ENCOUNTER — Ambulatory Visit: Payer: Medicare HMO | Admitting: Family Medicine

## 2019-10-12 ENCOUNTER — Other Ambulatory Visit: Payer: Medicare HMO

## 2019-10-12 ENCOUNTER — Ambulatory Visit: Payer: Medicare HMO | Admitting: Oncology

## 2019-11-02 ENCOUNTER — Other Ambulatory Visit: Payer: Self-pay

## 2019-11-02 ENCOUNTER — Ambulatory Visit
Admission: RE | Admit: 2019-11-02 | Discharge: 2019-11-02 | Disposition: A | Discharge status: Home / Self Care | Payer: Medicare HMO | Source: Ambulatory Visit | Attending: Urology | Admitting: Urology

## 2019-11-02 DIAGNOSIS — R3914 Feeling of incomplete bladder emptying: Secondary | ICD-10-CM | POA: Diagnosis not present

## 2019-11-02 DIAGNOSIS — N3289 Other specified disorders of bladder: Secondary | ICD-10-CM | POA: Insufficient documentation

## 2019-11-02 DIAGNOSIS — N401 Enlarged prostate with lower urinary tract symptoms: Secondary | ICD-10-CM | POA: Diagnosis not present

## 2019-11-02 DIAGNOSIS — N400 Enlarged prostate without lower urinary tract symptoms: Secondary | ICD-10-CM | POA: Diagnosis not present

## 2019-11-05 ENCOUNTER — Ambulatory Visit: Payer: Medicare HMO | Admitting: Urology

## 2019-11-07 ENCOUNTER — Ambulatory Visit: Payer: Self-pay | Admitting: Urology

## 2019-11-09 ENCOUNTER — Encounter: Payer: Self-pay | Admitting: Family Medicine

## 2019-11-13 ENCOUNTER — Other Ambulatory Visit: Payer: Self-pay

## 2019-11-13 ENCOUNTER — Ambulatory Visit (INDEPENDENT_AMBULATORY_CARE_PROVIDER_SITE_OTHER): Payer: Medicare HMO

## 2019-11-13 DIAGNOSIS — Z0000 Encounter for general adult medical examination without abnormal findings: Secondary | ICD-10-CM

## 2019-11-13 DIAGNOSIS — Z789 Other specified health status: Secondary | ICD-10-CM | POA: Diagnosis not present

## 2019-11-13 NOTE — Progress Notes (Signed)
Subjective:   Roy Hernandez is a 86 y.o. male who presents for an Initial Medicare Annual preventive examination.  Virtual Visit via Telephone Note  I connected with Roy Hernandez on 11/13/19 at  8:00 AM EDT by telephone and verified that I am speaking with the correct person using two identifiers.  Medicare Annual Wellness visit completed telephonically due to Covid-19 pandemic.   Location: Patient: home Provider: office   I discussed the limitations, risks, security and privacy concerns of performing an evaluation and management service by telephone and the availability of in person appointments. The patient expressed understanding and agreed to proceed.  Some vital signs may be absent or patient reported.   Clemetine Marker, LPN    Review of Systems:   Cardiac Risk Factors include: advanced age (>73men, >62 women);dyslipidemia;male gender     Objective:    Vitals: There were no vitals taken for this visit.  There is no height or weight on file to calculate BMI.  Advanced Directives 08/13/2019 02/06/2019 10/10/2018 08/22/2018 06/09/2018 03/10/2018 02/24/2018  Does Patient Have a Medical Advance Directive? No No Yes Yes No No Yes  Type of Advance Directive - Public librarian;Living will - - Special educational needs teacher of [place];Living will  Does patient want to make changes to medical advance directive? - - - - - No - Patient declined No - Patient declined  Copy of [place] in Chart? - - Yes - validated most recent copy scanned in chart (See row information) - - Yes Yes  Would patient like information on creating a medical advance directive? - No - Patient declined - - No - Patient declined No - Patient declined -    Tobacco Social History   Tobacco Use  Smoking Status Former Smoker  . Packs/day: 1.00  . Years: 34.00  . Pack years: 34.00  . Types: Cigarettes  . Start date: 08/02/1945  . Quit date: 05/26/1980  . Years since  quitting: 39.4  Smokeless Tobacco Never Used  Tobacco Comment   smoking cessation materials not required     Counseling given: Not Answered Comment: smoking cessation materials not required   Clinical Intake:  Pre-visit preparation completed: Yes  Pain : No/denies pain     Diabetes: No  How often do you need to have someone help you when you read instructions, pamphlets, or other written materials from your doctor or pharmacy?: 5 - Always  Interpreter Needed?: No  Information entered by :: Clemetine Marker LPN  Past Medical History:  Diagnosis Date  . AAA (abdominal aortic aneurysm) ([place])   . Arthritis   . Cataract   . Dementia ([place])   . Diabetes mellitus without complication ([facility])    diet controlled  . Glaucoma   . Hyperkalemia   . Hyperlipidemia   . Hypertension   . Inguinal hernia of right side without obstruction or gangrene   . Osteoporosis   . Peripheral vascular disease ([place])   . Vitamin D deficiency    Past Surgical History:  Procedure Laterality Date  . CATARACT EXTRACTION Bilateral   . HERNIA REPAIR  June 2019  . INGUINAL HERNIA REPAIR Right 01/27/2018   Medium Ultra Pro mesh.  Surgeon: Robert Bellow, MD;  Location: [facility] ORS;  Service: General;  Laterality: Right;  with mesh  . TOOTH EXTRACTION     Family History  Problem Relation Age of Onset  . Cancer Mother   . Hypertension Mother   .  Diabetes Father   . Hyperlipidemia Father   . Diabetes Maternal Grandmother   . Diabetes Brother   . Kidney disease Brother        on dialysis  . Diabetes Sister   . Kidney disease Sister        dialysis  . Cancer Brother        esophageal  . Leukemia Sister    Social History   Socioeconomic History  . Marital status: Widowed    Spouse name: Roy Hernandez  . Number of children: 3  . Years of education: Not on file  . Highest education level: 9th grade  Occupational History    Employer: [employer]  Tobacco Use  . Smoking status: Former Smoker    Packs/day:  1.00    Years: 34.00    Pack years: 34.00    Types: Cigarettes    Start date: 08/02/1945    Quit date: 05/26/1980    Years since quitting: 39.4  . Smokeless tobacco: Never Used  . Tobacco comment: smoking cessation materials not required  Substance and Sexual Activity  . Alcohol use: No    Alcohol/week: 0.0 standard drinks  . Drug use: No  . Sexual activity: Not Currently  Other Topics Concern  . Not on file  Social History Narrative  . Not on file   Social Determinants of Health   Financial Resource Strain: Low Risk   . Difficulty of Paying Living Expenses: Not hard at all  Food Insecurity: No Food Insecurity  . Worried About Charity fundraiser in the Last Year: Never true  . Ran Out of Food in the Last Year: Never true  Transportation Needs: No Transportation Needs  . Lack of Transportation (Medical): No  . Lack of Transportation (Non-Medical): No  Physical Activity: Unknown  . Days of Exercise per Week: Not on file  . Minutes of Exercise per Session: 0 min  Stress: No Stress Concern Present  . Feeling of Stress : Not at all  Social Connections: Somewhat Isolated  . Frequency of Communication with Friends and Family: More than three times a week  . Frequency of Social Gatherings with Friends and Family: Three times a week  . Attends Religious Services: More than 4 times per year  . Active Member of Clubs or Organizations: No  . Attends Archivist Meetings: Never  . Marital Status: Widowed    Outpatient Encounter Medications as of 11/13/2019  Medication Sig  . acetaminophen (TYLENOL) 500 MG tablet Take 500 mg by mouth at bedtime.  [name] alendronate (FOSAMAX) 70 MG tablet TAKE 1 TABLET EVERY MONDAY. TAKE WITH A FULL GLASS OF WATER ON AN EMPTY STOMACH.  [name] aspirin EC 81 MG tablet Take 81 mg by mouth at bedtime.  . Carboxymethylcellulose Sodium (LUBRICANT EYE DROPS PF OP) Place 1 drop into both eyes 3 (three) times daily as needed (for dry eyes.).   [name] cholecalciferol  (VITAMIN D) 1000 UNITS tablet Take 1,000 Units by mouth daily.   [name] donepezil (ARICEPT) 10 MG tablet TAKE 1 TABLET (10 MG TOTAL) BY MOUTH AT BEDTIME.  . finasteride (PROSCAR) 5 MG tablet Take 1 tablet (5 mg total) by mouth daily.  . fluticasone (FLONASE) 50 MCG/ACT nasal spray USE 2 SPRAYS INTO BOTH NOSTRILS DAILY.  [name] latanoprost (XALATAN) 0.005 % ophthalmic solution Place 1 drop into both eyes at bedtime.   [name] loratadine (CLARITIN) 10 MG tablet Take 1 tablet (10 mg total) by mouth daily.  . Menthol-Methyl Salicylate (MUSCLE  RUB EX) Apply 1 application topically 4 (four) times daily as needed (for pain.).   [name] simvastatin (ZOCOR) 5 MG tablet TAKE 1 TABLET AT BEDTIME  . tamsulosin (FLOMAX) 0.4 MG CAPS capsule Take 1 capsule (0.4 mg total) by mouth daily.  . timolol (TIMOPTIC) 0.5 % ophthalmic solution Place 1 drop into both eyes daily.   . vitamin B-12 (CYANOCOBALAMIN) 1000 MCG tablet Take 1,000 mcg by mouth daily.    No facility-administered encounter medications on file as of 11/13/2019.    Activities of Daily Living In your present state of health, do you have any difficulty performing the following activities: 11/13/2019 08/30/2019  Hearing? Y Y  Comment declines hearing aids -  Vision? Y Y  Difficulty concentrating or making decisions? Y N  Walking or climbing stairs? Y Y  Dressing or bathing? Y N  Doing errands, shopping? Tempie Donning  Preparing Food and eating ? Y -  Using the Toilet? N -  In the past six months, have you accidently leaked urine? Y -  Do you have problems with loss of bowel control? N -  Managing your Medications? Y -  Managing your Finances? Y -  Housekeeping or managing your Housekeeping? Y -  Some recent data might be hidden    Patient Care Team: [name], MD as PCP - General (Family Medicine) Brendolyn Patty, MD as Consulting Physician (Dermatology) Delana Meyer, Dolores Lory, MD as Consulting Physician (Vascular Surgery)   Assessment:   This is a routine wellness  examination for [place].  Exercise Activities and Dietary recommendations Current Exercise Habits: The patient does not participate in regular exercise at present, Exercise limited by: psychological condition(s);orthopedic condition(s)  Goals    . DIET - INCREASE WATER INTAKE     Recommend to drink at least 6-8 8oz glasses of water per day.       Fall Risk Fall Risk  11/13/2019 08/30/2019 05/30/2019 10/10/2018 10/10/2018  Falls in the past year? 1 1 1  0 1  Number falls in past yr: 1 0 1 0 1  Injury with Fall? 0 1 1 0 1  Risk Factor Category  - - - - -  Risk for fall due to : Impaired balance/gait;Other (Comment) Impaired balance/gait - - History of fall(s);Impaired balance/gait  Risk for fall due to: Comment cognitive impairment - - - -  Follow up Falls prevention discussed - - - Falls prevention discussed   FALL RISK PREVENTION PERTAINING TO THE HOME:  Any stairs in or around the home? Yes  If so, do they handrails? No  - 1 step to get inside  Home free of loose throw rugs in walkways, pet beds, electrical cords, etc? Yes  Adequate lighting in your home to reduce risk of falls? Yes   ASSISTIVE DEVICES UTILIZED TO PREVENT FALLS:  Life alert? No  Use of a cane, walker or w/c? Yes  Grab bars in the bathroom? Yes  Shower chair or bench in shower? Yes  Elevated toilet seat or a handicapped toilet? No   DME ORDERS:  DME order needed?  No   TIMED UP AND GO:  Was the test performed? No . Telephonic visit  Education: Fall risk prevention has been discussed.  Intervention(s) required? No   Depression Screen PHQ 2/9 Scores 11/13/2019 08/30/2019 05/30/2019 04/12/2019  PHQ - 2 Score 0 0 0 0  PHQ- 9 Score - 0 0 0    Cognitive Function MMSE - Mini Mental State Exam 10/10/2018 11/02/2017  Orientation to time  2 3  Orientation to Place 4 4  Registration 3 3  Attention/ Calculation 5 0  Recall 0 0  Language- name 2 objects 2 2  Language- repeat 1 0  Language- follow 3 step command 3  2  Language- read & follow direction 1 1  Write a sentence 1 1  Copy design 1 0  Total score 23 16     6CIT Screen 10/04/2017 10/04/2016  What Year? 0 points 0 points  What month? 0 points 0 points  What time? 3 points 0 points  Count back from 20 4 points 0 points  Months in reverse 4 points 4 points  Repeat phrase 10 points 8 points  Total Score 21 12    Immunization History  Administered Date(s) Administered  . Fluad Quad(high Dose 65+) 04/12/2019  . Influenza, High Dose Seasonal PF 05/02/2014, 04/30/2015, 05/02/2017, 04/24/2018  . Influenza, Seasonal, Injecte, Preservative Fre 04/13/2012  . Influenza,inj,Quad PF,6+ Mos 05/03/2013  . Influenza-Unspecified 05/02/2014  . PFIZER SARS-COV-2 Vaccination 09/13/2019, 10/04/2019  . Pneumococcal Conjugate-13 06/13/2014  . Pneumococcal Polysaccharide-23 06/12/2013  . Tdap 10/05/2017    Qualifies for Shingles Vaccine? Yes  . Due for Shingrix. Education has been provided regarding the importance of this vaccine. Pt has been advised to call insurance company to determine out of pocket expense. Advised may also receive vaccine at local pharmacy or Health Dept. Verbalized acceptance and understanding.  Tdap: Up to date  Flu Vaccine: Up to date  Pneumococcal Vaccine: Up to date   Screening Tests Health Maintenance  Topic Date Due  . INFLUENZA VACCINE  03/02/2020  . TETANUS/TDAP  10/06/2027  . PNA vac Low Risk Adult  Completed   Cancer Screenings:  Colorectal Screening: No longer required.   Lung Cancer Screening: (Low Dose CT Chest recommended if Age 30-80 years, 30 pack-year currently smoking OR have quit w/in 15years.) does not qualify.   Additional Screening:  Hepatitis C Screening: no longer required  Vision Screening: Recommended annual ophthalmology exams for early detection of glaucoma and other disorders of the eye. Is the patient up to date with their annual eye exam?  Yes  Who is the provider or what is the name of  the office in which the pt attends annual eye exams? [place] Screening: Recommended annual dental exams for proper oral hygiene  Community Resource Referral:  CRR required this visit?  No       Plan:    I have personally reviewed and addressed the Medicare Annual Wellness questionnaire and have noted the following in the patient's chart:  A. Medical and social history B. Use of alcohol, tobacco or illicit drugs  C. Current medications and supplements D. Functional ability and status E.  Nutritional status F.  Physical activity G. Advance directives H. List of other physicians I.  Hospitalizations, surgeries, and ER visits in previous 12 months J.  Cucumber such as hearing and vision if needed, cognitive and depression L. Referrals and appointments   In addition, I have reviewed and discussed with patient certain preventive protocols, quality metrics, and best practice recommendations. A written personalized care plan for preventive services as well as general preventive health recommendations were provided to patient.   Signed,  Clemetine Marker, LPN Nurse Health Advisor   Nurse Notes: Spoke to patient's daughter, Roy Hernandez regarding referral sent to [facility] on 08/30/19. They have still not been contacted for home health services. Provided phone number to [facility] to follow  up. Patient also has appt with Dr. Ancil Boozer to tomorrow and will discuss worsening dementia.. See recent MyChart messages for concerns. Referral sent to [facility] for social work due for resource and possibly looking into long term care options. Family would prefer to keep the patient at home but often times he cannot recall the name of his children, names of foods, doesn't want to bathe & becoming easily agitated. Patient does have good family support at this time. [facility] referral also offered for caregiver support resources but declines at this time & will discuss with Research officer, political party.

## 2019-11-13 NOTE — Patient Instructions (Signed)
Roy Hernandez , Thank you for taking time to come for your Medicare Wellness Visit. I appreciate your ongoing commitment to your health goals. Please review the following plan we discussed and let me know if I can assist you in the future.   Screening recommendations/referrals: Colonoscopy: no longer required Recommended yearly ophthalmology/optometry visit for glaucoma screening and checkup Recommended yearly dental visit for hygiene and checkup  Vaccinations: Influenza vaccine: done 04/12/19 Pneumococcal vaccine: done 06/13/14 Tdap vaccine: done 10/05/17 Shingles vaccine: Shingrix discussed. Please contact your pharmacy for coverage information.  Covid-19: done 09/13/19 & 10/04/19  Conditions/risks identified: Recommend drinking 6-8 glasses of water per day  Next appointment: Please follow up in one year for your Medicare Annual Wellness visit.    Preventive Care 65 Years and Older, Male Preventive care refers to lifestyle choices and visits with your health care provider that can promote health and wellness. What does preventive care include?  A yearly physical exam. This is also called an annual well check.  Dental exams once or twice a year.  Routine eye exams. Ask your health care provider how often you should have your eyes checked.  Personal lifestyle choices, including:  Daily care of your teeth and gums.  Regular physical activity.  Eating a healthy diet.  Avoiding tobacco and drug use.  Limiting alcohol use.  Practicing safe sex.  Taking low doses of aspirin every day.  Taking vitamin and mineral supplements as recommended by your health care provider. What happens during an annual well check? The services and screenings done by your health care provider during your annual well check will depend on your age, overall health, lifestyle risk factors, and family history of disease. Counseling  Your health care provider may ask you questions about your:  Alcohol  use.  Tobacco use.  Drug use.  Emotional well-being.  Home and relationship well-being.  Sexual activity.  Eating habits.  History of falls.  Memory and ability to understand (cognition).  Work and work Statistician. Screening  You may have the following tests or measurements:  Height, weight, and BMI.  Blood pressure.  Lipid and cholesterol levels. These may be checked every 5 years, or more frequently if you are over 64 years old.  Skin check.  Lung cancer screening. You may have this screening every year starting at age 59 if you have a 30-pack-year history of smoking and currently smoke or have quit within the past 15 years.  Fecal occult blood test (FOBT) of the stool. You may have this test every year starting at age 16.  Flexible sigmoidoscopy or colonoscopy. You may have a sigmoidoscopy every 5 years or a colonoscopy every 10 years starting at age 84.  Prostate cancer screening. Recommendations will vary depending on your family history and other risks.  Hepatitis C blood test.  Hepatitis B blood test.  Sexually transmitted disease (STD) testing.  Diabetes screening. This is done by checking your blood sugar (glucose) after you have not eaten for a while (fasting). You may have this done every 1-3 years.  Abdominal aortic aneurysm (AAA) screening. You may need this if you are a current or former smoker.  Osteoporosis. You may be screened starting at age 3 if you are at high risk. Talk with your health care provider about your test results, treatment options, and if necessary, the need for more tests. Vaccines  Your health care provider may recommend certain vaccines, such as:  Influenza vaccine. This is recommended every year.  Tetanus, diphtheria,  and acellular pertussis (Tdap, Td) vaccine. You may need a Td booster every 10 years.  Zoster vaccine. You may need this after age 34.  Pneumococcal 13-valent conjugate (PCV13) vaccine. One dose is  recommended after age 61.  Pneumococcal polysaccharide (PPSV23) vaccine. One dose is recommended after age 37. Talk to your health care provider about which screenings and vaccines you need and how often you need them. This information is not intended to replace advice given to you by your health care provider. Make sure you discuss any questions you have with your health care provider. Document Released: 08/15/2015 Document Revised: 04/07/2016 Document Reviewed: 05/20/2015 Elsevier Interactive Patient Education  2017 [place] Prevention in the Home Falls can cause injuries. They can happen to people of all ages. There are many things you can do to make your home safe and to help prevent falls. What can I do on the outside of my home?  Regularly fix the edges of walkways and driveways and fix any cracks.  Remove anything that might make you trip as you walk through a door, such as a raised step or threshold.  Trim any bushes or trees on the path to your home.  Use bright outdoor lighting.  Clear any walking paths of anything that might make someone trip, such as rocks or tools.  Regularly check to see if handrails are loose or broken. Make sure that both sides of any steps have handrails.  Any raised decks and porches should have guardrails on the edges.  Have any leaves, snow, or ice cleared regularly.  Use sand or salt on walking paths during winter.  Clean up any spills in your garage right away. This includes oil or grease spills. What can I do in the bathroom?  Use night lights.  Install grab bars by the toilet and in the tub and shower. Do not use towel bars as grab bars.  Use non-skid mats or decals in the tub or shower.  If you need to sit down in the shower, use a plastic, non-slip stool.  Keep the floor dry. Clean up any water that spills on the floor as soon as it happens.  Remove soap buildup in the tub or shower regularly.  Attach bath mats  securely with double-sided non-slip rug tape.  Do not have throw rugs and other things on the floor that can make you trip. What can I do in the bedroom?  Use night lights.  Make sure that you have a light by your bed that is easy to reach.  Do not use any sheets or blankets that are too big for your bed. They should not hang down onto the floor.  Have a firm chair that has side arms. You can use this for support while you get dressed.  Do not have throw rugs and other things on the floor that can make you trip. What can I do in the kitchen?  Clean up any spills right away.  Avoid walking on wet floors.  Keep items that you use a lot in easy-to-reach places.  If you need to reach something above you, use a strong step stool that has a grab bar.  Keep electrical cords out of the way.  Do not use floor polish or wax that makes floors slippery. If you must use wax, use non-skid floor wax.  Do not have throw rugs and other things on the floor that can make you trip. What can I do with my  stairs?  Do not leave any items on the stairs.  Make sure that there are handrails on both sides of the stairs and use them. Fix handrails that are broken or loose. Make sure that handrails are as long as the stairways.  Check any carpeting to make sure that it is firmly attached to the stairs. Fix any carpet that is loose or worn.  Avoid having throw rugs at the top or bottom of the stairs. If you do have throw rugs, attach them to the floor with carpet tape.  Make sure that you have a light switch at the top of the stairs and the bottom of the stairs. If you do not have them, ask someone to add them for you. What else can I do to help prevent falls?  Wear shoes that:  Do not have high heels.  Have rubber bottoms.  Are comfortable and fit you well.  Are closed at the toe. Do not wear sandals.  If you use a stepladder:  Make sure that it is fully opened. Do not climb a closed  stepladder.  Make sure that both sides of the stepladder are locked into place.  Ask someone to hold it for you, if possible.  Clearly mark and make sure that you can see:  Any grab bars or handrails.  First and last steps.  Where the edge of each step is.  Use tools that help you move around (mobility aids) if they are needed. These include:  Canes.  Walkers.  Scooters.  Crutches.  Turn on the lights when you go into a dark area. Replace any light bulbs as soon as they burn out.  Set up your furniture so you have a clear path. Avoid moving your furniture around.  If any of your floors are uneven, fix them.  If there are any pets around you, be aware of where they are.  Review your medicines with your doctor. Some medicines can make you feel dizzy. This can increase your chance of falling. Ask your doctor what other things that you can do to help prevent falls. This information is not intended to replace advice given to you by your health care provider. Make sure you discuss any questions you have with your health care provider. Document Released: 05/15/2009 Document Revised: 12/25/2015 Document Reviewed: 08/23/2014 Elsevier Interactive Patient Education  2017 [employer].

## 2019-11-14 ENCOUNTER — Encounter: Payer: Self-pay | Admitting: Family Medicine

## 2019-11-14 ENCOUNTER — Other Ambulatory Visit: Payer: Self-pay

## 2019-11-14 ENCOUNTER — Ambulatory Visit (INDEPENDENT_AMBULATORY_CARE_PROVIDER_SITE_OTHER): Payer: Medicare HMO | Admitting: Family Medicine

## 2019-11-14 VITALS — BP 110/56 | HR 74 | Temp 97.5°F | Resp 16 | Ht 67.0 in | Wt 172.1 lb

## 2019-11-14 DIAGNOSIS — E538 Deficiency of other specified B group vitamins: Secondary | ICD-10-CM | POA: Diagnosis not present

## 2019-11-14 DIAGNOSIS — I959 Hypotension, unspecified: Secondary | ICD-10-CM | POA: Diagnosis not present

## 2019-11-14 DIAGNOSIS — Z8639 Personal history of other endocrine, nutritional and metabolic disease: Secondary | ICD-10-CM | POA: Diagnosis not present

## 2019-11-14 DIAGNOSIS — R4189 Other symptoms and signs involving cognitive functions and awareness: Secondary | ICD-10-CM | POA: Diagnosis not present

## 2019-11-14 DIAGNOSIS — R296 Repeated falls: Secondary | ICD-10-CM

## 2019-11-14 DIAGNOSIS — D692 Other nonthrombocytopenic purpura: Secondary | ICD-10-CM | POA: Diagnosis not present

## 2019-11-14 DIAGNOSIS — E785 Hyperlipidemia, unspecified: Secondary | ICD-10-CM

## 2019-11-14 DIAGNOSIS — I739 Peripheral vascular disease, unspecified: Secondary | ICD-10-CM

## 2019-11-14 DIAGNOSIS — J439 Emphysema, unspecified: Secondary | ICD-10-CM

## 2019-11-14 MED ORDER — MEMANTINE HCL 5 MG PO TABS: 5.0000 mg | ORAL_TABLET | Freq: Two times a day (BID) | ORAL | 1 refills | Status: AC

## 2019-11-14 NOTE — Progress Notes (Signed)
Name: Roy Hernandez   MRN: [id number]    DOB: February 20, 1928   Date:11/14/2019       Progress Note  Subjective  Chief Complaint  Chief Complaint  Patient presents with  . Hypertension  . Hyperlipidemia  . Hyperglycemia  . Home Health    Wants to discuss getting some assistance in the home.    HPI  History of falls:   He had two falls in the past 15 months, once he broke a rib and last visit bp was low, balance problems went to [facility] had imaging done, since that episode he has been using a walker. He is feeling more anxious when alone.   FRAIL screen went from 3 in January to 6 today, he has intermittent bowel incontinence, using walker, needs help getting dressed, feeling more anxious when alone.   Low BP: it goes up and down, usually in the 110-120's, but occasionally lower. He states occasionally feels dizzy or lightheaded, he has been trying to get up slowly and is stable at this time  Aneurysm aorta: he sees vascular surgeon yearly   Emphysema: seen on CT neck, upper lungs, he quit smoking may years ago - over 25 , denies cough, wheezing or SOB  B12 deficiency: continue supplementation   Senile purpura: stable  Anemia: used to see hematologist but now just on MVI  Cognitive dysfunction: he is getting more forgetful, he forgets the name of his family members, he is taking Aricept. He got aggressive towards his son in law last week, he is repeating same questions. Daughter is getting a life alert for him   Patient Active Problem List   Diagnosis Date Noted  . Senile purpura ([place]) 09/01/2018  . Neuropathy 06/16/2018  . 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 Left bundle-branch block 02/13/2018  . History of right inguinal hernia repair 12/16/2017  . Epiretinal membrane (ERM) of right eye 10/11/2017  . DJD (degenerative joint disease) 10/04/2017  . Hyperglycemia 10/04/2017  . Advanced stage glaucoma 10/04/2017  . Hypertension, benign 07/06/2017  . PAD (peripheral artery  disease) ([place]) 10/19/2016  . Osteoporosis of femur without pathological fracture 11/25/2015  . AAA (abdominal aortic aneurysm) without rupture ([place]) 11/05/2014  . Abnormal ECG 11/05/2014  . At risk for falling 11/05/2014  . DD (diverticular disease) 11/05/2014  . Dyslipidemia 11/05/2014  . Failure of erection 11/05/2014  . Degenerative arthritis of lumbar spine 11/05/2014  . Compressed spine fracture ([place]) 11/05/2014  . Basal cell papilloma 11/05/2014  . Vitamin D deficiency 11/05/2014    Past Surgical History:  Procedure Laterality Date  . CATARACT EXTRACTION Bilateral   . HERNIA REPAIR  June 2019  . INGUINAL HERNIA REPAIR Right 01/27/2018   Medium Ultra Pro mesh.  Surgeon: Robert Bellow, MD;  Location: [facility] ORS;  Service: General;  Laterality: Right;  with mesh  . TOOTH EXTRACTION      Family History  Problem Relation Age of Onset  . Cancer Mother   . Hypertension Mother   . Diabetes Father   . Hyperlipidemia Father   . Diabetes Maternal Grandmother   . Diabetes Brother   . Kidney disease Brother        on dialysis  . Diabetes Sister   . Kidney disease Sister        dialysis  . Cancer Brother        esophageal  . Leukemia Sister     Social History   Tobacco Use  . Smoking status: Former Smoker  Packs/day: 1.00    Years: 34.00    Pack years: 34.00    Types: Cigarettes    Start date: 08/02/1945    Quit date: 05/26/1980    Years since quitting: 39.4  . Smokeless tobacco: Never Used  . Tobacco comment: smoking cessation materials not required  Substance Use Topics  . Alcohol use: No    Alcohol/week: 0.0 standard drinks     Current Outpatient Medications:  .  acetaminophen (TYLENOL) 500 MG tablet, Take 500 mg by mouth at bedtime., Disp: , Rfl:  .  alendronate (FOSAMAX) 70 MG tablet, TAKE 1 TABLET EVERY MONDAY. TAKE WITH A FULL GLASS OF WATER ON AN EMPTY STOMACH., Disp: 12 tablet, Rfl: 3 .  aspirin EC 81 MG tablet, Take 81 mg by mouth at bedtime., Disp:  , Rfl:  .  Carboxymethylcellulose Sodium (LUBRICANT EYE DROPS PF OP), Place 1 drop into both eyes 3 (three) times daily as needed (for dry eyes.). , Disp: , Rfl:  .  cholecalciferol (VITAMIN D) 1000 UNITS tablet, Take 1,000 Units by mouth daily. , Disp: , Rfl:  .  donepezil (ARICEPT) 10 MG tablet, TAKE 1 TABLET (10 MG TOTAL) BY MOUTH AT BEDTIME., Disp: 90 tablet, Rfl: 1 .  finasteride (PROSCAR) 5 MG tablet, Take 1 tablet (5 mg total) by mouth daily., Disp: 90 tablet, Rfl: 3 .  fluticasone (FLONASE) 50 MCG/ACT nasal spray, USE 2 SPRAYS INTO BOTH NOSTRILS DAILY., Disp: 48 g, Rfl: 1 .  latanoprost (XALATAN) 0.005 % ophthalmic solution, Place 1 drop into both eyes at bedtime. , Disp: , Rfl:  .  loratadine (CLARITIN) 10 MG tablet, Take 1 tablet (10 mg total) by mouth daily., Disp: 90 tablet, Rfl: 1 .  Menthol-Methyl Salicylate (MUSCLE RUB EX), Apply 1 application topically 4 (four) times daily as needed (for pain.). , Disp: , Rfl:  .  simvastatin (ZOCOR) 5 MG tablet, TAKE 1 TABLET AT BEDTIME, Disp: 90 tablet, Rfl: 1 .  tamsulosin (FLOMAX) 0.4 MG CAPS capsule, Take 1 capsule (0.4 mg total) by mouth daily., Disp: 90 capsule, Rfl: 3 .  timolol (TIMOPTIC) 0.5 % ophthalmic solution, Place 1 drop into both eyes daily. , Disp: , Rfl:  .  vitamin B-12 (CYANOCOBALAMIN) 1000 MCG tablet, Take 1,000 mcg by mouth daily. , Disp: , Rfl:   No Known Allergies  I personally reviewed active problem list, medication list, allergies, family history, social history, health maintenance with the patient/caregiver today.   ROS  Ten systems reviewed and is negative except as mentioned in HPI  Per daughter he is always off balance, now using a walker, did not renew his license and no longer driving. Seems anxious when alone   Objective  Vitals:   11/14/19 1043  BP: (!) 110/56  Pulse: 74  Resp: 16  Temp: (!) 97.5 F (36.4 C)  TempSrc: Temporal  SpO2: 94%  Weight: 172 lb 1.6 oz (78.1 kg)  Height: 5\' 7"  (1.702 m)     Body mass index is 26.95 kg/m.  Physical Exam  Constitutional: Patient appears frail,  No distress.  HEENT: head atraumatic, normocephalic, pupils equal and reactive to light Cardiovascular: Normal rate, regular rhythm and normal heart sounds.  No murmur heard. No BLE edema. Pulmonary/Chest: Effort normal and breath sounds normal. No respiratory distress. Abdominal: Soft.  There is no tenderness. Muscular skeletal: using a walker , he has atrophy left hand, discussed with daughter, he has numbness but treatment can cause side effects, we will monitor for now Psychiatric:  Patient has a normal mood and affect. behavior is normal. Judgment and thought content normal.  PHQ2/9: Depression screen [facility] 2/9 11/14/2019 11/13/2019 08/30/2019 05/30/2019 04/12/2019  Decreased Interest 0 0 0 0 0  Down, Depressed, Hopeless 0 0 0 0 0  PHQ - 2 Score 0 0 0 0 0  Altered sleeping 0 - 0 0 0  Tired, decreased energy 0 - 0 0 0  Change in appetite 0 - 0 0 0  Feeling bad or failure about yourself  0 - 0 0 0  Trouble concentrating 0 - 0 0 0  Moving slowly or fidgety/restless 0 - 0 0 0  Suicidal thoughts 0 - 0 0 0  PHQ-9 Score 0 - 0 0 0  Difficult doing work/chores - - Not difficult at all Not difficult at all -  Some recent data might be hidden    phq 9 is negative   Fall Risk: Fall Risk  11/14/2019 11/13/2019 08/30/2019 05/30/2019 10/10/2018  Falls in the past year? 0 1 1 1  0  Number falls in past yr: 0 1 0 1 0  Injury with Fall? 0 0 1 1 0  Risk Factor Category  - - - - -  Risk for fall due to : - Impaired balance/gait;Other (Comment) Impaired balance/gait - -  Risk for fall due to: Comment - cognitive impairment - - -  Follow up - Falls prevention discussed - - -    Functional Status Survey: Is the patient deaf or have difficulty hearing?: Yes Does the patient have difficulty seeing, even when wearing glasses/contacts?: Yes Does the patient have difficulty concentrating, remembering, or making  decisions?: Yes Does the patient have difficulty walking or climbing stairs?: Yes Does the patient have difficulty dressing or bathing?: Yes Does the patient have difficulty doing errands alone such as visiting a doctor's office or shopping?: Yes    Assessment & Plan  1. Persistent cognitive impairment  - Ambulatory referral to Home Health - memantine (NAMENDA) 5 MG tablet; Take 1 tablet (5 mg total) by mouth 2 (two) times daily.  Dispense: 180 tablet; Refill: 1  2. Pulmonary emphysema, unspecified emphysema type ([place])  stable  3. Hypotension, unspecified hypotension type  bp is better today   4. Dyslipidemia  He is 89 and not on medication   5. History of diabetes mellitus   6. PAD (peripheral artery disease) ([place])  Does not seem to have claudication   7. Senile purpura ([facility])  Both arms, stable  8. B12 deficiency  Taking supplementation   9. Recurrent falls  - Ambulatory referral to [place]

## 2019-11-15 ENCOUNTER — Ambulatory Visit: Payer: Self-pay | Admitting: *Deleted

## 2019-11-15 DIAGNOSIS — R4189 Other symptoms and signs involving cognitive functions and awareness: Secondary | ICD-10-CM

## 2019-11-15 DIAGNOSIS — I10 Essential (primary) hypertension: Secondary | ICD-10-CM

## 2019-11-16 NOTE — Chronic Care Management (AMB) (Signed)
Chronic Care Management    Clinical Social Work General Note  11/16/2019 Name: Roy Hernandez MRN: [id number] DOB: 09/26/1927  Roy Hernandez is a 86 y.o. year old male who is a primary care patient of Roy Sizer, MD. The CCM was consulted to assist the patient with [employer] .   [name] was given information about Chronic Care Management services today including:  1. CCM service includes personalized support from designated clinical staff supervised by his physician, including individualized plan of care and coordination with other care providers 2. 24/7 contact phone numbers for assistance for urgent and routine care needs. 3. Service will only be billed when office clinical staff spend 20 minutes or more in a month to coordinate care. 4. Only one practitioner may furnish and bill the service in a calendar month. 5. The patient may stop CCM services at any time (effective at the end of the month) by phone call to the office staff. 6. The patient will be responsible for cost sharing (co-pay) of up to 20% of the service fee (after annual deductible is met).  Patient agreed to services and verbal consent obtained. Consent received from patient's daughter-patient diagnosed with Dementia  Review of patient status, including review of consultants reports, relevant laboratory and other test results, and collaboration with appropriate care team members and the patient's provider was performed as part of comprehensive patient evaluation and provision of chronic care management services.    SDOH (Social Determinants of Health) assessments and interventions performed:  Yes    Outpatient Encounter Medications as of 11/15/2019  Medication Sig  . acetaminophen (TYLENOL) 500 MG tablet Take 500 mg by mouth at bedtime.  [name] alendronate (FOSAMAX) 70 MG tablet TAKE 1 TABLET EVERY MONDAY. TAKE WITH A FULL GLASS OF WATER ON AN EMPTY STOMACH.  [name] aspirin EC 81 MG tablet Take 81 mg by mouth at bedtime.   . Carboxymethylcellulose Sodium (LUBRICANT EYE DROPS PF OP) Place 1 drop into both eyes 3 (three) times daily as needed (for dry eyes.).   [name] cholecalciferol (VITAMIN D) 1000 UNITS tablet Take 1,000 Units by mouth daily.   [name] donepezil (ARICEPT) 10 MG tablet TAKE 1 TABLET (10 MG TOTAL) BY MOUTH AT BEDTIME.  . finasteride (PROSCAR) 5 MG tablet Take 1 tablet (5 mg total) by mouth daily.  . fluticasone (FLONASE) 50 MCG/ACT nasal spray USE 2 SPRAYS INTO BOTH NOSTRILS DAILY.  [name] latanoprost (XALATAN) 0.005 % ophthalmic solution Place 1 drop into both eyes at bedtime.   [name] loratadine (CLARITIN) 10 MG tablet Take 1 tablet (10 mg total) by mouth daily.  . memantine (NAMENDA) 5 MG tablet Take 1 tablet (5 mg total) by mouth 2 (two) times daily.  . Menthol-Methyl Salicylate (MUSCLE RUB EX) Apply 1 application topically 4 (four) times daily as needed (for pain.).   [name] simvastatin (ZOCOR) 5 MG tablet TAKE 1 TABLET AT BEDTIME  . tamsulosin (FLOMAX) 0.4 MG CAPS capsule Take 1 capsule (0.4 mg total) by mouth daily.  . timolol (TIMOPTIC) 0.5 % ophthalmic solution Place 1 drop into both eyes daily.   . vitamin B-12 (CYANOCOBALAMIN) 1000 MCG tablet Take 1,000 mcg by mouth daily.    No facility-administered encounter medications on file as of 11/15/2019.    Goals Addressed            This Visit's Progress   . "I need resources to help manage by father's condition" (pt-stated)       CARE PLAN ENTRY (see longitudinal  plan of care for additional care plan information)  Current Barriers:  . Lacks knowledge of community resource: related to Dementia Support  Clinical Social Work Clinical Goal(s):  [name] Over the next 90 days, patient will work with SW to increase knowledge of community resources and support related to managing patient's Dementia  Interventions: . Patient interviewed patient's daughter and appropriate assessments performed . Explored current community resource needs . Confirmed that patient's  daughter that she would like to keep patient home as long as possible and has the information from his insurance company to obtain a life alert. Hand rails have also been placed on the back door . Patient's daughter confirmed that patient is rarely left alone and that a family member is there to check on him regularly . Patient's daughter manages patient's money and pays his household bills . Provided patient's daughter with information about a caregiver resource fair . Confirmed plan to send patient's daughter additional resource information for Dementia support to her home address [address], [place] [address] . Discussed plans with patient for ongoing care management follow up and provided patient with direct contact information for care management team . Provided education to patient/caregiver regarding level of care options.  Patient Self Care Activities:  . Attends all scheduled provider appointments . Knowledge deficit related to Dementia Support Resources  Initial goal documentation         Follow Up Plan: SW will follow up with patient by phone over the next 7-14 business days       [place], [place] Worker  [facility] Care Management [phone]

## 2019-11-16 NOTE — Patient Instructions (Addendum)
Thank you allowing the Chronic Care Management Team to be a part of your care! It was a pleasure speaking with you today!  1. Please call this social worker with any questions or concerns regarding patient's community resource needs  CCM (Chronic Care Management) Team   Neldon Labella RN, BSN Nurse Care Coordinator  [phone]  [place], LCSW Clinical Social Worker [phone]  Goals Addressed            This Visit's Progress   . "I need resources to help manage by father's condition" (pt-stated)       CARE PLAN ENTRY (see longitudinal plan of care for additional care plan information)  Current Barriers:  . Lacks knowledge of community resource: related to Dementia Support  Clinical Social Work Clinical Goal(s):  [name] Over the next 90 days, patient will work with SW to increase knowledge of community resources and support related to managing patient's Dementia  Interventions: . Patient interviewed patient's daughter and appropriate assessments performed . Explored current community resource needs . Confirmed that patient's daughter that she would like to keep patient home as long as possible and has the information from his insurance company to obtain a life alert. Hand rails have also been placed on the back door . Patient's daughter confirmed that patient is rarely left alone and that a family member is there to check on him regularly . Patient's daughter manages patient's money and pays his household bills . Provided patient's daughter with information about a caregiver resource fair . Confirmed plan to send patient's daughter additional resource information for Dementia support . Discussed plans with patient for ongoing care management follow up and provided patient with direct contact information for care management team . Provided education to patient/caregiver regarding level of care options.  Patient Self Care Activities:  . Attends all scheduled provider  appointments . Knowledge deficit related to Dementia Support Resources  Initial goal documentation         The patient verbalized understanding of instructions provided today and declined a print copy of patient instruction materials.   Telephone follow up appointment with care management team member scheduled for: 11/22/19

## 2019-11-20 ENCOUNTER — Other Ambulatory Visit: Payer: Self-pay | Admitting: Family Medicine

## 2019-11-22 ENCOUNTER — Telehealth: Payer: Medicare HMO

## 2019-11-22 ENCOUNTER — Ambulatory Visit: Payer: Self-pay | Admitting: *Deleted

## 2019-11-22 DIAGNOSIS — R4189 Other symptoms and signs involving cognitive functions and awareness: Secondary | ICD-10-CM

## 2019-11-22 DIAGNOSIS — E119 Type 2 diabetes mellitus without complications: Secondary | ICD-10-CM | POA: Diagnosis not present

## 2019-11-22 DIAGNOSIS — I10 Essential (primary) hypertension: Secondary | ICD-10-CM

## 2019-11-22 LAB — HM DIABETES EYE EXAM

## 2019-11-22 NOTE — Chronic Care Management (AMB) (Signed)
  Chronic Care Management   Social Work Note  11/22/2019 Name: Roy Hernandez MRN: [id number] DOB: Mar 29, 1928  Roy Hernandez is a 86 y.o. year old male who sees [name], MD for primary care. The CCM team was consulted for assistance with Level of Care Concerns.   Educational material regarding Alzheimer's Disease mailed to patient's daughter for review as well as Scientist, water quality for [employer].  SDOH (Social Determinants of Health) assessments performed: No     Outpatient Encounter Medications as of 11/22/2019  Medication Sig  . acetaminophen (TYLENOL) 500 MG tablet Take 500 mg by mouth at bedtime.  [name] alendronate (FOSAMAX) 70 MG tablet TAKE 1 TABLET EVERY MONDAY. TAKE WITH A FULL GLASS OF WATER ON AN EMPTY STOMACH.  [name] aspirin EC 81 MG tablet Take 81 mg by mouth at bedtime.  . Carboxymethylcellulose Sodium (LUBRICANT EYE DROPS PF OP) Place 1 drop into both eyes 3 (three) times daily as needed (for dry eyes.).   [name] cholecalciferol (VITAMIN D) 1000 UNITS tablet Take 1,000 Units by mouth daily.   [name] donepezil (ARICEPT) 10 MG tablet TAKE 1 TABLET (10 MG TOTAL) BY MOUTH AT BEDTIME.  . finasteride (PROSCAR) 5 MG tablet Take 1 tablet (5 mg total) by mouth daily.  . fluticasone (FLONASE) 50 MCG/ACT nasal spray USE 2 SPRAYS INTO BOTH NOSTRILS DAILY.  [name] latanoprost (XALATAN) 0.005 % ophthalmic solution Place 1 drop into both eyes at bedtime.   [name] loratadine (CLARITIN) 10 MG tablet Take 1 tablet (10 mg total) by mouth daily.  . memantine (NAMENDA) 5 MG tablet Take 1 tablet (5 mg total) by mouth 2 (two) times daily.  . Menthol-Methyl Salicylate (MUSCLE RUB EX) Apply 1 application topically 4 (four) times daily as needed (for pain.).   [name] simvastatin (ZOCOR) 5 MG tablet TAKE 1 TABLET AT BEDTIME  . tamsulosin (FLOMAX) 0.4 MG CAPS capsule Take 1 capsule (0.4 mg total) by mouth daily.  . timolol (TIMOPTIC) 0.5 % ophthalmic solution Place 1 drop into both eyes daily.   . vitamin B-12  (CYANOCOBALAMIN) 1000 MCG tablet Take 1,000 mcg by mouth daily.    No facility-administered encounter medications on file as of 11/22/2019.    Goals Addressed            This Visit's Progress   . "I need resources to help manage by father's condition" (pt-stated)       CARE PLAN ENTRY (see longitudinal plan of care for additional care plan information)  Current Barriers:  . Lacks knowledge of community resource: related to Dementia Support  Clinical Social Work Clinical Goal(s):  [name] Over the next 90 days, patient will work with SW to increase knowledge of community resources and support related to managing patient's Dementia  Interventions: . Sent patient's daughter additional resource information for Dementia support to her [place] [address], [place] [address] Patient Self Care Activities:  . Attends all scheduled provider appointments . Knowledge deficit related to Dementia Support Resources  Please see past updates related to this goal by clicking on the "Past Updates" button in the selected goal         Follow Up Plan: SW will follow up with patient by phone over the next 7-10 business days  Elliot Gurney, [place] Worker  [facility] Care Management [phone]

## 2019-11-22 NOTE — Patient Instructions (Addendum)
Thank you allowing the Chronic Care Management Team to be a part of your care! It was a pleasure speaking with you today!  1. Please review resource information once received and call this Education officer, museum with any questions or concerns  CCM (Chronic Care Management) Team   Neldon Labella  RN, BSN Nurse Care Coordinator  [phone]   [place], LCSW Clinical Social Worker [phone]  Goals Addressed            This Visit's Progress   . "I need resources to help manage by father's condition" (pt-stated)       CARE PLAN ENTRY (see longitudinal plan of care for additional care plan information)  Current Barriers:  . Lacks knowledge of community resource: related to Dementia Support  Clinical Social Work Clinical Goal(s):  [name] Over the next 90 days, patient will work with SW to increase knowledge of community resources and support related to managing patient's Dementia  Interventions: . Sent patient's daughter additional resource information for Dementia support to her [place] [address], [place] [address] Patient Self Care Activities:  . Attends all scheduled provider appointments . Knowledge deficit related to Dementia Support Resources  Please see past updates related to this goal by clicking on the "Past Updates" button in the selected goal          The patient verbalized understanding of instructions provided today and declined a print copy of patient instruction materials.   Telephone follow up appointment with care management team member scheduled for: 11/27/19

## 2019-11-23 DIAGNOSIS — E1151 Type 2 diabetes mellitus with diabetic peripheral angiopathy without gangrene: Secondary | ICD-10-CM | POA: Diagnosis not present

## 2019-11-23 DIAGNOSIS — E1140 Type 2 diabetes mellitus with diabetic neuropathy, unspecified: Secondary | ICD-10-CM | POA: Diagnosis not present

## 2019-11-23 DIAGNOSIS — I10 Essential (primary) hypertension: Secondary | ICD-10-CM | POA: Diagnosis not present

## 2019-11-23 DIAGNOSIS — J439 Emphysema, unspecified: Secondary | ICD-10-CM | POA: Diagnosis not present

## 2019-11-23 DIAGNOSIS — M47817 Spondylosis without myelopathy or radiculopathy, lumbosacral region: Secondary | ICD-10-CM | POA: Diagnosis not present

## 2019-11-23 DIAGNOSIS — E559 Vitamin D deficiency, unspecified: Secondary | ICD-10-CM | POA: Diagnosis not present

## 2019-11-23 DIAGNOSIS — D649 Anemia, unspecified: Secondary | ICD-10-CM | POA: Diagnosis not present

## 2019-11-23 DIAGNOSIS — M1990 Unspecified osteoarthritis, unspecified site: Secondary | ICD-10-CM | POA: Diagnosis not present

## 2019-11-23 DIAGNOSIS — M810 Age-related osteoporosis without current pathological fracture: Secondary | ICD-10-CM | POA: Diagnosis not present

## 2019-11-26 DIAGNOSIS — E1140 Type 2 diabetes mellitus with diabetic neuropathy, unspecified: Secondary | ICD-10-CM | POA: Diagnosis not present

## 2019-11-26 DIAGNOSIS — E1151 Type 2 diabetes mellitus with diabetic peripheral angiopathy without gangrene: Secondary | ICD-10-CM | POA: Diagnosis not present

## 2019-11-26 DIAGNOSIS — E559 Vitamin D deficiency, unspecified: Secondary | ICD-10-CM | POA: Diagnosis not present

## 2019-11-26 DIAGNOSIS — D649 Anemia, unspecified: Secondary | ICD-10-CM | POA: Diagnosis not present

## 2019-11-26 DIAGNOSIS — M810 Age-related osteoporosis without current pathological fracture: Secondary | ICD-10-CM | POA: Diagnosis not present

## 2019-11-26 DIAGNOSIS — J439 Emphysema, unspecified: Secondary | ICD-10-CM | POA: Diagnosis not present

## 2019-11-26 DIAGNOSIS — M47817 Spondylosis without myelopathy or radiculopathy, lumbosacral region: Secondary | ICD-10-CM | POA: Diagnosis not present

## 2019-11-26 DIAGNOSIS — I10 Essential (primary) hypertension: Secondary | ICD-10-CM | POA: Diagnosis not present

## 2019-11-26 DIAGNOSIS — M1990 Unspecified osteoarthritis, unspecified site: Secondary | ICD-10-CM | POA: Diagnosis not present

## 2019-11-27 ENCOUNTER — Telehealth: Payer: Self-pay | Admitting: Family Medicine

## 2019-11-27 ENCOUNTER — Ambulatory Visit: Payer: Medicare HMO | Admitting: *Deleted

## 2019-11-27 DIAGNOSIS — M810 Age-related osteoporosis without current pathological fracture: Secondary | ICD-10-CM | POA: Diagnosis not present

## 2019-11-27 DIAGNOSIS — J439 Emphysema, unspecified: Secondary | ICD-10-CM | POA: Diagnosis not present

## 2019-11-27 DIAGNOSIS — E559 Vitamin D deficiency, unspecified: Secondary | ICD-10-CM | POA: Diagnosis not present

## 2019-11-27 DIAGNOSIS — D649 Anemia, unspecified: Secondary | ICD-10-CM | POA: Diagnosis not present

## 2019-11-27 DIAGNOSIS — I10 Essential (primary) hypertension: Secondary | ICD-10-CM | POA: Diagnosis not present

## 2019-11-27 DIAGNOSIS — E1151 Type 2 diabetes mellitus with diabetic peripheral angiopathy without gangrene: Secondary | ICD-10-CM | POA: Diagnosis not present

## 2019-11-27 DIAGNOSIS — M1990 Unspecified osteoarthritis, unspecified site: Secondary | ICD-10-CM | POA: Diagnosis not present

## 2019-11-27 DIAGNOSIS — M47817 Spondylosis without myelopathy or radiculopathy, lumbosacral region: Secondary | ICD-10-CM | POA: Diagnosis not present

## 2019-11-27 DIAGNOSIS — R4189 Other symptoms and signs involving cognitive functions and awareness: Secondary | ICD-10-CM

## 2019-11-27 DIAGNOSIS — E1140 Type 2 diabetes mellitus with diabetic neuropathy, unspecified: Secondary | ICD-10-CM | POA: Diagnosis not present

## 2019-11-27 NOTE — Telephone Encounter (Signed)
Copied from [place] [id number]. Topic: General - Other >> Nov 27, 2019 11:07 AM Keene Breath wrote: Reason for CRM: Verbal orders for OT - 1xwk 5,  CB# [phone]

## 2019-11-27 NOTE — Patient Instructions (Addendum)
Thank you allowing the Chronic Care Management Team to be a part of your care! It was a pleasure speaking with you today!  1. Please review community resources provided and call this Education officer, museum with any questions or concerns  CCM (Chronic Care Management) Team   Neldon Labella RN, BSN Nurse Care Coordinator  [phone]  [place], LCSW Clinical Social Worker [phone]  Goals Addressed            This Visit's Progress   . "I need resources to help manage by father's condition" (pt-stated)       CARE PLAN ENTRY (see longitudinal plan of care for additional care plan information)  Current Barriers:  . Lacks knowledge of community resource: related to Dementia Support  Clinical Social Work Clinical Goal(s):  [name] Over the next 90 days, patient will work with SW to increase knowledge of community resources and support related to managing patient's Dementia  Interventions: . Confirmed with patient's daughter additional resource information for Dementia support was sent to her [place] address [address], [place] [address] . Confirmed that [place] has started for patient-OT started yesterday, PT today . Confirmed no additional community resource needs at this time . Confirmed that this social worker will follow up with daughter upon her receipt of the resources provided to address any additional questions or care needs Patient Self Care Activities:  . Attends all scheduled provider appointments . Knowledge deficit related to Dementia Support Resources  Please see past updates related to this goal by clicking on the "Past Updates" button in the selected goal          The patient verbalized understanding of instructions provided today and declined a print copy of patient instruction materials.   Telephone follow up appointment with care management team member scheduled for:  12/11/19

## 2019-11-27 NOTE — Chronic Care Management (AMB) (Signed)
Chronic Care Management    Clinical Social Work Follow Up Note  11/27/2019 Name: Roy Hernandez MRN: [id number] DOB: 1928/06/14  Roy Hernandez is a 86 y.o. year old male who is a primary care patient of [name], MD. The CCM team was consulted for assistance with [employer] .   Review of patient status, including review of consultants reports, other relevant assessments, and collaboration with appropriate care team members and the patient's provider was performed as part of comprehensive patient evaluation and provision of chronic care management services.    SDOH (Social Determinants of Health) assessments performed: No    Outpatient Encounter Medications as of 11/27/2019  Medication Sig  . acetaminophen (TYLENOL) 500 MG tablet Take 500 mg by mouth at bedtime.  [name] alendronate (FOSAMAX) 70 MG tablet TAKE 1 TABLET EVERY MONDAY. TAKE WITH A FULL GLASS OF WATER ON AN EMPTY STOMACH.  [name] aspirin EC 81 MG tablet Take 81 mg by mouth at bedtime.  . Carboxymethylcellulose Sodium (LUBRICANT EYE DROPS PF OP) Place 1 drop into both eyes 3 (three) times daily as needed (for dry eyes.).   [name] cholecalciferol (VITAMIN D) 1000 UNITS tablet Take 1,000 Units by mouth daily.   [name] donepezil (ARICEPT) 10 MG tablet TAKE 1 TABLET (10 MG TOTAL) BY MOUTH AT BEDTIME.  . finasteride (PROSCAR) 5 MG tablet Take 1 tablet (5 mg total) by mouth daily.  . fluticasone (FLONASE) 50 MCG/ACT nasal spray USE 2 SPRAYS INTO BOTH NOSTRILS DAILY.  [name] latanoprost (XALATAN) 0.005 % ophthalmic solution Place 1 drop into both eyes at bedtime.   [name] loratadine (CLARITIN) 10 MG tablet Take 1 tablet (10 mg total) by mouth daily.  . memantine (NAMENDA) 5 MG tablet Take 1 tablet (5 mg total) by mouth 2 (two) times daily.  . Menthol-Methyl Salicylate (MUSCLE RUB EX) Apply 1 application topically 4 (four) times daily as needed (for pain.).   [name] simvastatin (ZOCOR) 5 MG tablet TAKE 1 TABLET AT BEDTIME  . tamsulosin (FLOMAX) 0.4 MG CAPS  capsule Take 1 capsule (0.4 mg total) by mouth daily.  . timolol (TIMOPTIC) 0.5 % ophthalmic solution Place 1 drop into both eyes daily.   . vitamin B-12 (CYANOCOBALAMIN) 1000 MCG tablet Take 1,000 mcg by mouth daily.    No facility-administered encounter medications on file as of 11/27/2019.     Goals Addressed            This Visit's Progress   . "I need resources to help manage by father's condition" (pt-stated)       CARE PLAN ENTRY (see longitudinal plan of care for additional care plan information)  Current Barriers:  . Lacks knowledge of community resource: related to Dementia Support  Clinical Social Work Clinical Goal(s):  [name] Over the next 90 days, patient will work with SW to increase knowledge of community resources and support related to managing patient's Dementia  Interventions: . Confirmed with patient's daughter additional resource information for Dementia support was [place] [address] [place] [address] [place], [place] [address] . Confirmed that [place] has started for patient-OT started yesterday, PT today . Confirmed no additional community resource needs at this time . Confirmed that this social worker will follow up with daughter upon her receipt of the resources provided to address any additional questions or care needs Patient Self Care Activities:  . Attends all scheduled provider appointments . Knowledge deficit related to Dementia Support Resources  Please see past updates related to this goal by clicking  on the "Past Updates" button in the selected goal          Follow Up Plan: SW will follow up with patient by phone over the next 2 weeks   [place], [place] Worker  [facility] [phone]

## 2019-11-28 ENCOUNTER — Ambulatory Visit: Payer: Medicare HMO | Admitting: Family Medicine

## 2019-11-29 ENCOUNTER — Encounter: Payer: Self-pay | Admitting: Urology

## 2019-11-29 ENCOUNTER — Other Ambulatory Visit: Payer: Self-pay

## 2019-11-29 ENCOUNTER — Ambulatory Visit: Payer: Medicare HMO | Admitting: Urology

## 2019-11-29 VITALS — BP 85/50 | HR 100 | Ht 67.0 in | Wt 172.5 lb

## 2019-11-29 DIAGNOSIS — R339 Retention of urine, unspecified: Secondary | ICD-10-CM | POA: Diagnosis not present

## 2019-11-29 DIAGNOSIS — N401 Enlarged prostate with lower urinary tract symptoms: Secondary | ICD-10-CM | POA: Diagnosis not present

## 2019-11-29 DIAGNOSIS — R3914 Feeling of incomplete bladder emptying: Secondary | ICD-10-CM

## 2019-11-29 LAB — BLADDER SCAN AMB NON-IMAGING

## 2019-11-29 MED ORDER — FINASTERIDE 5 MG PO TABS: 5.0000 mg | ORAL_TABLET | Freq: Every day | ORAL | 3 refills | Status: AC

## 2019-11-29 MED ORDER — TAMSULOSIN HCL 0.4 MG PO CAPS: 0.8000 mg | ORAL_CAPSULE | Freq: Every day | ORAL | 3 refills | Status: AC

## 2019-11-29 NOTE — Progress Notes (Signed)
   11/29/2019 9:30 AM   [name] 09-12-1927 [id number]  Reason for visit: Follow up BPH and chronic incomplete emptying  HPI: I saw Mr. Andrade and his daughter in urology clinic today for follow-up of BPH with chronic incomplete bladder emptying.  Briefly, he is a 86 year old male with dementia who has a long history of weak stream and feeling of incomplete emptying.  When I originally saw him he was having urgency, frequency, nocturia 4-5 times per night, and PVR was 350 mL.  We started him on Flomax and ultimately finasteride and I recommended considering urodynamics.  In the setting the COVID-19 pandemic he never followed up for urodynamics and his symptoms had mostly resolved.  PVRs have ranged from 250-400 in the past.  Per his daughter, his dementia has worsened significantly over the last 6 to 12 months.  His renal function continues to be normal.  A renal ultrasound performed earlier this month showed no evidence of hydronephrosis and the bladder was not particularly distended.  He denies any incontinence, history of retention, or history of UTIs.  Most of the history is obtained from the daughter today.  PVR in clinic today 413 mL.   Physical Exam: BP (!) 85/50 (BP Location: Left Arm, Patient Position: Sitting, Cuff Size: Normal)   Pulse 100   Ht 5\' 7"  (1.702 m)   Wt 172 lb 8 oz (78.2 kg)   BMI 27.02 kg/m    Constitutional: Pleasantly confused GU: No CVA tenderness  Laboratory Data: Reviewed, see HPI  Pertinent Imaging: I have personally reviewed the renal ultrasound, see HPI  Assessment & Plan:   In summary, he is a very frail and demented 86 year old male with BPH and incomplete bladder emptying with moderate symptom control on 0.4 mg Flomax and 8 months of finasteride.  We discussed other options at length including urodynamics and possible surgery, however I think he would have a high rate of incontinence after any outlet procedure even if he had a functional bladder.   We discussed alternatives including intermittent catheterization or chronic Foley catheter, his daughter does not think he would tolerate either of these well.  They would like to continue maximal medical therapy with increased dose of Flomax to 0.8 mg nightly and finasteride 5 mg daily.  We discussed the risks and benefits at length.  We discussed return precautions at length including urinary retention, gross hematuria, or UTIs.  Flomax increased to 0.8 mg nightly, continue finasteride.  Both refilled Return precautions discussed at length RTC 9 months with PVR, sooner if problems  I spent 30 total minutes on the day of the encounter including pre-visit review of the medical record, face-to-face time with the patient, and post visit ordering of labs/imaging/tests.  [place], [facility] [address], [place], [place] [address] [phone]

## 2019-11-29 NOTE — Patient Instructions (Signed)
1. Try to urinate every 2 hours during the day 2. Call the clinic or present to the ED if unable to urinate with lower abdominal pain

## 2019-11-30 DIAGNOSIS — I10 Essential (primary) hypertension: Secondary | ICD-10-CM | POA: Diagnosis not present

## 2019-11-30 DIAGNOSIS — M47817 Spondylosis without myelopathy or radiculopathy, lumbosacral region: Secondary | ICD-10-CM | POA: Diagnosis not present

## 2019-11-30 DIAGNOSIS — M1990 Unspecified osteoarthritis, unspecified site: Secondary | ICD-10-CM | POA: Diagnosis not present

## 2019-11-30 DIAGNOSIS — J439 Emphysema, unspecified: Secondary | ICD-10-CM | POA: Diagnosis not present

## 2019-11-30 DIAGNOSIS — E1140 Type 2 diabetes mellitus with diabetic neuropathy, unspecified: Secondary | ICD-10-CM | POA: Diagnosis not present

## 2019-11-30 DIAGNOSIS — E1151 Type 2 diabetes mellitus with diabetic peripheral angiopathy without gangrene: Secondary | ICD-10-CM | POA: Diagnosis not present

## 2019-11-30 DIAGNOSIS — E559 Vitamin D deficiency, unspecified: Secondary | ICD-10-CM | POA: Diagnosis not present

## 2019-11-30 DIAGNOSIS — M810 Age-related osteoporosis without current pathological fracture: Secondary | ICD-10-CM | POA: Diagnosis not present

## 2019-11-30 DIAGNOSIS — D649 Anemia, unspecified: Secondary | ICD-10-CM | POA: Diagnosis not present

## 2019-12-03 DIAGNOSIS — E1140 Type 2 diabetes mellitus with diabetic neuropathy, unspecified: Secondary | ICD-10-CM | POA: Diagnosis not present

## 2019-12-03 DIAGNOSIS — J439 Emphysema, unspecified: Secondary | ICD-10-CM | POA: Diagnosis not present

## 2019-12-03 DIAGNOSIS — E1151 Type 2 diabetes mellitus with diabetic peripheral angiopathy without gangrene: Secondary | ICD-10-CM | POA: Diagnosis not present

## 2019-12-03 DIAGNOSIS — E559 Vitamin D deficiency, unspecified: Secondary | ICD-10-CM | POA: Diagnosis not present

## 2019-12-03 DIAGNOSIS — M1990 Unspecified osteoarthritis, unspecified site: Secondary | ICD-10-CM | POA: Diagnosis not present

## 2019-12-03 DIAGNOSIS — M810 Age-related osteoporosis without current pathological fracture: Secondary | ICD-10-CM | POA: Diagnosis not present

## 2019-12-03 DIAGNOSIS — D649 Anemia, unspecified: Secondary | ICD-10-CM | POA: Diagnosis not present

## 2019-12-03 DIAGNOSIS — I10 Essential (primary) hypertension: Secondary | ICD-10-CM | POA: Diagnosis not present

## 2019-12-03 DIAGNOSIS — M47817 Spondylosis without myelopathy or radiculopathy, lumbosacral region: Secondary | ICD-10-CM | POA: Diagnosis not present

## 2019-12-04 DIAGNOSIS — M1990 Unspecified osteoarthritis, unspecified site: Secondary | ICD-10-CM | POA: Diagnosis not present

## 2019-12-04 DIAGNOSIS — E1140 Type 2 diabetes mellitus with diabetic neuropathy, unspecified: Secondary | ICD-10-CM | POA: Diagnosis not present

## 2019-12-04 DIAGNOSIS — E1151 Type 2 diabetes mellitus with diabetic peripheral angiopathy without gangrene: Secondary | ICD-10-CM | POA: Diagnosis not present

## 2019-12-04 DIAGNOSIS — E559 Vitamin D deficiency, unspecified: Secondary | ICD-10-CM | POA: Diagnosis not present

## 2019-12-04 DIAGNOSIS — M810 Age-related osteoporosis without current pathological fracture: Secondary | ICD-10-CM | POA: Diagnosis not present

## 2019-12-04 DIAGNOSIS — J439 Emphysema, unspecified: Secondary | ICD-10-CM | POA: Diagnosis not present

## 2019-12-04 DIAGNOSIS — D649 Anemia, unspecified: Secondary | ICD-10-CM | POA: Diagnosis not present

## 2019-12-04 DIAGNOSIS — M47817 Spondylosis without myelopathy or radiculopathy, lumbosacral region: Secondary | ICD-10-CM | POA: Diagnosis not present

## 2019-12-04 DIAGNOSIS — I10 Essential (primary) hypertension: Secondary | ICD-10-CM | POA: Diagnosis not present

## 2019-12-06 DIAGNOSIS — M810 Age-related osteoporosis without current pathological fracture: Secondary | ICD-10-CM | POA: Diagnosis not present

## 2019-12-06 DIAGNOSIS — E1140 Type 2 diabetes mellitus with diabetic neuropathy, unspecified: Secondary | ICD-10-CM | POA: Diagnosis not present

## 2019-12-06 DIAGNOSIS — E1151 Type 2 diabetes mellitus with diabetic peripheral angiopathy without gangrene: Secondary | ICD-10-CM | POA: Diagnosis not present

## 2019-12-06 DIAGNOSIS — M47817 Spondylosis without myelopathy or radiculopathy, lumbosacral region: Secondary | ICD-10-CM | POA: Diagnosis not present

## 2019-12-06 DIAGNOSIS — I10 Essential (primary) hypertension: Secondary | ICD-10-CM | POA: Diagnosis not present

## 2019-12-06 DIAGNOSIS — D649 Anemia, unspecified: Secondary | ICD-10-CM | POA: Diagnosis not present

## 2019-12-06 DIAGNOSIS — E559 Vitamin D deficiency, unspecified: Secondary | ICD-10-CM | POA: Diagnosis not present

## 2019-12-06 DIAGNOSIS — M1990 Unspecified osteoarthritis, unspecified site: Secondary | ICD-10-CM | POA: Diagnosis not present

## 2019-12-06 DIAGNOSIS — J439 Emphysema, unspecified: Secondary | ICD-10-CM | POA: Diagnosis not present

## 2019-12-07 DIAGNOSIS — M47817 Spondylosis without myelopathy or radiculopathy, lumbosacral region: Secondary | ICD-10-CM | POA: Diagnosis not present

## 2019-12-07 DIAGNOSIS — M810 Age-related osteoporosis without current pathological fracture: Secondary | ICD-10-CM | POA: Diagnosis not present

## 2019-12-07 DIAGNOSIS — E1151 Type 2 diabetes mellitus with diabetic peripheral angiopathy without gangrene: Secondary | ICD-10-CM | POA: Diagnosis not present

## 2019-12-07 DIAGNOSIS — M1990 Unspecified osteoarthritis, unspecified site: Secondary | ICD-10-CM | POA: Diagnosis not present

## 2019-12-07 DIAGNOSIS — E559 Vitamin D deficiency, unspecified: Secondary | ICD-10-CM | POA: Diagnosis not present

## 2019-12-07 DIAGNOSIS — D649 Anemia, unspecified: Secondary | ICD-10-CM | POA: Diagnosis not present

## 2019-12-07 DIAGNOSIS — J439 Emphysema, unspecified: Secondary | ICD-10-CM | POA: Diagnosis not present

## 2019-12-07 DIAGNOSIS — E1140 Type 2 diabetes mellitus with diabetic neuropathy, unspecified: Secondary | ICD-10-CM | POA: Diagnosis not present

## 2019-12-07 DIAGNOSIS — I10 Essential (primary) hypertension: Secondary | ICD-10-CM | POA: Diagnosis not present

## 2019-12-11 ENCOUNTER — Ambulatory Visit: Payer: Medicare HMO | Admitting: *Deleted

## 2019-12-11 DIAGNOSIS — E559 Vitamin D deficiency, unspecified: Secondary | ICD-10-CM | POA: Diagnosis not present

## 2019-12-11 DIAGNOSIS — M1990 Unspecified osteoarthritis, unspecified site: Secondary | ICD-10-CM | POA: Diagnosis not present

## 2019-12-11 DIAGNOSIS — M810 Age-related osteoporosis without current pathological fracture: Secondary | ICD-10-CM | POA: Diagnosis not present

## 2019-12-11 DIAGNOSIS — D649 Anemia, unspecified: Secondary | ICD-10-CM | POA: Diagnosis not present

## 2019-12-11 DIAGNOSIS — E1140 Type 2 diabetes mellitus with diabetic neuropathy, unspecified: Secondary | ICD-10-CM | POA: Diagnosis not present

## 2019-12-11 DIAGNOSIS — M47817 Spondylosis without myelopathy or radiculopathy, lumbosacral region: Secondary | ICD-10-CM | POA: Diagnosis not present

## 2019-12-11 DIAGNOSIS — R4189 Other symptoms and signs involving cognitive functions and awareness: Secondary | ICD-10-CM

## 2019-12-11 DIAGNOSIS — I10 Essential (primary) hypertension: Secondary | ICD-10-CM | POA: Diagnosis not present

## 2019-12-11 DIAGNOSIS — E1151 Type 2 diabetes mellitus with diabetic peripheral angiopathy without gangrene: Secondary | ICD-10-CM | POA: Diagnosis not present

## 2019-12-11 DIAGNOSIS — J439 Emphysema, unspecified: Secondary | ICD-10-CM | POA: Diagnosis not present

## 2019-12-11 NOTE — Patient Instructions (Signed)
Thank you allowing the Chronic Care Management Team to be a part of your care! It was a pleasure speaking with you today!  1. Please call this social worker with any questions or concerns regarding patient's community resource needs.  CCM (Chronic Care Management) Team   Neldon Labella RN, BSN Nurse Care Coordinator  [phone]  [place], LCSW Clinical Social Worker [phone]  Goals Addressed            This Visit's Progress   . "I need resources to help manage by father's condition" (pt-stated)       CARE PLAN ENTRY (see longitudinal plan of care for additional care plan information)  Current Barriers:  . Lacks knowledge of community resource: related to Dementia Support  Clinical Social Work Clinical Goal(s):  [name] Over the next 90 days, patient will work with SW to increase knowledge of community resources and support related to managing patient's Dementia  Interventions: . Confirmed with patient's daughter that  resource information for Dementia support was received . Confirmed plan that patient's daughter will make copies for family members . Per patient's daughter, patient's memory has improved with the medication change. He was also able to see family members over the weekend that he had not seen in years . Confirmed that [place] has started for patient-OT,PT and nursing-has been very helpful as well  . Confirmed that the family checks on patient daily (they have now developed a good routine) . Confirmed no additional community resource needs at this time and agrees to call this Education officer, museum with any questions or concerns in the future.  Patient Self Care Activities:  . Attends all scheduled provider appointments . Knowledge deficit related to Dementia Support Resources  Please see past updates related to this goal by clicking on the "Past Updates" button in the selected goal          The patient verbalized understanding of instructions provided today and  declined a print copy of patient instruction materials.   No further follow up required: patient's daughter to call this social worker with any community resource needs or concerns

## 2019-12-11 NOTE — Chronic Care Management (AMB) (Signed)
Chronic Care Management    Clinical Social Work Follow Up Note  12/11/2019 Name: Roy Hernandez MRN: [id number] DOB: 1928-03-08  Roy Hernandez is a 86 y.o. year old male who is a primary care patient of [name], MD. The CCM team was consulted for assistance with [employer] .   Review of patient status, including review of consultants reports, other relevant assessments, and collaboration with appropriate care team members and the patient's provider was performed as part of comprehensive patient evaluation and provision of chronic care management services.    SDOH (Social Determinants of Health) assessments performed: No    Outpatient Encounter Medications as of 12/11/2019  Medication Sig  . acetaminophen (TYLENOL) 500 MG tablet Take 500 mg by mouth at bedtime.  [name] alendronate (FOSAMAX) 70 MG tablet TAKE 1 TABLET EVERY MONDAY. TAKE WITH A FULL GLASS OF WATER ON AN EMPTY STOMACH.  [name] aspirin EC 81 MG tablet Take 81 mg by mouth at bedtime.  . Carboxymethylcellulose Sodium (LUBRICANT EYE DROPS PF OP) Place 1 drop into both eyes 3 (three) times daily as needed (for dry eyes.).   [name] cholecalciferol (VITAMIN D) 1000 UNITS tablet Take 1,000 Units by mouth daily.   . clotrimazole-betamethasone (LOTRISONE) cream   . donepezil (ARICEPT) 10 MG tablet TAKE 1 TABLET (10 MG TOTAL) BY MOUTH AT BEDTIME.  . finasteride (PROSCAR) 5 MG tablet Take 1 tablet (5 mg total) by mouth daily.  . fluticasone (FLONASE) 50 MCG/ACT nasal spray USE 2 SPRAYS INTO BOTH NOSTRILS DAILY.  [name] latanoprost (XALATAN) 0.005 % ophthalmic solution Place 1 drop into both eyes at bedtime.   [name] loratadine (CLARITIN) 10 MG tablet Take 1 tablet (10 mg total) by mouth daily.  . memantine (NAMENDA) 5 MG tablet Take 1 tablet (5 mg total) by mouth 2 (two) times daily.  . Menthol-Methyl Salicylate (MUSCLE RUB EX) Apply 1 application topically 4 (four) times daily as needed (for pain.).   [name] simvastatin (ZOCOR) 5 MG tablet TAKE 1 TABLET  AT BEDTIME  . tamsulosin (FLOMAX) 0.4 MG CAPS capsule Take 2 capsules (0.8 mg total) by mouth daily.  . timolol (TIMOPTIC) 0.5 % ophthalmic solution Place 1 drop into both eyes daily.   . vitamin B-12 (CYANOCOBALAMIN) 1000 MCG tablet Take 1,000 mcg by mouth daily.    No facility-administered encounter medications on file as of 12/11/2019.     Goals Addressed            This Visit's Progress   . "I need resources to help manage by father's condition" (pt-stated)       CARE PLAN ENTRY (see longitudinal plan of care for additional care plan information)  Current Barriers:  . Lacks knowledge of community resource: related to Dementia Support  Clinical Social Work Clinical Goal(s):  [name] Over the next 90 days, patient will work with SW to increase knowledge of community resources and support related to managing patient's Dementia  Interventions: . Confirmed with patient's daughter that  resource information for Dementia support was received . Confirmed plan that patient's daughter will make copies for family members . Per patient's daughter, patient's memory has improved with the medication change. He was also able to see family members over the weekend that he had not seen in years . Confirmed that [place] has started for patient-OT,PT and nursing-has been very helpful as well  . Confirmed that the family checks on patient daily (they have now developed a good routine) . Confirmed no additional community resource needs  at this time and agrees to call this Education officer, museum with any questions or concerns in the future.  Patient Self Care Activities:  . Attends all scheduled provider appointments . Knowledge deficit related to Dementia Support Resources  Please see past updates related to this goal by clicking on the "Past Updates" button in the selected goal          Follow Up Plan: Client's daughter  will call this social worker with any community resource questions or concerns in the  future   Ranya Fiddler, [place] Worker  [facility] Care Management [phone]

## 2019-12-13 DIAGNOSIS — E1140 Type 2 diabetes mellitus with diabetic neuropathy, unspecified: Secondary | ICD-10-CM | POA: Diagnosis not present

## 2019-12-13 DIAGNOSIS — E559 Vitamin D deficiency, unspecified: Secondary | ICD-10-CM | POA: Diagnosis not present

## 2019-12-13 DIAGNOSIS — M810 Age-related osteoporosis without current pathological fracture: Secondary | ICD-10-CM | POA: Diagnosis not present

## 2019-12-13 DIAGNOSIS — M47817 Spondylosis without myelopathy or radiculopathy, lumbosacral region: Secondary | ICD-10-CM | POA: Diagnosis not present

## 2019-12-13 DIAGNOSIS — D649 Anemia, unspecified: Secondary | ICD-10-CM | POA: Diagnosis not present

## 2019-12-13 DIAGNOSIS — J439 Emphysema, unspecified: Secondary | ICD-10-CM | POA: Diagnosis not present

## 2019-12-13 DIAGNOSIS — E1151 Type 2 diabetes mellitus with diabetic peripheral angiopathy without gangrene: Secondary | ICD-10-CM | POA: Diagnosis not present

## 2019-12-13 DIAGNOSIS — M1990 Unspecified osteoarthritis, unspecified site: Secondary | ICD-10-CM | POA: Diagnosis not present

## 2019-12-13 DIAGNOSIS — I10 Essential (primary) hypertension: Secondary | ICD-10-CM | POA: Diagnosis not present

## 2019-12-14 DIAGNOSIS — I10 Essential (primary) hypertension: Secondary | ICD-10-CM | POA: Diagnosis not present

## 2019-12-14 DIAGNOSIS — D649 Anemia, unspecified: Secondary | ICD-10-CM | POA: Diagnosis not present

## 2019-12-14 DIAGNOSIS — M1990 Unspecified osteoarthritis, unspecified site: Secondary | ICD-10-CM | POA: Diagnosis not present

## 2019-12-14 DIAGNOSIS — E1140 Type 2 diabetes mellitus with diabetic neuropathy, unspecified: Secondary | ICD-10-CM | POA: Diagnosis not present

## 2019-12-14 DIAGNOSIS — M810 Age-related osteoporosis without current pathological fracture: Secondary | ICD-10-CM | POA: Diagnosis not present

## 2019-12-14 DIAGNOSIS — M47817 Spondylosis without myelopathy or radiculopathy, lumbosacral region: Secondary | ICD-10-CM | POA: Diagnosis not present

## 2019-12-14 DIAGNOSIS — E1151 Type 2 diabetes mellitus with diabetic peripheral angiopathy without gangrene: Secondary | ICD-10-CM | POA: Diagnosis not present

## 2019-12-14 DIAGNOSIS — E559 Vitamin D deficiency, unspecified: Secondary | ICD-10-CM | POA: Diagnosis not present

## 2019-12-14 DIAGNOSIS — J439 Emphysema, unspecified: Secondary | ICD-10-CM | POA: Diagnosis not present

## 2019-12-17 ENCOUNTER — Ambulatory Visit: Payer: Self-pay | Admitting: *Deleted

## 2019-12-17 ENCOUNTER — Telehealth: Payer: Self-pay | Admitting: Family Medicine

## 2019-12-17 DIAGNOSIS — R4189 Other symptoms and signs involving cognitive functions and awareness: Secondary | ICD-10-CM

## 2019-12-17 DIAGNOSIS — I10 Essential (primary) hypertension: Secondary | ICD-10-CM

## 2019-12-17 NOTE — Chronic Care Management (AMB) (Signed)
Chronic Care Management    Clinical Social Work Follow Up Note  12/17/2019 Name: Roy Hernandez MRN: [id number] DOB: 1928/05/29  Roy Hernandez is a 86 y.o. year old male who is a primary care patient of [name], MD. The CCM team was consulted for assistance with [employer] .   Review of patient status, including review of consultants reports, other relevant assessments, and collaboration with appropriate care team members and the patient's provider was performed as part of comprehensive patient evaluation and provision of chronic care management services.    SDOH (Social Determinants of Health) assessments performed: No    Outpatient Encounter Medications as of 12/17/2019  Medication Sig  . acetaminophen (TYLENOL) 500 MG tablet Take 500 mg by mouth at bedtime.  [name] alendronate (FOSAMAX) 70 MG tablet TAKE 1 TABLET EVERY MONDAY. TAKE WITH A FULL GLASS OF WATER ON AN EMPTY STOMACH.  [name] aspirin EC 81 MG tablet Take 81 mg by mouth at bedtime.  . Carboxymethylcellulose Sodium (LUBRICANT EYE DROPS PF OP) Place 1 drop into both eyes 3 (three) times daily as needed (for dry eyes.).   [name] cholecalciferol (VITAMIN D) 1000 UNITS tablet Take 1,000 Units by mouth daily.   . clotrimazole-betamethasone (LOTRISONE) cream   . donepezil (ARICEPT) 10 MG tablet TAKE 1 TABLET (10 MG TOTAL) BY MOUTH AT BEDTIME.  . finasteride (PROSCAR) 5 MG tablet Take 1 tablet (5 mg total) by mouth daily.  . fluticasone (FLONASE) 50 MCG/ACT nasal spray USE 2 SPRAYS INTO BOTH NOSTRILS DAILY.  [name] latanoprost (XALATAN) 0.005 % ophthalmic solution Place 1 drop into both eyes at bedtime.   [name] loratadine (CLARITIN) 10 MG tablet Take 1 tablet (10 mg total) by mouth daily.  . memantine (NAMENDA) 5 MG tablet Take 1 tablet (5 mg total) by mouth 2 (two) times daily.  . Menthol-Methyl Salicylate (MUSCLE RUB EX) Apply 1 application topically 4 (four) times daily as needed (for pain.).   [name] simvastatin (ZOCOR) 5 MG tablet TAKE 1 TABLET  AT BEDTIME  . tamsulosin (FLOMAX) 0.4 MG CAPS capsule Take 2 capsules (0.8 mg total) by mouth daily.  . timolol (TIMOPTIC) 0.5 % ophthalmic solution Place 1 drop into both eyes daily.   . vitamin B-12 (CYANOCOBALAMIN) 1000 MCG tablet Take 1,000 mcg by mouth daily.    No facility-administered encounter medications on file as of 12/17/2019.     Goals Addressed            This Visit's Progress   . "I need resources to help manage by father's condition" (pt-stated)       CARE PLAN ENTRY (see longitudinal plan of care for additional care plan information)  Current Barriers:  . Lacks knowledge of community resource: related to Dementia Support  Clinical Social Work Clinical Goal(s):  [name] Over the next 90 days, patient will work with SW to increase knowledge of community resources and support related to managing patient's Dementia  Interventions:  . Return call to patient's daughter . Patient's daughter discussed having family conflicts related to caregiver responsibilities for patient . Discussed possibility of obtaining guardianship of patient . Patient's daughter currently is patient's caregiver and HCPOA . Per patient's daughter she plans to consult with their family attorney for legal advice . Emotional support provided . Confirmed no additional community resource needs at this time and agrees to call this Education officer, museum with any questions or concerns in the future.  Patient Self Care Activities:  . Attends all scheduled provider appointments . Knowledge  deficit related to Dementia Support Resources  Please see past updates related to this goal by clicking on the "Past Updates" button in the selected goal          Follow Up Plan: Client's daughter will contact this social worker with any additional community resource needs in the future   [place], [facility] [phone] \

## 2019-12-17 NOTE — Telephone Encounter (Incomplete)
{  [facility] AMB P[facility] TELEPHONE NOTE:[id number]}  

## 2019-12-17 NOTE — Patient Instructions (Signed)
Thank you allowing the Chronic Care Management Team to be a part of your care! It was a pleasure speaking with you today!  1. Please contact your family attorney to discuss guardianship options for patient  CCM (Chronic Care Management) Team   Neldon Labella  RN, BSN Nurse Care Coordinator  [phone]  [place], LCSW Clinical Social Worker [phone]  Goals Addressed            This Visit's Progress   . "I need resources to help manage by father's condition" (pt-stated)       CARE PLAN ENTRY (see longitudinal plan of care for additional care plan information)  Current Barriers:  . Lacks knowledge of community resource: related to Dementia Support  Clinical Social Work Clinical Goal(s):  [name] Over the next 90 days, patient will work with SW to increase knowledge of community resources and support related to managing patient's Dementia  Interventions:  . Return call to patient's daughter . Patient's daughter discussed having family conflicts related to caregiver responsibilities for patient . Discussed possibility of obtaining guardianship of patient . Patient's daughter currently is patient's caregiver and HCPOA . Per patient's daughter she plans to consult with their family attorney for legal advice . Emotional support provided . Confirmed no additional community resource needs at this time and agrees to call this Education officer, museum with any questions or concerns in the future.  Patient Self Care Activities:  . Attends all scheduled provider appointments . Knowledge deficit related to Dementia Support Resources  Please see past updates related to this goal by clicking on the "Past Updates" button in the selected goal          The patient verbalized understanding of instructions provided today and declined a print copy of patient instruction materials.   No further follow up required: patient's daughter will contact this Education officer, museum with any questions or  concerns regarding patient's community resource needs

## 2019-12-18 DIAGNOSIS — I10 Essential (primary) hypertension: Secondary | ICD-10-CM | POA: Diagnosis not present

## 2019-12-18 DIAGNOSIS — E559 Vitamin D deficiency, unspecified: Secondary | ICD-10-CM | POA: Diagnosis not present

## 2019-12-18 DIAGNOSIS — M47817 Spondylosis without myelopathy or radiculopathy, lumbosacral region: Secondary | ICD-10-CM | POA: Diagnosis not present

## 2019-12-18 DIAGNOSIS — J439 Emphysema, unspecified: Secondary | ICD-10-CM | POA: Diagnosis not present

## 2019-12-18 DIAGNOSIS — E1140 Type 2 diabetes mellitus with diabetic neuropathy, unspecified: Secondary | ICD-10-CM | POA: Diagnosis not present

## 2019-12-18 DIAGNOSIS — M1990 Unspecified osteoarthritis, unspecified site: Secondary | ICD-10-CM | POA: Diagnosis not present

## 2019-12-18 DIAGNOSIS — E1151 Type 2 diabetes mellitus with diabetic peripheral angiopathy without gangrene: Secondary | ICD-10-CM | POA: Diagnosis not present

## 2019-12-18 DIAGNOSIS — D649 Anemia, unspecified: Secondary | ICD-10-CM | POA: Diagnosis not present

## 2019-12-18 DIAGNOSIS — M810 Age-related osteoporosis without current pathological fracture: Secondary | ICD-10-CM | POA: Diagnosis not present

## 2019-12-18 NOTE — Telephone Encounter (Signed)
Please look below in Recent Patient Communication. Verbal Orders.

## 2019-12-19 DIAGNOSIS — M1990 Unspecified osteoarthritis, unspecified site: Secondary | ICD-10-CM | POA: Diagnosis not present

## 2019-12-19 DIAGNOSIS — J439 Emphysema, unspecified: Secondary | ICD-10-CM | POA: Diagnosis not present

## 2019-12-19 DIAGNOSIS — E559 Vitamin D deficiency, unspecified: Secondary | ICD-10-CM | POA: Diagnosis not present

## 2019-12-19 DIAGNOSIS — I10 Essential (primary) hypertension: Secondary | ICD-10-CM | POA: Diagnosis not present

## 2019-12-19 DIAGNOSIS — E1151 Type 2 diabetes mellitus with diabetic peripheral angiopathy without gangrene: Secondary | ICD-10-CM | POA: Diagnosis not present

## 2019-12-19 DIAGNOSIS — D649 Anemia, unspecified: Secondary | ICD-10-CM | POA: Diagnosis not present

## 2019-12-19 DIAGNOSIS — E1140 Type 2 diabetes mellitus with diabetic neuropathy, unspecified: Secondary | ICD-10-CM | POA: Diagnosis not present

## 2019-12-19 DIAGNOSIS — M810 Age-related osteoporosis without current pathological fracture: Secondary | ICD-10-CM | POA: Diagnosis not present

## 2019-12-19 DIAGNOSIS — M47817 Spondylosis without myelopathy or radiculopathy, lumbosacral region: Secondary | ICD-10-CM | POA: Diagnosis not present

## 2019-12-21 DIAGNOSIS — E559 Vitamin D deficiency, unspecified: Secondary | ICD-10-CM | POA: Diagnosis not present

## 2019-12-21 DIAGNOSIS — M1990 Unspecified osteoarthritis, unspecified site: Secondary | ICD-10-CM | POA: Diagnosis not present

## 2019-12-21 DIAGNOSIS — M810 Age-related osteoporosis without current pathological fracture: Secondary | ICD-10-CM | POA: Diagnosis not present

## 2019-12-21 DIAGNOSIS — I10 Essential (primary) hypertension: Secondary | ICD-10-CM | POA: Diagnosis not present

## 2019-12-21 DIAGNOSIS — D649 Anemia, unspecified: Secondary | ICD-10-CM | POA: Diagnosis not present

## 2019-12-21 DIAGNOSIS — E1140 Type 2 diabetes mellitus with diabetic neuropathy, unspecified: Secondary | ICD-10-CM | POA: Diagnosis not present

## 2019-12-21 DIAGNOSIS — J439 Emphysema, unspecified: Secondary | ICD-10-CM | POA: Diagnosis not present

## 2019-12-21 DIAGNOSIS — M47817 Spondylosis without myelopathy or radiculopathy, lumbosacral region: Secondary | ICD-10-CM | POA: Diagnosis not present

## 2019-12-21 DIAGNOSIS — E1151 Type 2 diabetes mellitus with diabetic peripheral angiopathy without gangrene: Secondary | ICD-10-CM | POA: Diagnosis not present

## 2019-12-27 ENCOUNTER — Telehealth: Payer: Self-pay | Admitting: Family Medicine

## 2019-12-27 NOTE — Telephone Encounter (Signed)
Copied from [place] [id number]. Topic: Quick Communication - Home Health Verbal Orders >> Dec 27, 2019 10:33 AM Jodie Echevaria wrote: Caller/Agency: [facility] Number: [phone] ok to LM Requesting OT/PT/Skilled Nursing/Social Work/Speech Therapy: OT  Frequency: 1 x 1

## 2019-12-28 DIAGNOSIS — J439 Emphysema, unspecified: Secondary | ICD-10-CM | POA: Diagnosis not present

## 2019-12-28 DIAGNOSIS — E1140 Type 2 diabetes mellitus with diabetic neuropathy, unspecified: Secondary | ICD-10-CM | POA: Diagnosis not present

## 2019-12-28 DIAGNOSIS — I10 Essential (primary) hypertension: Secondary | ICD-10-CM | POA: Diagnosis not present

## 2019-12-28 DIAGNOSIS — E1151 Type 2 diabetes mellitus with diabetic peripheral angiopathy without gangrene: Secondary | ICD-10-CM | POA: Diagnosis not present

## 2019-12-28 DIAGNOSIS — D649 Anemia, unspecified: Secondary | ICD-10-CM | POA: Diagnosis not present

## 2019-12-28 DIAGNOSIS — M47817 Spondylosis without myelopathy or radiculopathy, lumbosacral region: Secondary | ICD-10-CM | POA: Diagnosis not present

## 2019-12-28 DIAGNOSIS — M1990 Unspecified osteoarthritis, unspecified site: Secondary | ICD-10-CM | POA: Diagnosis not present

## 2019-12-28 DIAGNOSIS — E559 Vitamin D deficiency, unspecified: Secondary | ICD-10-CM | POA: Diagnosis not present

## 2019-12-28 DIAGNOSIS — M810 Age-related osteoporosis without current pathological fracture: Secondary | ICD-10-CM | POA: Diagnosis not present

## 2019-12-28 NOTE — Telephone Encounter (Signed)
Called Roy Hernandez to authorize verbal orders for OT for 1 x 1. No answer left vm.

## 2020-01-01 DIAGNOSIS — M810 Age-related osteoporosis without current pathological fracture: Secondary | ICD-10-CM | POA: Diagnosis not present

## 2020-01-01 DIAGNOSIS — M47817 Spondylosis without myelopathy or radiculopathy, lumbosacral region: Secondary | ICD-10-CM | POA: Diagnosis not present

## 2020-01-01 DIAGNOSIS — J439 Emphysema, unspecified: Secondary | ICD-10-CM | POA: Diagnosis not present

## 2020-01-01 DIAGNOSIS — E559 Vitamin D deficiency, unspecified: Secondary | ICD-10-CM | POA: Diagnosis not present

## 2020-01-01 DIAGNOSIS — I10 Essential (primary) hypertension: Secondary | ICD-10-CM | POA: Diagnosis not present

## 2020-01-01 DIAGNOSIS — E1151 Type 2 diabetes mellitus with diabetic peripheral angiopathy without gangrene: Secondary | ICD-10-CM | POA: Diagnosis not present

## 2020-01-01 DIAGNOSIS — E1140 Type 2 diabetes mellitus with diabetic neuropathy, unspecified: Secondary | ICD-10-CM | POA: Diagnosis not present

## 2020-01-01 DIAGNOSIS — M1990 Unspecified osteoarthritis, unspecified site: Secondary | ICD-10-CM | POA: Diagnosis not present

## 2020-01-01 DIAGNOSIS — D649 Anemia, unspecified: Secondary | ICD-10-CM | POA: Diagnosis not present

## 2020-01-09 DIAGNOSIS — E1151 Type 2 diabetes mellitus with diabetic peripheral angiopathy without gangrene: Secondary | ICD-10-CM | POA: Diagnosis not present

## 2020-01-09 DIAGNOSIS — M47817 Spondylosis without myelopathy or radiculopathy, lumbosacral region: Secondary | ICD-10-CM | POA: Diagnosis not present

## 2020-01-09 DIAGNOSIS — D649 Anemia, unspecified: Secondary | ICD-10-CM | POA: Diagnosis not present

## 2020-01-09 DIAGNOSIS — J439 Emphysema, unspecified: Secondary | ICD-10-CM | POA: Diagnosis not present

## 2020-01-09 DIAGNOSIS — M1990 Unspecified osteoarthritis, unspecified site: Secondary | ICD-10-CM | POA: Diagnosis not present

## 2020-01-09 DIAGNOSIS — M810 Age-related osteoporosis without current pathological fracture: Secondary | ICD-10-CM | POA: Diagnosis not present

## 2020-01-09 DIAGNOSIS — E1140 Type 2 diabetes mellitus with diabetic neuropathy, unspecified: Secondary | ICD-10-CM | POA: Diagnosis not present

## 2020-01-09 DIAGNOSIS — E559 Vitamin D deficiency, unspecified: Secondary | ICD-10-CM | POA: Diagnosis not present

## 2020-01-09 DIAGNOSIS — I10 Essential (primary) hypertension: Secondary | ICD-10-CM | POA: Diagnosis not present

## 2020-01-10 DIAGNOSIS — E559 Vitamin D deficiency, unspecified: Secondary | ICD-10-CM | POA: Diagnosis not present

## 2020-01-10 DIAGNOSIS — D649 Anemia, unspecified: Secondary | ICD-10-CM | POA: Diagnosis not present

## 2020-01-10 DIAGNOSIS — J439 Emphysema, unspecified: Secondary | ICD-10-CM | POA: Diagnosis not present

## 2020-01-10 DIAGNOSIS — I10 Essential (primary) hypertension: Secondary | ICD-10-CM | POA: Diagnosis not present

## 2020-01-10 DIAGNOSIS — E1140 Type 2 diabetes mellitus with diabetic neuropathy, unspecified: Secondary | ICD-10-CM | POA: Diagnosis not present

## 2020-01-10 DIAGNOSIS — M810 Age-related osteoporosis without current pathological fracture: Secondary | ICD-10-CM | POA: Diagnosis not present

## 2020-01-10 DIAGNOSIS — M47817 Spondylosis without myelopathy or radiculopathy, lumbosacral region: Secondary | ICD-10-CM | POA: Diagnosis not present

## 2020-01-10 DIAGNOSIS — M1990 Unspecified osteoarthritis, unspecified site: Secondary | ICD-10-CM | POA: Diagnosis not present

## 2020-01-10 DIAGNOSIS — E1151 Type 2 diabetes mellitus with diabetic peripheral angiopathy without gangrene: Secondary | ICD-10-CM | POA: Diagnosis not present

## 2020-01-15 DIAGNOSIS — E559 Vitamin D deficiency, unspecified: Secondary | ICD-10-CM | POA: Diagnosis not present

## 2020-01-15 DIAGNOSIS — E1140 Type 2 diabetes mellitus with diabetic neuropathy, unspecified: Secondary | ICD-10-CM | POA: Diagnosis not present

## 2020-01-15 DIAGNOSIS — M810 Age-related osteoporosis without current pathological fracture: Secondary | ICD-10-CM | POA: Diagnosis not present

## 2020-01-15 DIAGNOSIS — I10 Essential (primary) hypertension: Secondary | ICD-10-CM | POA: Diagnosis not present

## 2020-01-15 DIAGNOSIS — M47817 Spondylosis without myelopathy or radiculopathy, lumbosacral region: Secondary | ICD-10-CM | POA: Diagnosis not present

## 2020-01-15 DIAGNOSIS — M1990 Unspecified osteoarthritis, unspecified site: Secondary | ICD-10-CM | POA: Diagnosis not present

## 2020-01-15 DIAGNOSIS — E1151 Type 2 diabetes mellitus with diabetic peripheral angiopathy without gangrene: Secondary | ICD-10-CM | POA: Diagnosis not present

## 2020-01-15 DIAGNOSIS — J439 Emphysema, unspecified: Secondary | ICD-10-CM | POA: Diagnosis not present

## 2020-01-15 DIAGNOSIS — D649 Anemia, unspecified: Secondary | ICD-10-CM | POA: Diagnosis not present

## 2020-01-21 ENCOUNTER — Other Ambulatory Visit: Payer: Self-pay | Admitting: Family Medicine

## 2020-01-21 DIAGNOSIS — E785 Hyperlipidemia, unspecified: Secondary | ICD-10-CM

## 2020-01-28 ENCOUNTER — Encounter: Payer: Self-pay | Admitting: Family Medicine

## 2020-01-29 ENCOUNTER — Encounter: Payer: Self-pay | Admitting: Family Medicine

## 2020-01-30 ENCOUNTER — Other Ambulatory Visit: Payer: Self-pay

## 2020-01-30 ENCOUNTER — Ambulatory Visit (INDEPENDENT_AMBULATORY_CARE_PROVIDER_SITE_OTHER): Payer: Medicare HMO | Admitting: Family Medicine

## 2020-01-30 ENCOUNTER — Encounter: Payer: Self-pay | Admitting: Family Medicine

## 2020-01-30 VITALS — BP 96/78 | HR 69 | Temp 98.9°F | Resp 14 | Ht 67.0 in | Wt 164.4 lb

## 2020-01-30 DIAGNOSIS — Z8639 Personal history of other endocrine, nutritional and metabolic disease: Secondary | ICD-10-CM

## 2020-01-30 DIAGNOSIS — D472 Monoclonal gammopathy: Secondary | ICD-10-CM | POA: Diagnosis not present

## 2020-01-30 DIAGNOSIS — F0390 Unspecified dementia without behavioral disturbance: Secondary | ICD-10-CM | POA: Diagnosis not present

## 2020-01-30 DIAGNOSIS — E441 Mild protein-calorie malnutrition: Secondary | ICD-10-CM | POA: Diagnosis not present

## 2020-01-30 LAB — POCT GLYCOSYLATED HEMOGLOBIN (HGB A1C): Hemoglobin A1C: 5.3 % (ref 4.0–5.6)

## 2020-01-30 NOTE — Patient Instructions (Signed)

## 2020-01-30 NOTE — Progress Notes (Signed)
Name: Roy Hernandez   MRN: [id number]    DOB: 1927-08-03   Date:01/30/2020       Progress Note  Subjective  Chief Complaint  Chief Complaint  Patient presents with  . Follow-up    persistent cognitive impairment   . Diabetes    HPI  DMII: he has lost a lot of weight , A1C is normal, advised to not follow a low sugar diet at this time, needs to gain weight.   Malnutrition: daughter's have been rotation , they prepare his meals and either one of his daughters of his cousin that lives near by go to his house to watch him eating - he has lost 8 lbs, does not seem to be hungry and not craving sweets like he used to. Discussed high calorie diet  Dementia: daughters have noticed that he forgets their names, he was skipping meals - forgetting to eat, he was wondering outside his house at night , he also tried to leave his house from back door but seemed to have changed his mind when his walker got stuck ( daughter found walker by the back door). He recently called his daughter asking her to go to his house and when she arrived he had forgotten he had called her. Discussed nursing home placement, they will start the process and bring me the FL2 form to be filled out. They will try to hire a sitter in the mean time, they have been spending more time at his house over the past couple of weeks  MGUS: explained it may be the cause of his weight loss, they are not interested in further labs or tests at this time, comfort measures only   Patient Active Problem List   Diagnosis Date Noted  . Senile purpura ([place]) 09/01/2018  . Neuropathy 06/16/2018  . 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 Left bundle-branch block 02/13/2018  . History of right inguinal hernia repair 12/16/2017  . Epiretinal membrane (ERM) of right eye 10/11/2017  . DJD (degenerative joint disease) 10/04/2017  . Hyperglycemia 10/04/2017  . Advanced stage glaucoma 10/04/2017  . Hypertension, benign 07/06/2017  . PAD (peripheral artery  disease) ([place]) 10/19/2016  . Osteoporosis of femur without pathological fracture 11/25/2015  . AAA (abdominal aortic aneurysm) without rupture ([place]) 11/05/2014  . Abnormal ECG 11/05/2014  . At risk for falling 11/05/2014  . DD (diverticular disease) 11/05/2014  . Dyslipidemia 11/05/2014  . Failure of erection 11/05/2014  . Degenerative arthritis of lumbar spine 11/05/2014  . Compressed spine fracture ([place]) 11/05/2014  . Basal cell papilloma 11/05/2014  . Vitamin D deficiency 11/05/2014    Past Surgical History:  Procedure Laterality Date  . CATARACT EXTRACTION Bilateral   . HERNIA REPAIR  June 2019  . INGUINAL HERNIA REPAIR Right 01/27/2018   Medium Ultra Pro mesh.  Surgeon: Robert Bellow, MD;  Location: [facility] ORS;  Service: General;  Laterality: Right;  with mesh  . TOOTH EXTRACTION      Family History  Problem Relation Age of Onset  . Cancer Mother   . Hypertension Mother   . Diabetes Father   . Hyperlipidemia Father   . Diabetes Maternal Grandmother   . Diabetes Brother   . Kidney disease Brother        on dialysis  . Diabetes Sister   . Kidney disease Sister        dialysis  . Cancer Brother        esophageal  . Leukemia Sister  Social History   Tobacco Use  . Smoking status: Former Smoker    Packs/day: 1.00    Years: 34.00    Pack years: 34.00    Types: Cigarettes    Start date: 08/02/1945    Quit date: 05/26/1980    Years since quitting: 39.7  . Smokeless tobacco: Never Used  . Tobacco comment: smoking cessation materials not required  Substance Use Topics  . Alcohol use: No    Alcohol/week: 0.0 standard drinks     Current Outpatient Medications:  .  acetaminophen (TYLENOL) 500 MG tablet, Take 500 mg by mouth at bedtime., Disp: , Rfl:  .  alendronate (FOSAMAX) 70 MG tablet, TAKE 1 TABLET EVERY MONDAY. TAKE WITH A FULL GLASS OF WATER ON AN EMPTY STOMACH., Disp: 12 tablet, Rfl: 3 .  aspirin EC 81 MG tablet, Take 81 mg by mouth at bedtime., Disp:  , Rfl:  .  Carboxymethylcellulose Sodium (LUBRICANT EYE DROPS PF OP), Place 1 drop into both eyes 3 (three) times daily as needed (for dry eyes.). , Disp: , Rfl:  .  cholecalciferol (VITAMIN D) 1000 UNITS tablet, Take 1,000 Units by mouth daily. , Disp: , Rfl:  .  donepezil (ARICEPT) 10 MG tablet, TAKE 1 TABLET (10 MG TOTAL) BY MOUTH AT BEDTIME., Disp: 90 tablet, Rfl: 1 .  finasteride (PROSCAR) 5 MG tablet, Take 1 tablet (5 mg total) by mouth daily., Disp: 90 tablet, Rfl: 3 .  fluticasone (FLONASE) 50 MCG/ACT nasal spray, USE 2 SPRAYS INTO BOTH NOSTRILS DAILY., Disp: 48 g, Rfl: 1 .  latanoprost (XALATAN) 0.005 % ophthalmic solution, Place 1 drop into both eyes at bedtime. , Disp: , Rfl:  .  memantine (NAMENDA) 5 MG tablet, Take 1 tablet (5 mg total) by mouth 2 (two) times daily., Disp: 180 tablet, Rfl: 1 .  simvastatin (ZOCOR) 5 MG tablet, TAKE 1 TABLET AT BEDTIME, Disp: 90 tablet, Rfl: 1 .  tamsulosin (FLOMAX) 0.4 MG CAPS capsule, Take 2 capsules (0.8 mg total) by mouth daily., Disp: 90 capsule, Rfl: 3 .  timolol (TIMOPTIC) 0.5 % ophthalmic solution, Place 1 drop into both eyes daily. , Disp: , Rfl:  .  vitamin B-12 (CYANOCOBALAMIN) 1000 MCG tablet, Take 1,000 mcg by mouth daily. , Disp: , Rfl:  .  clotrimazole-betamethasone (LOTRISONE) cream, , Disp: , Rfl:  .  loratadine (CLARITIN) 10 MG tablet, Take 1 tablet (10 mg total) by mouth daily. (Patient not taking: Reported on 01/30/2020), Disp: 90 tablet, Rfl: 1 .  Menthol-Methyl Salicylate (MUSCLE RUB EX), Apply 1 application topically 4 (four) times daily as needed (for pain.).  (Patient not taking: Reported on 01/30/2020), Disp: , Rfl:   No Known Allergies  I personally reviewed active problem list, medication list, allergies, family history, social history, health maintenance with the patient/caregiver today.   ROS  Constitutional: Negative for fever, positive for  weight change.  Respiratory: Negative for cough and shortness of breath.    Cardiovascular: Negative for chest pain or palpitations.  Gastrointestinal: Negative for abdominal pain, no bowel changes.  Musculoskeletal: Positive  for gait problem but no  joint swelling.  Skin: Negative for rash.  Neurological: Negative for dizziness or headache.  No other specific complaints in a complete review of systems (except as listed in HPI above).  Objective  Vitals:   01/30/20 1115  BP: 96/78  Pulse: 69  Resp: 14  Temp: 98.9 F (37.2 C)  TempSrc: Temporal  SpO2: 95%  Weight: 164 lb 6.4 oz (74.6 kg)  Height: 5\' 7"  (1.702 m)    Body mass index is 25.75 kg/m.  Physical Exam  Constitutional: Patient appears frail, temporal waisting No distress.  HEENT: head atraumatic, normocephalic, pupils equal and reactive to light, neck supple Cardiovascular: Normal rate, regular rhythm and normal heart sounds.  No murmur heard. Trace  BLE ankle  edema. Pulmonary/Chest: Effort normal and breath sounds normal. No respiratory distress. Abdominal: Soft.  There is no tenderness. Psychiatric: cooperative, difficulty hearing, did not seem confused - daughters did most of the talking   Recent Results (from the past 2160 hour(s))  HM DIABETES EYE EXAM     Status: None   Collection Time: 11/22/19 12:00 AM  Result Value Ref Range   HM Diabetic Eye Exam No Retinopathy No Retinopathy  Bladder Scan (Post Void Residual) in office     Status: None   Collection Time: 11/29/19  9:10 AM  Result Value Ref Range   Scan Result 448mL   POCT HgB A1C     Status: Normal   Collection Time: 01/30/20 11:25 AM  Result Value Ref Range   Hemoglobin A1C 5.3 4.0 - 5.6 %   HbA1c POC (<> result, manual entry)     HbA1c, POC (prediabetic range)     HbA1c, POC (controlled diabetic range)       PHQ2/9: Depression screen [facility] 2/9 01/30/2020 11/15/2019 11/14/2019 11/13/2019 08/30/2019  Decreased Interest 0 0 0 0 0  Down, Depressed, Hopeless 0 0 0 0 0  PHQ - 2 Score 0 0 0 0 0  Altered sleeping 3 0 0 - 0   Tired, decreased energy 0 0 0 - 0  Change in appetite 0 0 0 - 0  Feeling bad or failure about yourself  0 0 0 - 0  Trouble concentrating 0 0 0 - 0  Moving slowly or fidgety/restless 0 0 0 - 0  Suicidal thoughts 0 0 0 - 0  PHQ-9 Score 3 0 0 - 0  Difficult doing work/chores Not difficult at all - - - Not difficult at all  Some recent data might be hidden    phq 9 is negative   Fall Risk: Fall Risk  01/30/2020 11/14/2019 11/13/2019 08/30/2019 05/30/2019  Falls in the past year? 1 0 1 1 1   Number falls in past yr: 1 0 1 0 1  Injury with Fall? 0 0 0 1 1  Risk Factor Category  - - - - -  Risk for fall due to : History of fall(s);Impaired balance/gait;Impaired mobility;Mental status change - Impaired balance/gait;Other (Comment) Impaired balance/gait -  Risk for fall due to: Comment - - cognitive impairment - -  Follow up - - Falls prevention discussed - -     Functional Status Survey: Is the patient deaf or have difficulty hearing?: Yes Does the patient have difficulty seeing, even when wearing glasses/contacts?: Yes Does the patient have difficulty concentrating, remembering, or making decisions?: Yes Does the patient have difficulty walking or climbing stairs?: Yes Does the patient have difficulty dressing or bathing?: Yes Does the patient have difficulty doing errands alone such as visiting a doctor's office or shopping?: Yes   Assessment & Plan  1. Dementia without behavioral disturbance, unspecified dementia type ([place])  Discussed placing, look for nursing home , no longer safe for him to stay at home alone. Needs 24 hour care until placed.  2. History of diabetes mellitus  - POCT HgB A1C  3. MGUS (monoclonal gammopathy of unknown significance)  Stopped going to  hematologist, family not interested in therapy   4. Mild protein-calorie malnutrition ([place])  Lost 8 lbs since April, seems to eat a good amount, but not snacking and is losing weight. Discussed high calorie diet

## 2020-02-06 ENCOUNTER — Ambulatory Visit: Payer: Medicare HMO | Admitting: Dermatology

## 2020-02-06 ENCOUNTER — Other Ambulatory Visit: Payer: Self-pay

## 2020-02-06 DIAGNOSIS — L570 Actinic keratosis: Secondary | ICD-10-CM | POA: Diagnosis not present

## 2020-02-06 DIAGNOSIS — L89322 Pressure ulcer of left buttock, stage 2: Secondary | ICD-10-CM

## 2020-02-06 DIAGNOSIS — L821 Other seborrheic keratosis: Secondary | ICD-10-CM | POA: Diagnosis not present

## 2020-02-06 DIAGNOSIS — L578 Other skin changes due to chronic exposure to nonionizing radiation: Secondary | ICD-10-CM

## 2020-02-06 DIAGNOSIS — L89329 Pressure ulcer of left buttock, unspecified stage: Secondary | ICD-10-CM

## 2020-02-06 MED ORDER — MUPIROCIN 2 % EX OINT: TOPICAL_OINTMENT | CUTANEOUS | 0 refills | Status: AC

## 2020-02-06 NOTE — Progress Notes (Signed)
   Follow-Up Visit   Subjective  Roy Hernandez is a 86 y.o. male who presents for the following: Lesion (on the scalp that has been there for a few months and is irregular ), lesion (on the left arm that is raised and red. Patient's daughter thinks it may have been there for a few months now), and lesion (on the buttocks crease that appears as an ulceration).  The following portions of the chart were reviewed this encounter and updated as appropriate:     Review of Systems:  No other skin or systemic complaints except as noted in HPI or Assessment and Plan.  Objective  Well appearing patient in no apparent distress; mood and affect are within normal limits.  A focused examination was performed including the L arm, hand, B/L leg and scalp. Relevant physical exam findings are noted in the Assessment and Plan.  Objective  L mid arm x 2, L hand dosrum x 1, mid crown scalp x 1, R frontal scalp x 1, R ear helix x 1, R mandible x 1 (7): Keratotic pink/brown papules  Objective  L medial gluteal cleft: 0.6 x 0.4 x 0.2cm (depth) ulceration, no evidence of infection   Assessment & Plan  AK (actinic keratosis) (7) L mid arm x 2, L hand dosrum x 1, mid crown scalp x 1, R frontal scalp x 1, R ear helix x 1, R mandible x 1  Hypertrophic Discussed Biopsy vrs cryotherapy, daughter prefers Cryo Recheck L arm and scalp on f/up Biopsy if not improved  Destruction of lesion - L mid arm x 2, L hand dosrum x 1, mid crown scalp x 1, R frontal scalp x 1, R ear helix x 1, R mandible x 1  Destruction method: cryotherapy   Destruction method comment:  Electrodessication Informed consent: discussed and consent obtained   Lesion destroyed using liquid nitrogen: Yes   Region frozen until ice ball extended beyond lesion: Yes   Outcome: patient tolerated procedure well with no complications   Post-procedure details: wound care instructions given   Additional details:  Prior to procedure, discussed risks of  blister formation, small wound, skin dyspigmentation, or rare scar following cryotherapy.  Pressure injury of skin of left buttock, unspecified injury stage L medial gluteal cleft  Decubitus Ulcer Stage 2.  Light debridement performed today with Puracyn spray and Duoderm applied to aa. Patient to change Q3D. During that time clean with Puracyn spray and apply a thin coat of Mupirocin 2% ointment to aa, then cover with Duoderm. Avoid sitting for long periods of time and increase walking. Recommend a donut shaped pillow when sitting.   mupirocin ointment (BACTROBAN) 2 % - L medial gluteal cleft  Seborrheic Keratoses - Stuck-on, waxy, tan-brown papules and plaques  - Discussed benign etiology and prognosis. - Observe - Call for any changes  Actinic Damage - diffuse scaly erythematous macules with underlying dyspigmentation - Recommend daily broad spectrum sunscreen SPF 30+ to sun-exposed areas, reapply every 2 hours as needed.  - Call for new or changing lesions.   Return in about 2 months (around 04/08/2020).  Luther Redo, CMA, am acting as scribe for Brendolyn Patty, MD .  Documentation: I have reviewed the above documentation for accuracy and completeness, and I agree with the above.  Brendolyn Patty MD

## 2020-02-06 NOTE — Patient Instructions (Signed)
Cryotherapy Aftercare  . Wash gently with soap and water everyday.   . Apply Vaseline and Band-Aid daily until healed.  

## 2020-02-07 DIAGNOSIS — L8990 Pressure ulcer of unspecified site, unspecified stage: Secondary | ICD-10-CM | POA: Diagnosis not present

## 2020-02-07 NOTE — Addendum Note (Signed)
Addended by: Brendolyn Patty on: 02/07/2020 01:10 PM   Modules accepted: Level of Service

## 2020-02-08 ENCOUNTER — Other Ambulatory Visit: Payer: Self-pay | Admitting: Family Medicine

## 2020-02-08 DIAGNOSIS — R4189 Other symptoms and signs involving cognitive functions and awareness: Secondary | ICD-10-CM

## 2020-02-10 NOTE — Progress Notes (Signed)
MRN : [id number]  Roy Hernandez is a 86 y.o. (10/02/1927) male who presents with chief complaint of No chief complaint on file. [name]  History of Present Illness:   The patient returns to the office for surveillance of a known abdominal aortic aneurysm. Patient denies abdominal pain or back pain, no other abdominal complaints. No changes suggesting embolic episodes.   There have been no interval changes in the patient's overall health care since his last visit.  Patient denies amaurosis fugax or TIA symptoms. There is no history of claudication or rest pain symptoms of the lower extremities. The patient denies angina or shortness of breath.  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    Past Medical History:  Diagnosis Date  . AAA (abdominal aortic aneurysm) ([place])   . Arthritis   . Basal cell carcinoma 06/18/2013   L spinal upper back  . Cataract   . Dementia ([place])   . Diabetes mellitus without complication (HCC)    diet controlled  . Glaucoma   . Hyperkalemia   . Hyperlipidemia   . Hypertension   . Inguinal hernia of right side without obstruction or gangrene   . Melanoma in situ ([place]) 07/05/2016   L post shoulder/excision  . Osteoporosis   . Peripheral vascular disease ([place])   . Vitamin D deficiency     Past Surgical History:  Procedure Laterality Date  . CATARACT EXTRACTION Bilateral   . HERNIA REPAIR  June 2019  . INGUINAL HERNIA REPAIR Right 01/27/2018   Medium Ultra Pro mesh.  Surgeon: Robert Bellow, MD;  Location: [facility] ORS;  Service: General;  Laterality: Right;  with mesh  . TOOTH EXTRACTION      Social History Social History   Tobacco Use  . Smoking status: Former Smoker    Packs/day: 1.00    Years: 34.00     Pack years: 34.00    Types: Cigarettes    Start date: 08/02/1945    Quit date: 05/26/1980    Years since quitting: 39.7  . Smokeless tobacco: Never Used  . Tobacco comment: smoking cessation materials not required  Vaping Use  . Vaping Use: Never used  Substance Use Topics  . Alcohol use: No    Alcohol/week: 0.0 standard drinks  . Drug use: No    Family History Family History  Problem Relation Age of Onset  . Cancer Mother   . Hypertension Mother   . Diabetes Father   . Hyperlipidemia Father   . Diabetes Maternal Grandmother   . Diabetes Brother   . Kidney disease Brother        on dialysis  . Diabetes Sister   . Kidney disease Sister        dialysis  . Cancer Brother        esophageal  . Leukemia Sister     No Known Allergies   REVIEW OF SYSTEMS (Negative unless checked)  Constitutional: [] Weight loss  [] Fever  [] Chills Cardiac: [] Chest pain   [] Chest pressure   [] Palpitations   [] Shortness of breath when laying flat   [] Shortness of breath with exertion. Vascular:  [] Pain in legs with walking   [] Pain in legs at rest  [] History of DVT   [] Phlebitis   []   Swelling in legs   [] Varicose veins   [] Non-healing ulcers Pulmonary:   [] Uses home oxygen   [] Productive cough   [] Hemoptysis   [] Wheeze  [] COPD   [] Asthma Neurologic:  [] Dizziness   [] Seizures   [] History of stroke   [] History of TIA  [] Aphasia   [] Vissual changes   [] Weakness or numbness in arm   [] Weakness or numbness in leg Musculoskeletal:   [] Joint swelling   [] Joint pain   [] Low back pain Hematologic:  [] Easy bruising  [] Easy bleeding   [] Hypercoagulable state   [] Anemic Gastrointestinal:  [] Diarrhea   [] Vomiting  [] Gastroesophageal reflux/heartburn   [] Difficulty swallowing. Genitourinary:  [] Chronic kidney disease   [] Difficult urination  [] Frequent urination   [] Blood in urine Skin:  [] Rashes   [] Ulcers  Psychological:  [] History of anxiety   []  History of major depression.  Physical Examination  There  were no vitals filed for this visit. There is no height or weight on file to calculate BMI. Gen: WD/WN, NAD Head: [place]/AT, No temporalis wasting.  Ear/Nose/Throat: Hearing grossly intact, nares w/o erythema or drainage Eyes: PER, EOMI, sclera nonicteric.  Neck: Supple, no large masses.   Pulmonary:  Good air movement, no audible wheezing bilaterally, no use of accessory muscles.  Cardiac: RRR, no JVD Vascular: Vessel Right Left  Radial Palpable Palpable  Gastrointestinal: Non-distended. No guarding/no peritoneal signs.  Musculoskeletal: M/S 5/5 throughout.  No deformity or atrophy.  Neurologic: CN 2-12 intact. Symmetrical.  Speech is fluent. Motor exam as listed above. Psychiatric: Judgment intact, Mood & affect appropriate for pt's clinical situation. Dermatologic: No rashes or ulcers noted.  No changes consistent with cellulitis.   CBC Lab Results  Component Value Date   WBC 6.5 08/13/2019   HGB 12.8 (L) 08/13/2019   HCT 38.1 (L) 08/13/2019   MCV 96.9 08/13/2019   PLT 155 08/13/2019    BMET    Component Value Date/Time   NA 139 08/13/2019 1254   NA 141 02/09/2018 0000   K 3.8 08/13/2019 1254   CL 104 08/13/2019 1254   CO2 26 08/13/2019 1254   GLUCOSE 135 (H) 08/13/2019 1254   BUN 21 08/13/2019 1254   BUN 19 02/09/2018 0000   CREATININE 0.94 08/13/2019 1254   CREATININE 0.99 09/01/2018 1451   CALCIUM 9.0 08/13/2019 1254   GFRNONAA >60 08/13/2019 1254   GFRNONAA 67 09/01/2018 1451   GFRAA >60 08/13/2019 1254   GFRAA 78 09/01/2018 1451   CrCl cannot be calculated (Patient's most recent lab result is older than the maximum 21 days allowed.).  COAG No results found for: INR, PROTIME  Radiology No results found.   Assessment/Plan 1. AAA (abdominal aortic aneurysm) without rupture (HCC) No surgery or intervention at this time. The patient has an asymptomatic abdominal aortic aneurysm that is less than 4 cm in maximal diameter. I have discussed the natural history  of abdominal aortic aneurysm and the small risk of rupture for aneurysm less than 5 cm in size. However, as these small aneurysms tend to enlarge over time, continued surveillance with ultrasound or CT scan is mandatory.  I have also discussed optimizing medical management with hypertension and lipid control and the importance of abstinence from tobacco. The patient is also encouraged to exercise a minimum of 30 minutes 4 times a week.  Should the patient develop new onset abdominal or back pain or signs of peripheral embolization they are instructed to seek medical attention immediately and to alert the physician providing care that they have  an aneurysm.  The patient voices their understanding. The patient will return in 12 months with an aortic duplex. - VAS US AORTA/IVC/ILIACS; Future  2. PAD (peripheral artery disease) (HCC) Recommend:  The patient has evidence of atherosclerosis of the lower extremities with claudication. The patient does not voice lifestyle limiting changes at this point in time.  Noninvasive studies do not suggest clinically significant change.  No invasive studies, angiography or surgery at this time The patient should continue walking and begin a more formal exercise program.  The patient should continue antiplatelet therapy and aggressive treatment of the lipid abnormalities  No changes in the patient's medications at this time  The patient should continue wearing graduated compression socks 10-15 mmHg strength to control the mild edema.  3. Hypertension, benign Continue antihypertensive medications as already ordered, these medications have been reviewed and there are no changes at this time.   4. Dyslipidemia Continue statin as ordered and reviewed, no changes at this time   Hortencia Pilar, MD  02/10/2020 3:41 PM

## 2020-02-11 ENCOUNTER — Other Ambulatory Visit: Payer: Self-pay

## 2020-02-11 ENCOUNTER — Ambulatory Visit (INDEPENDENT_AMBULATORY_CARE_PROVIDER_SITE_OTHER): Payer: Medicare HMO

## 2020-02-11 ENCOUNTER — Encounter (INDEPENDENT_AMBULATORY_CARE_PROVIDER_SITE_OTHER): Payer: Self-pay | Admitting: Vascular Surgery

## 2020-02-11 ENCOUNTER — Ambulatory Visit (INDEPENDENT_AMBULATORY_CARE_PROVIDER_SITE_OTHER): Payer: Medicare HMO | Admitting: Vascular Surgery

## 2020-02-11 VITALS — BP 102/59 | HR 77 | Resp 16 | Wt 163.0 lb

## 2020-02-11 DIAGNOSIS — E785 Hyperlipidemia, unspecified: Secondary | ICD-10-CM | POA: Diagnosis not present

## 2020-02-11 DIAGNOSIS — I714 Abdominal aortic aneurysm, without rupture, unspecified: Secondary | ICD-10-CM

## 2020-02-11 DIAGNOSIS — I739 Peripheral vascular disease, unspecified: Secondary | ICD-10-CM

## 2020-02-11 DIAGNOSIS — I10 Essential (primary) hypertension: Secondary | ICD-10-CM | POA: Diagnosis not present

## 2020-02-12 ENCOUNTER — Encounter: Payer: Self-pay | Admitting: Dermatology

## 2020-02-12 ENCOUNTER — Telehealth: Payer: Self-pay

## 2020-02-12 ENCOUNTER — Encounter: Payer: Self-pay | Admitting: Family Medicine

## 2020-02-12 NOTE — Telephone Encounter (Signed)
Patient's daughter sent a message and a photo via MyChart concerning ulcers being treated on patient's buttocks. I called and spoke with patient's daughter and they are applying a small amount of mupirocin followed by DuoDerm. She advises that he is using a donut, laying on his side but the DuoDerm continues to "roll" off. He is having some incontinence issues. I spoke with Dr. Nicole Kindred and she wants patient to try a piece of DuoDerm Ultra Thin to see if that may adhere better. Called patient's daughter and advised I will leave some up front for them to try and she can call if she would like [place] to send it in to [facility]. Also told her to make sure the areas being treated are dry before applying DuoDerm, JS

## 2020-02-13 ENCOUNTER — Ambulatory Visit (INDEPENDENT_AMBULATORY_CARE_PROVIDER_SITE_OTHER): Payer: Medicare HMO | Admitting: Physician Assistant

## 2020-02-13 ENCOUNTER — Encounter: Payer: Self-pay | Admitting: Dermatology

## 2020-02-13 ENCOUNTER — Other Ambulatory Visit: Payer: Self-pay

## 2020-02-13 ENCOUNTER — Encounter: Payer: Self-pay | Admitting: Physician Assistant

## 2020-02-13 VITALS — BP 122/70 | HR 70

## 2020-02-13 DIAGNOSIS — R3129 Other microscopic hematuria: Secondary | ICD-10-CM

## 2020-02-13 DIAGNOSIS — N401 Enlarged prostate with lower urinary tract symptoms: Secondary | ICD-10-CM

## 2020-02-13 DIAGNOSIS — R3914 Feeling of incomplete bladder emptying: Secondary | ICD-10-CM | POA: Diagnosis not present

## 2020-02-13 LAB — BLADDER SCAN AMB NON-IMAGING

## 2020-02-13 NOTE — Progress Notes (Signed)
02/13/2020 1:39 PM   [name] 1927/10/19 [id number]  CC: Chief Complaint  Patient presents with  . Benign Prostatic Hypertrophy    unable to urinate    HPI: Roy Hernandez is a 86 y.o. male with PMH dementia and BPH with incomplete emptying and residuals in the 250-400 range managed by Dr. Diamantina Providence who presents today as a same-day add-on for evaluation of possible urinary retention. He is accompanied today by his daughter, who contributes to HPI.  Today, patient reports the inability to urinate since approximately 0400 this morning. He reported abdominal pain associated with this.  Patient was ultimately able to urinate in clinic today with PVR  143mL. He reports resolution of abdominal pain.  In-office UA today positive for 1+ blood; urine microscopy with 3-10 RBCs/HPF.   PMH: Past Medical History:  Diagnosis Date  . AAA (abdominal aortic aneurysm) ([place])   . Arthritis   . Basal cell carcinoma 06/18/2013   L spinal upper back  . Cataract   . Dementia ([place])   . Diabetes mellitus without complication (HCC)    diet controlled  . Glaucoma   . Hyperkalemia   . Hyperlipidemia   . Hypertension   . Inguinal hernia of right side without obstruction or gangrene   . Melanoma in situ ([place]) 07/05/2016   L post shoulder/excision  . Osteoporosis   . Peripheral vascular disease ([place])   . Vitamin D deficiency     Surgical History: Past Surgical History:  Procedure Laterality Date  . CATARACT EXTRACTION Bilateral   . HERNIA REPAIR  June 2019  . INGUINAL HERNIA REPAIR Right 01/27/2018   Medium Ultra Pro mesh.  Surgeon: Robert Bellow, MD;  Location: [facility] ORS;  Service: General;  Laterality: Right;  with mesh  . TOOTH EXTRACTION      Home Medications:  Allergies as of 02/13/2020   No Known Allergies     Medication List       Accurate as of February 13, 2020  1:39 PM. If you have any questions, ask your nurse or doctor.        acetaminophen 500 MG tablet Commonly  known as: TYLENOL Take 500 mg by mouth at bedtime.   alendronate 70 MG tablet Commonly known as: FOSAMAX TAKE 1 TABLET EVERY MONDAY. TAKE WITH A FULL GLASS OF WATER ON AN EMPTY STOMACH. What changed: See the new instructions.   aspirin EC 81 MG tablet Take 81 mg by mouth at bedtime.   cholecalciferol 25 MCG (1000 UNIT) tablet Commonly known as: VITAMIN D Take 1,000 Units by mouth daily.   clotrimazole-betamethasone cream Commonly known as: LOTRISONE   donepezil 10 MG tablet Commonly known as: ARICEPT TAKE 1 TABLET AT BEDTIME   finasteride 5 MG tablet Commonly known as: PROSCAR Take 1 tablet (5 mg total) by mouth daily.   fluticasone 50 MCG/ACT nasal spray Commonly known as: FLONASE USE 2 SPRAYS INTO BOTH NOSTRILS DAILY.   latanoprost 0.005 % ophthalmic solution Commonly known as: XALATAN Place 1 drop into both eyes at bedtime.   loratadine 10 MG tablet Commonly known as: CLARITIN Take 1 tablet (10 mg total) by mouth daily.   LUBRICANT EYE DROPS PF OP Place 1 drop into both eyes 3 (three) times daily as needed (for dry eyes.).   memantine 5 MG tablet Commonly known as: Namenda Take 1 tablet (5 mg total) by mouth 2 (two) times daily.   mupirocin ointment 2 % Commonly known as: BACTROBAN Apply to ulceration  every 3-4 days when changing Duoderm   MUSCLE RUB EX Apply 1 application topically 4 (four) times daily as needed (for pain.).   simvastatin 5 MG tablet Commonly known as: ZOCOR TAKE 1 TABLET AT BEDTIME   tamsulosin 0.4 MG Caps capsule Commonly known as: FLOMAX Take 2 capsules (0.8 mg total) by mouth daily.   timolol 0.5 % ophthalmic solution Commonly known as: TIMOPTIC Place 1 drop into both eyes daily.   vitamin B-12 1000 MCG tablet Commonly known as: CYANOCOBALAMIN Take 1,000 mcg by mouth daily.       Allergies:  No Known Allergies  Family History: Family History  Problem Relation Age of Onset  . Cancer Mother   . Hypertension Mother     . Diabetes Father   . Hyperlipidemia Father   . Diabetes Maternal Grandmother   . Diabetes Brother   . Kidney disease Brother        on dialysis  . Diabetes Sister   . Kidney disease Sister        dialysis  . Cancer Brother        esophageal  . Leukemia Sister     Social History:   reports that he quit smoking about 39 years ago. His smoking use included cigarettes. He started smoking about 74 years ago. He has a 34.00 pack-year smoking history. He has never used smokeless tobacco. He reports that he does not drink alcohol and does not use drugs.  Physical Exam: BP 122/70   Pulse 70   Constitutional:  Alert, no acute distress, nontoxic appearing HEENT: [place], AT Cardiovascular: No clubbing, cyanosis, or edema Respiratory: Normal respiratory effort, no increased work of breathing Skin: No rashes, bruises or suspicious lesions Neurologic: Grossly intact, no focal deficits, moving all 4 extremities  Laboratory Data: Results for orders placed or performed in visit on 02/13/20  CULTURE, URINE COMPREHENSIVE   Specimen: Urine   UR  Result Value Ref Range   Urine Culture, Comprehensive Preliminary report    Organism ID, Bacteria Comment   Microscopic Examination   Urine  Result Value Ref Range   WBC, UA 0-5 0 - 5 /hpf   RBC 3-10 (A) 0 - 2 /hpf   Epithelial Cells (non renal) None seen 0 - 10 /hpf   Bacteria, UA None seen None seen/Few  Urinalysis, Complete  Result Value Ref Range   Specific Gravity, UA <1.005 (L) 1.005 - 1.030   pH, UA 5.5 5.0 - 7.5   Color, UA Yellow Yellow   Appearance Ur Clear Clear   Leukocytes,UA Negative Negative   Protein,UA Negative Negative/Trace   Glucose, UA Negative Negative   Ketones, UA Negative Negative   RBC, UA 1+ (A) Negative   Bilirubin, UA Negative Negative   Urobilinogen, Ur 0.2 0.2 - 1.0 mg/dL   Nitrite, UA Negative Negative   Microscopic Examination See below:   BLADDER SCAN AMB NON-IMAGING  Result Value Ref Range   Scan Result  141ml    Assessment & Plan:   1. Benign prostatic hyperplasia with incomplete bladder emptying Patient able to void in clinic today with appropriate PVR following. UA notable for microhematuria only, see below. No further intervention indicated. Pt to continue Flomax and finasteride. - Urinalysis, Complete - BLADDER SCAN AMB NON-IMAGING  2. Microscopic hematuria Mild on UA today. Unclear if related to distended urinary bladder vs other. Will send for culture for further evaluation: if positive, will treat as indicated and obtain repeat UA to prove resolution. If no  growth, recommend hematuria workup. - CULTURE, URINE COMPREHENSIVE   Return if symptoms worsen or fail to improve.  Debroah Loop, PA-C  [facility] [address] [place], [place] [address] [phone]

## 2020-02-14 LAB — URINALYSIS, COMPLETE
Bilirubin, UA: NEGATIVE
Glucose, UA: NEGATIVE
Ketones, UA: NEGATIVE
Leukocytes,UA: NEGATIVE
Nitrite, UA: NEGATIVE
Protein,UA: NEGATIVE
Specific Gravity, UA: <1.005 — ABNORMAL LOW (ref 1.005–1.030)
Urobilinogen, Ur: 0.2 mg/dL (ref 0.2–1.0)
pH, UA: 5.5 (ref 5.0–7.5)

## 2020-02-14 LAB — MICROSCOPIC EXAMINATION
Bacteria, UA: NONE SEEN
Epithelial Cells (non renal): NONE SEEN /hpf (ref 0–10)

## 2020-02-17 ENCOUNTER — Encounter (INDEPENDENT_AMBULATORY_CARE_PROVIDER_SITE_OTHER): Payer: Self-pay | Admitting: Vascular Surgery

## 2020-02-18 ENCOUNTER — Telehealth: Payer: Self-pay | Admitting: Physician Assistant

## 2020-02-18 LAB — CULTURE, URINE COMPREHENSIVE

## 2020-02-18 NOTE — Telephone Encounter (Signed)
Notified patients daughter as advised. Scheduled patient with Dr Diamantina Providence for cysto for 8/5.

## 2020-02-18 NOTE — Telephone Encounter (Signed)
Please contact the patient's daughter and inform her that her father's recent urine culture was negative; he does not have a UTI. I recommend a cystoscopy for further evaluation of the blood in his urine. Please schedule him with Dr. Diamantina Providence.

## 2020-02-22 ENCOUNTER — Other Ambulatory Visit: Payer: Self-pay | Admitting: Family Medicine

## 2020-02-22 DIAGNOSIS — J302 Other seasonal allergic rhinitis: Secondary | ICD-10-CM

## 2020-02-22 DIAGNOSIS — J3089 Other allergic rhinitis: Secondary | ICD-10-CM

## 2020-03-06 ENCOUNTER — Ambulatory Visit (INDEPENDENT_AMBULATORY_CARE_PROVIDER_SITE_OTHER): Payer: Medicare HMO | Admitting: Urology

## 2020-03-06 ENCOUNTER — Other Ambulatory Visit: Payer: Self-pay

## 2020-03-06 ENCOUNTER — Encounter: Payer: Self-pay | Admitting: Urology

## 2020-03-06 VITALS — BP 83/52 | HR 63 | Ht 67.0 in | Wt 162.0 lb

## 2020-03-06 DIAGNOSIS — R3121 Asymptomatic microscopic hematuria: Secondary | ICD-10-CM

## 2020-03-06 DIAGNOSIS — N401 Enlarged prostate with lower urinary tract symptoms: Secondary | ICD-10-CM

## 2020-03-06 DIAGNOSIS — N138 Other obstructive and reflux uropathy: Secondary | ICD-10-CM

## 2020-03-06 NOTE — Progress Notes (Signed)
   03/06/2020 10:56 AM   [name] Dec 02, 1927 [id number]  Reason for visit: Follow up microscopic hematuria, BPH  HPI: Mr. Seliga was added to my schedule today for a possible cystoscopy.  Briefly, he is a 86 year old male with severe dementia who I followed long-term for BPH and chronic incomplete bladder emptying with PVRs ranging from 200 to 400 mL previously.  He is on maximal medical therapy with 0.8 mg Flomax and finasteride, which has significantly improved his urinary symptoms.  He was recently seen as an add-on by our PA Debroah Loop on 7/14 with feeling of incomplete emptying, however he was able to void in clinic with a PVR of 160 mL which is very good for him.  His urinalysis at that time showed microscopic hematuria with 3-10 RBCs, but was otherwise benign.  She set him up for cystoscopy for me for microscopic hematuria work-up.  He had a renal ultrasound in April 2021 that showed no hydronephrosis or kidney or bladder lesions.  Had a long discussion with the patient and his daughter today about cystoscopy, and I think with his age and frailty, and the fact that he had low-grade microscopic hematuria while straining to urinate, it is very reasonable to defer cystoscopy at this time.  We discussed the low, but nonzero, risk of missing a clinically significant malignancy.  We also discussed that with his normal renal ultrasound this is reassuring.  They strongly would like to avoid cystoscopy which is very reasonable.  Keep scheduled follow-up in January, repeat UA at that time, consider cystoscopy if worsening microscopic hematuria or worsening symptoms  Billey Co, MD  [facility] [address], [place], [place] [address] [phone]

## 2020-03-22 ENCOUNTER — Inpatient Hospital Stay
Admission: EM | Admit: 2020-03-22 | Discharge: 2020-03-27 | DRG: 177 | Disposition: A | Discharge status: Skilled Nursing Facility | Payer: Medicare HMO | Attending: Internal Medicine | Admitting: Internal Medicine

## 2020-03-22 ENCOUNTER — Other Ambulatory Visit: Payer: Self-pay

## 2020-03-22 ENCOUNTER — Emergency Department: Payer: Medicare HMO

## 2020-03-22 DIAGNOSIS — E559 Vitamin D deficiency, unspecified: Secondary | ICD-10-CM | POA: Diagnosis present

## 2020-03-22 DIAGNOSIS — R0902 Hypoxemia: Secondary | ICD-10-CM | POA: Insufficient documentation

## 2020-03-22 DIAGNOSIS — M810 Age-related osteoporosis without current pathological fracture: Secondary | ICD-10-CM | POA: Diagnosis present

## 2020-03-22 DIAGNOSIS — I447 Left bundle-branch block, unspecified: Secondary | ICD-10-CM | POA: Diagnosis not present

## 2020-03-22 DIAGNOSIS — I714 Abdominal aortic aneurysm, without rupture: Secondary | ICD-10-CM | POA: Diagnosis not present

## 2020-03-22 DIAGNOSIS — Z7982 Long term (current) use of aspirin: Secondary | ICD-10-CM

## 2020-03-22 DIAGNOSIS — H409 Unspecified glaucoma: Secondary | ICD-10-CM | POA: Diagnosis not present

## 2020-03-22 DIAGNOSIS — R05 Cough: Secondary | ICD-10-CM | POA: Diagnosis not present

## 2020-03-22 DIAGNOSIS — E1151 Type 2 diabetes mellitus with diabetic peripheral angiopathy without gangrene: Secondary | ICD-10-CM | POA: Diagnosis present

## 2020-03-22 DIAGNOSIS — Z7983 Long term (current) use of bisphosphonates: Secondary | ICD-10-CM | POA: Diagnosis not present

## 2020-03-22 DIAGNOSIS — J9601 Acute respiratory failure with hypoxia: Secondary | ICD-10-CM | POA: Diagnosis not present

## 2020-03-22 DIAGNOSIS — E860 Dehydration: Secondary | ICD-10-CM | POA: Diagnosis not present

## 2020-03-22 DIAGNOSIS — I10 Essential (primary) hypertension: Secondary | ICD-10-CM | POA: Diagnosis present

## 2020-03-22 DIAGNOSIS — Z79899 Other long term (current) drug therapy: Secondary | ICD-10-CM | POA: Diagnosis not present

## 2020-03-22 DIAGNOSIS — J439 Emphysema, unspecified: Secondary | ICD-10-CM | POA: Diagnosis present

## 2020-03-22 DIAGNOSIS — F0390 Unspecified dementia without behavioral disturbance: Secondary | ICD-10-CM | POA: Diagnosis present

## 2020-03-22 DIAGNOSIS — I959 Hypotension, unspecified: Secondary | ICD-10-CM | POA: Diagnosis not present

## 2020-03-22 DIAGNOSIS — Z8249 Family history of ischemic heart disease and other diseases of the circulatory system: Secondary | ICD-10-CM

## 2020-03-22 DIAGNOSIS — Z806 Family history of leukemia: Secondary | ICD-10-CM

## 2020-03-22 DIAGNOSIS — U071 COVID-19: Secondary | ICD-10-CM | POA: Diagnosis not present

## 2020-03-22 DIAGNOSIS — E785 Hyperlipidemia, unspecified: Secondary | ICD-10-CM | POA: Diagnosis present

## 2020-03-22 DIAGNOSIS — I493 Ventricular premature depolarization: Secondary | ICD-10-CM | POA: Diagnosis present

## 2020-03-22 DIAGNOSIS — D696 Thrombocytopenia, unspecified: Secondary | ICD-10-CM | POA: Diagnosis present

## 2020-03-22 DIAGNOSIS — Z87891 Personal history of nicotine dependence: Secondary | ICD-10-CM

## 2020-03-22 DIAGNOSIS — H9190 Unspecified hearing loss, unspecified ear: Secondary | ICD-10-CM | POA: Diagnosis present

## 2020-03-22 DIAGNOSIS — M1990 Unspecified osteoarthritis, unspecified site: Secondary | ICD-10-CM | POA: Diagnosis present

## 2020-03-22 DIAGNOSIS — R0602 Shortness of breath: Secondary | ICD-10-CM | POA: Diagnosis not present

## 2020-03-22 DIAGNOSIS — J1282 Pneumonia due to coronavirus disease 2019: Secondary | ICD-10-CM | POA: Diagnosis present

## 2020-03-22 DIAGNOSIS — Z86006 Personal history of melanoma in-situ: Secondary | ICD-10-CM

## 2020-03-22 DIAGNOSIS — D6959 Other secondary thrombocytopenia: Secondary | ICD-10-CM | POA: Diagnosis not present

## 2020-03-22 DIAGNOSIS — I739 Peripheral vascular disease, unspecified: Secondary | ICD-10-CM | POA: Diagnosis not present

## 2020-03-22 DIAGNOSIS — Z833 Family history of diabetes mellitus: Secondary | ICD-10-CM

## 2020-03-22 DIAGNOSIS — Z83438 Family history of other disorder of lipoprotein metabolism and other lipidemia: Secondary | ICD-10-CM

## 2020-03-22 DIAGNOSIS — Z841 Family history of disorders of kidney and ureter: Secondary | ICD-10-CM

## 2020-03-22 DIAGNOSIS — Z85828 Personal history of other malignant neoplasm of skin: Secondary | ICD-10-CM

## 2020-03-22 LAB — COMPREHENSIVE METABOLIC PANEL
ALT: 10 U/L (ref 0–44)
AST: 15 U/L (ref 15–41)
Albumin: 3.5 g/dL (ref 3.5–5.0)
Alkaline Phosphatase: 78 U/L (ref 38–126)
Anion gap: 10 (ref 5–15)
BUN: 23 mg/dL (ref 8–23)
CO2: 27 mmol/L (ref 22–32)
Calcium: 8.8 mg/dL — ABNORMAL LOW (ref 8.9–10.3)
Chloride: 94 mmol/L — ABNORMAL LOW (ref 98–111)
Creatinine, Ser: 0.92 mg/dL (ref 0.61–1.24)
GFR calc Af Amer: >60 mL/min (ref >60)
GFR calc non Af Amer: >60 mL/min (ref >60)
Glucose, Bld: 139 mg/dL — ABNORMAL HIGH (ref 70–99)
Potassium: 3.7 mmol/L (ref 3.5–5.1)
Sodium: 131 mmol/L — ABNORMAL LOW (ref 135–145)
Total Bilirubin: 0.5 mg/dL (ref 0.3–1.2)
Total Protein: 6.5 g/dL (ref 6.5–8.1)

## 2020-03-22 LAB — CBC WITH DIFFERENTIAL/PLATELET
Abs Immature Granulocytes: 0.05 10*3/uL (ref 0.00–0.07)
Basophils Absolute: 0 10*3/uL (ref 0.0–0.1)
Basophils Relative: 0 %
Eosinophils Absolute: 0 10*3/uL (ref 0.0–0.5)
Eosinophils Relative: 0 %
HCT: 35.4 % — ABNORMAL LOW (ref 39.0–52.0)
Hemoglobin: 11.7 g/dL — ABNORMAL LOW (ref 13.0–17.0)
Immature Granulocytes: 1 %
Lymphocytes Relative: 8 %
Lymphs Abs: 0.6 10*3/uL — ABNORMAL LOW (ref 0.7–4.0)
MCH: 31.8 pg (ref 26.0–34.0)
MCHC: 33.1 g/dL (ref 30.0–36.0)
MCV: 96.2 fL (ref 80.0–100.0)
Monocytes Absolute: 0.7 10*3/uL (ref 0.1–1.0)
Monocytes Relative: 9 %
Neutro Abs: 6.5 10*3/uL (ref 1.7–7.7)
Neutrophils Relative %: 82 %
Platelets: 156 10*3/uL (ref 150–400)
RBC: 3.68 MIL/uL — ABNORMAL LOW (ref 4.22–5.81)
RDW: 12.8 % (ref 11.5–15.5)
WBC: 7.8 10*3/uL (ref 4.0–10.5)
nRBC: 0 % (ref 0.0–0.2)

## 2020-03-22 LAB — PROTIME-INR
INR: 1 (ref 0.8–1.2)
Prothrombin Time: 13.1 seconds (ref 11.4–15.2)

## 2020-03-22 LAB — TROPONIN I (HIGH SENSITIVITY)
Troponin I (High Sensitivity): 33 ng/L — ABNORMAL HIGH (ref <18)
Troponin I (High Sensitivity): 27 ng/L — ABNORMAL HIGH (ref <18)

## 2020-03-22 LAB — LACTIC ACID, PLASMA
Lactic Acid, Venous: 1.2 mmol/L (ref 0.5–1.9)
Lactic Acid, Venous: 1.5 mmol/L (ref 0.5–1.9)

## 2020-03-22 LAB — PROCALCITONIN: Procalcitonin: <0.1 ng/mL

## 2020-03-22 MED ORDER — LACTATED RINGERS IV BOLUS
1000.0000 mL | Freq: Once | INTRAVENOUS | Status: AC
  Administered 2020-03-22: 1000 mL via INTRAVENOUS

## 2020-03-22 NOTE — ED Notes (Signed)
Per daughter, pt tested positive for covid with home test, reports her other daughter has covid as well. Per daughter, pt has c/o nasal congestion, cough, and appeared "pale" to her. Pt was febrile at home and was given tylenol to treat. Pt denies complaints at this time, is alert oriented to self and situation only. Daughter states pt mentation is baseline.

## 2020-03-22 NOTE — ED Provider Notes (Signed)
[facility] Emergency Department Provider Note   ____________________________________________   First MD Initiated Contact with Patient 03/22/20 2036     (approximate)  I have reviewed the triage vital signs and the nursing notes.   HISTORY  Chief Complaint Cough    HPI Roy Hernandez is a 86 y.o. male with past medical history of hypertension, hyperlipidemia, AAA, peripheral arterial disease, and left bundle branch block who presents to the ED for cough.  Daughter states that patient has been dealing with runny nose and congestion earlier in the week and was recently exposed to his son-in-law, who tested positive for COVID-19.  He seemed to have an increasing productive cough over the past 24 hours and his daughter purchased an over-the-counter Covid test earlier today.  When it was positive, she decided to call EMS to bring him to the ED.  EMS noted patient to have O2 sats in the 80s on room air along with low blood pressure.  History is limited due to patient's baseline dementia.  When he is asked how he feels he states "like a 86 year old."  He currently denies any chest pain or difficulty breathing, daughter does state he had a temp of 100.5 at the time of EMS arrival.  He is fully vaccinated for COVID-19 as of March.        Past Medical History:  Diagnosis Date  . AAA (abdominal aortic aneurysm) ([place])   . Arthritis   . Basal cell carcinoma 06/18/2013   L spinal upper back  . Cataract   . Dementia ([place])   . Diabetes mellitus without complication (HCC)    diet controlled  . Glaucoma   . Hyperkalemia   . Hyperlipidemia   . Hypertension   . Inguinal hernia of right side without obstruction or gangrene   . Melanoma in situ ([place]) 07/05/2016   L post shoulder/excision  . Osteoporosis   . Peripheral vascular disease ([place])   . Vitamin D deficiency     Patient Active Problem List   Diagnosis Date Noted  . Senile purpura ([place]) 09/01/2018  .  Neuropathy 06/16/2018  . 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 Left bundle-branch block 02/13/2018  . History of right inguinal hernia repair 12/16/2017  . Epiretinal membrane (ERM) of right eye 10/11/2017  . DJD (degenerative joint disease) 10/04/2017  . Hyperglycemia 10/04/2017  . Advanced stage glaucoma 10/04/2017  . Hypertension, benign 07/06/2017  . PAD (peripheral artery disease) ([place]) 10/19/2016  . Osteoporosis of femur without pathological fracture 11/25/2015  . AAA (abdominal aortic aneurysm) without rupture ([place]) 11/05/2014  . Abnormal ECG 11/05/2014  . At risk for falling 11/05/2014  . DD (diverticular disease) 11/05/2014  . Dyslipidemia 11/05/2014  . Failure of erection 11/05/2014  . Degenerative arthritis of lumbar spine 11/05/2014  . Compressed spine fracture ([place]) 11/05/2014  . Basal cell papilloma 11/05/2014  . Vitamin D deficiency 11/05/2014    Past Surgical History:  Procedure Laterality Date  . CATARACT EXTRACTION Bilateral   . HERNIA REPAIR  June 2019  . INGUINAL HERNIA REPAIR Right 01/27/2018   Medium Ultra Pro mesh.  Surgeon: Robert Bellow, MD;  Location: [facility] ORS;  Service: General;  Laterality: Right;  with mesh  . TOOTH EXTRACTION      Prior to Admission medications   Medication Sig Start Date End Date Taking? Authorizing Provider  acetaminophen (TYLENOL) 500 MG tablet Take 500 mg by mouth at bedtime.    [provider]  alendronate (FOSAMAX) 70  MG tablet TAKE 1 TABLET EVERY MONDAY. TAKE WITH A FULL GLASS OF WATER ON AN EMPTY STOMACH. Patient taking differently: 140 mg daily.  06/29/19   [name], MD  aspirin EC 81 MG tablet Take 81 mg by mouth at bedtime.    [provider]  Carboxymethylcellulose Sodium (LUBRICANT EYE DROPS PF OP) Place 1 drop into both eyes 3 (three) times daily as needed (for dry eyes.).  11/11/17   [provider]  cholecalciferol (VITAMIN D) 1000 UNITS tablet Take 1,000 Units by mouth daily.   11/26/11   Roselee Nova, MD  clotrimazole-betamethasone Donalynn Furlong) cream  11/22/19   [provider]  donepezil (ARICEPT) 10 MG tablet TAKE 1 TABLET AT BEDTIME 02/08/20   [name], MD  finasteride (PROSCAR) 5 MG tablet Take 1 tablet (5 mg total) by mouth daily. 11/29/19   Billey Co, MD  fluticasone (FLONASE) 50 MCG/ACT nasal spray USE 2 SPRAYS INTO BOTH NOSTRILS DAILY. 08/15/19   Sowles, Drue Stager, MD  latanoprost (XALATAN) 0.005 % ophthalmic solution Place 1 drop into both eyes at bedtime.     [provider]  loratadine (CLARITIN) 10 MG tablet TAKE 1 TABLET EVERY DAY 02/22/20   [name], MD  memantine (NAMENDA) 5 MG tablet Take 1 tablet (5 mg total) by mouth 2 (two) times daily. 11/14/19   [name], MD  Menthol-Methyl Salicylate (MUSCLE RUB EX) Apply 1 application topically 4 (four) times daily as needed (for pain.).  08/11/16   [provider]  mupirocin ointment (BACTROBAN) 2 % Apply to ulceration every 3-4 days when changing Duoderm 02/06/20   Brendolyn Patty, MD  simvastatin (ZOCOR) 5 MG tablet TAKE 1 TABLET AT BEDTIME 01/21/20   [name], MD  tamsulosin (FLOMAX) 0.4 MG CAPS capsule Take 2 capsules (0.8 mg total) by mouth daily. 11/29/19   Billey Co, MD  timolol (TIMOPTIC) 0.5 % ophthalmic solution Place 1 drop into both eyes daily.  04/28/15   [provider]  vitamin B-12 (CYANOCOBALAMIN) 1000 MCG tablet Take 1,000 mcg by mouth daily.     [provider]    Allergies Patient has no known allergies.  Family History  Problem Relation Age of Onset  . Cancer Mother   . Hypertension Mother   . Diabetes Father   . Hyperlipidemia Father   . Diabetes Maternal Grandmother   . Diabetes Brother   . Kidney disease Brother        on dialysis  . Diabetes Sister   . Kidney disease Sister        dialysis  . Cancer Brother        esophageal  . Leukemia Sister     Social History Social History   Tobacco Use  .  Smoking status: Former Smoker    Packs/day: 1.00    Years: 34.00    Pack years: 34.00    Types: Cigarettes    Start date: 08/02/1945    Quit date: 05/26/1980    Years since quitting: 39.8  . Smokeless tobacco: Never Used  . Tobacco comment: smoking cessation materials not required  Vaping Use  . Vaping Use: Never used  Substance Use Topics  . Alcohol use: No    Alcohol/week: 0.0 standard drinks  . Drug use: No    Review of Systems  Constitutional: Positive for fever/chills Eyes: No visual changes. ENT: No sore throat.  Positive for congestion. Cardiovascular: Denies chest pain. Respiratory: Denies shortness of breath.  Positive for cough.  Gastrointestinal: No abdominal pain.  No nausea, no vomiting.  No diarrhea.  No constipation. Genitourinary: Negative for dysuria. Musculoskeletal: Negative for back pain. Skin: Negative for rash. Neurological: Negative for headaches, focal weakness or numbness.  ____________________________________________   PHYSICAL EXAM:  VITAL SIGNS: ED Triage Vitals  Enc Vitals Group     BP 03/22/20 2036 (!) 92/51     Pulse Rate 03/22/20 2036 (!) 102     Resp 03/22/20 2036 (!) 25     Temp 03/22/20 2036 99.3 F (37.4 C)     Temp src --      SpO2 03/22/20 2036 (!) 85 %     Weight 03/22/20 2037 162 lb 0.6 oz (73.5 kg)     Height 03/22/20 2037 5\' 7"  (1.702 m)     Head Circumference --      Peak Flow --      Pain Score 03/22/20 2037 0     Pain Loc --      Pain Edu? --      Excl. in [place]? --     Constitutional: Awake and alert. Eyes: Conjunctivae are normal. Head: Atraumatic. Nose: No congestion/rhinnorhea. Mouth/Throat: Mucous membranes are dry. Neck: Normal ROM Cardiovascular: Normal rate, regular rhythm. Grossly normal heart sounds. Respiratory: Mild tachypnea with normal respiratory effort.  No retractions. Lungs CTAB. Gastrointestinal: Soft and nontender. No distention. Genitourinary: deferred Musculoskeletal: No lower extremity  tenderness nor edema. Neurologic:  Normal speech and language. No gross focal neurologic deficits are appreciated. Skin:  Skin is warm, dry and intact. No rash noted. Psychiatric: Mood and affect are normal. Speech and behavior are normal.  ____________________________________________   LABS (all labs ordered are listed, but only abnormal results are displayed)  Labs Reviewed  COMPREHENSIVE METABOLIC PANEL - Abnormal; Notable for the following components:      Result Value   Sodium 131 (*)    Chloride 94 (*)    Glucose, Bld 139 (*)    Calcium 8.8 (*)    All other components within normal limits  CBC WITH DIFFERENTIAL/PLATELET - Abnormal; Notable for the following components:   RBC 3.68 (*)    Hemoglobin 11.7 (*)    HCT 35.4 (*)    Lymphs Abs 0.6 (*)    All other components within normal limits  TROPONIN I (HIGH SENSITIVITY) - Abnormal; Notable for the following components:   Troponin I (High Sensitivity) 33 (*)    All other components within normal limits  CULTURE, BLOOD (ROUTINE X 2)  CULTURE, BLOOD (ROUTINE X 2)  SARS CORONAVIRUS 2 BY RT PCR (HOSPITAL ORDER, [place] [facility])  LACTIC ACID, PLASMA  PROTIME-INR  PROCALCITONIN  LACTIC ACID, PLASMA  URINALYSIS, COMPLETE (UACMP) WITH MICROSCOPIC  TROPONIN I (HIGH SENSITIVITY)   ____________________________________________  EKG  ED ECG REPORT I, Blake Divine, the attending physician, personally viewed and interpreted this ECG.   Date: 03/22/2020  EKG Time: 20:40  Rate: 97  Rhythm: normal sinus rhythm, occasional PVC noted, unifocal  Axis: LAD  Intervals:left bundle branch block  ST&T Change: None  PROCEDURES  Procedure(s) performed (including Critical Care):  .Critical Care Performed by: Blake Divine, MD Authorized by: Blake Divine, MD   Critical care provider statement:    Critical care time (minutes):  45   Critical care time was exclusive of:  Separately billable procedures and  treating other patients and teaching time   Critical care was necessary to treat or prevent imminent or life-threatening deterioration of the following conditions:  Respiratory failure   Critical care was time spent personally by me on the following activities:  Discussions with consultants, evaluation of patient's response to treatment, examination of patient, ordering and performing treatments and interventions, ordering and review of laboratory studies, ordering and review of radiographic studies, pulse oximetry, re-evaluation of patient's condition, obtaining history from patient or surrogate and review of old charts   I assumed direction of critical care for this patient from another provider in my specialty: no       ____________________________________________   INITIAL IMPRESSION / ASSESSMENT AND PLAN / ED COURSE       86 year old male with past medical history of hypertension, hyperlipidemia, AAA, peripheral arterial disease, and left bundle branch block presents to the ED after testing positive for COVID-19 at home earlier today.  He was found to be hypoxic on room air by EMS, also with low blood pressure.  O2 sats have improved on 3 L nasal cannula and patient does not appear in any respiratory distress at this time.  BP is low, however he is awake and alert and mentating appropriately, does appear slightly dehydrated we will hydrate with IV fluids.  Lower suspicion for bacterial sepsis at this time given patient's overall well appearance, but we will screen blood cultures, lactate, and procalcitonin.  Chest x-ray appears consistent with COVID-19 pneumonia, confirmatory PCR testing is pending at this time.  Lab work is reassuring with mildly elevated troponin similar to previous.  Patient's BP seems to be improving following IV fluid bolus and I doubt bacterial sepsis at this time given no leukocytosis, lactic acidosis, and normal procalcitonin.  O2 sats remained stable on 2 L nasal  cannula and patient is not in any respiratory distress.  Case discussed with hospitalist for admission.      ____________________________________________   FINAL CLINICAL IMPRESSION(S) / ED DIAGNOSES  Final diagnoses:  Pneumonia due to COVID-19 virus     ED Discharge Orders    None       Note:  This document was prepared using Dragon voice recognition software and may include unintentional dictation errors.   Blake Divine, MD 03/22/20 2216

## 2020-03-22 NOTE — H&P (Signed)
[facility] History and Physical  CODA MATHEY MOQ:[id number] DOB: 06-21-1928 DOA: 03/22/2020  Referring physician: ED physician PCP: [name], MD  Specialists:   Chief Complaint: Shortness of breath, cough,  HPI: Roy Hernandez is a 86 y.o. male with PMH of dementia, AAA, dyslipidemia, compressed spinal fracture, vitamin D deficiency D, hypertension, hyperglycemia, glaucoma, presented with shortness of breath  When I saw patient, patient is slightly lethargic, arousable, answer questions and  follow commands, daughter  at the bedside.  Per patient's daughter, and chart review, patient was fully on vaccinated on march.  Recently he exposed to his son-in-law who tested positive for Covid, For the last 24 hours, patient has increasing productive cough, shortness of breath. over-the-counter COVID test positive.  Also found temperature 100.5.  EMS found oxygen saturation 80s on room air.  Also hypotensive.  Daughter reports patient did not drink as usual.  Patient reports no chills, chest pain, palpitation,  abdominal pain,  nausea, vomiting, diarrhea, dysuria, leg swelling.  No change of taste/smell.   In ED, patient was found to have temperature 99.3, respiratory rate 14, blood pressure 86/56-96/48, oxygen saturation 85% on room air.  96% on 2 L.  Labs shows sodium 131, glucose 139, otherwise unremarkable CMP high-sensitivity troponin 33, lactic acid 1.2, procalcitonin less than 0.17.8, hemoglobin 11.7, INR 1.0 X-ray:1. Patchy areas of interstitial and airspace opacity on a background of pulmonary emphysema, findings may reflect atypical infection in this patient with reported history of COVID-19. 2. Distension of bowel loops in the upper abdomen as before.,  In ED patient was given LR 1 L hospitalist was requested to admit the patient.    EKG: Independently reviewed.  Sinus rhythm with PVC, nonspecific IVCD    Where does patient live?   At home   SNF    Assistant living  facility   [facility] Can patient participate in ADLs?  Yes     Barely     None  Little     Some   Review of Systems:   General: no fevers, chills, no changes in body weight, has poor appetite, has fatigue HEENT: no blurry vision, hearing changes or sore throat Pulm: no dyspnea, coughing, wheezing CV: no chest pain, no palpitations Abd: no nausea, vomiting, abdominal pain, diarrhea, constipation GU: no dysuria, burning on urination, increased urinary frequency, hematuria  Ext: no leg edema Neuro: no unilateral weakness, numbness, or tingling, no vision change or hearing loss Skin: no rash MSK: No muscle spasm, no deformity, no limitation of range of movement in spin Heme: No easy bruising.  Travel history: No recent long distant travel.  Allergy: No Known Allergies  Past Medical History:  Diagnosis Date  . AAA (abdominal aortic aneurysm) ([place])   . Arthritis   . Basal cell carcinoma 06/18/2013   L spinal upper back  . Cataract   . Dementia ([place])   . Diabetes mellitus without complication (HCC)    diet controlled  . Glaucoma   . Hyperkalemia   . Hyperlipidemia   . Hypertension   . Inguinal hernia of right side without obstruction or gangrene   . Melanoma in situ ([place]) 07/05/2016   L post shoulder/excision  . Osteoporosis   . Peripheral vascular disease ([place])   . Vitamin D deficiency     Past Surgical History:  Procedure Laterality Date  . CATARACT EXTRACTION Bilateral   . HERNIA REPAIR  June 2019  . INGUINAL HERNIA REPAIR Right 01/27/2018   Medium Ultra Pro  mesh.  Surgeon: Robert Bellow, MD;  Location: [facility] ORS;  Service: General;  Laterality: Right;  with mesh  . TOOTH EXTRACTION      Social History:  reports that he quit smoking about 39 years ago. His smoking use included cigarettes. He started smoking about 74 years ago. He has a 34.00 pack-year smoking history. He has never used smokeless tobacco. He reports that he does not drink alcohol and does not  use drugs.  Family History:  Family History  Problem Relation Age of Onset  . Cancer Mother   . Hypertension Mother   . Diabetes Father   . Hyperlipidemia Father   . Diabetes Maternal Grandmother   . Diabetes Brother   . Kidney disease Brother        on dialysis  . Diabetes Sister   . Kidney disease Sister        dialysis  . Cancer Brother        esophageal  . Leukemia Sister      Prior to Admission medications   Medication Sig Start Date End Date Taking? Authorizing Provider  acetaminophen (TYLENOL) 500 MG tablet Take 500 mg by mouth at bedtime.    [provider]  alendronate (FOSAMAX) 70 MG tablet TAKE 1 TABLET EVERY MONDAY. TAKE WITH A FULL GLASS OF WATER ON AN EMPTY STOMACH. Patient taking differently: 140 mg daily.  06/29/19   [name], MD  aspirin EC 81 MG tablet Take 81 mg by mouth at bedtime.    [provider]  Carboxymethylcellulose Sodium (LUBRICANT EYE DROPS PF OP) Place 1 drop into both eyes 3 (three) times daily as needed (for dry eyes.).  11/11/17   [provider]  cholecalciferol (VITAMIN D) 1000 UNITS tablet Take 1,000 Units by mouth daily.  11/26/11   Roselee Nova, MD  clotrimazole-betamethasone Donalynn Furlong) cream  11/22/19   [provider]  donepezil (ARICEPT) 10 MG tablet TAKE 1 TABLET AT BEDTIME 02/08/20   [name], MD  finasteride (PROSCAR) 5 MG tablet Take 1 tablet (5 mg total) by mouth daily. 11/29/19   Billey Co, MD  fluticasone (FLONASE) 50 MCG/ACT nasal spray USE 2 SPRAYS INTO BOTH NOSTRILS DAILY. 08/15/19   Sowles, Drue Stager, MD  latanoprost (XALATAN) 0.005 % ophthalmic solution Place 1 drop into both eyes at bedtime.     [provider]  loratadine (CLARITIN) 10 MG tablet TAKE 1 TABLET EVERY DAY 02/22/20   [name], MD  memantine (NAMENDA) 5 MG tablet Take 1 tablet (5 mg total) by mouth 2 (two) times daily. 11/14/19   [name], MD  Menthol-Methyl Salicylate (MUSCLE RUB EX)  Apply 1 application topically 4 (four) times daily as needed (for pain.).  08/11/16   [provider]  mupirocin ointment (BACTROBAN) 2 % Apply to ulceration every 3-4 days when changing Duoderm 02/06/20   Brendolyn Patty, MD  simvastatin (ZOCOR) 5 MG tablet TAKE 1 TABLET AT BEDTIME 01/21/20   [name], MD  tamsulosin (FLOMAX) 0.4 MG CAPS capsule Take 2 capsules (0.8 mg total) by mouth daily. 11/29/19   Billey Co, MD  timolol (TIMOPTIC) 0.5 % ophthalmic solution Place 1 drop into both eyes daily.  04/28/15   [provider]  vitamin B-12 (CYANOCOBALAMIN) 1000 MCG tablet Take 1,000 mcg by mouth daily.     [provider]    Physical Exam: Vitals:   03/22/20 2037 03/22/20 2040 03/22/20 2100 03/22/20 2130  BP:  (!) 86/56 (!) 90/53 [name])  96/48  Pulse:  99 86 74  Resp:  20 18 14   Temp:      SpO2:  92% 95% 96%  Weight: 73.5 kg     Height: 5\' 7"  (1.702 m)      General: Not in acute distress HEENT:       Eyes: PERRL, EOMI, no scleral icterus.       ENT: No discharge from the ears and nose, no pharynx injection, no tonsillar enlargement.        Neck: No JVD, no bruit, no mass felt. Heme: No neck lymph node enlargement. Cardiac: S1/S2, RRR, No murmurs, No gallops or rubs. Pulm: Good air movement bilaterally. No rales, wheezing, rhonchi or rubs. Abd: Soft, nondistended, nontender, no rebound pain, no organomegaly, BS present. Ext: No pitting leg edema bilaterally. 2+DP/PT pulse bilaterally. Musculoskeletal: No joint deformities, No joint redness or warmth, no limitation of ROM in spin. Skin: No rashes.  Neuro: Demented, slightly lethargic, cranial nerves II-XII grossly intact, moves all extremities normally.    Labs on Admission:  Basic Metabolic Panel: Recent Labs  Lab 03/22/20 2040  NA 131*  K 3.7  CL 94*  CO2 27  GLUCOSE 139*  BUN 23  CREATININE 0.92  CALCIUM 8.8*   Liver Function Tests: Recent Labs  Lab 03/22/20 2040  AST 15  ALT 10   ALKPHOS 78  BILITOT 0.5  PROT 6.5  ALBUMIN 3.5   No results for input(s): LIPASE, AMYLASE in the last 168 hours. No results for input(s): AMMONIA in the last 168 hours. CBC: Recent Labs  Lab 03/22/20 2040  WBC 7.8  NEUTROABS 6.5  HGB 11.7*  HCT 35.4*  MCV 96.2  PLT 156   Cardiac Enzymes: No results for input(s): CKTOTAL, CKMB, CKMBINDEX, TROPONINI in the last 168 hours.  BNP (last 3 results) No results for input(s): BNP in the last 8760 hours.  ProBNP (last 3 results) No results for input(s): PROBNP in the last 8760 hours.  CBG: No results for input(s): GLUCAP in the last 168 hours.  Radiological Exams on Admission: DG Chest Portable 1 View  Result Date: 03/22/2020 CLINICAL DATA:  Shortness of breath, positive COVID test. History of abnormal aorta. EXAM: PORTABLE CHEST 1 VIEW COMPARISON:  August 12, 2018 FINDINGS: Trachea is midline. Lung volumes are low. Accounting for this cardiomediastinal contours are stable and accentuated by AP technique. Patchy areas of interstitial and airspace opacity bilaterally. No dense consolidation. Distension of bowel loops in the upper abdomen as before. No sign of pleural effusion. On limited assessment visualized skeletal structures without acute process. IMPRESSION: 1. Patchy areas of interstitial and airspace opacity on a background of pulmonary emphysema, findings may reflect atypical infection in this patient with reported history of COVID-19. 2. Distension of bowel loops in the upper abdomen as before. Electronically Signed   By: Zetta Bills M.D.   On: 03/22/2020 21:20    Assessment/Plan Principal Problem:   COVID-19 Active Problems:   Abnormal ECG   Hypoxia   Dehydration    #hypoxia, COVID-19:  Patient have mild cough, sob  COVID19 test positive, chest x-ray shows b/l lung infiltrate  Now requiring 2 L nasal cannula oxygen. Admit telemetry, RT manage oxygen, breath treatment Patient have significant comorbidities CAD,  hypertension, diabetes Check d-dimer, CPK, CRP, LDH, ferritin, troponin, for risks stratification Symptomatic and supportive management. We will start steroids and remdesivir Day team eval for baricitinib  #hypotensive, dehydration: No sign of sepsis.  Lactic acid, procalcitonin WNL Response to  IV fluids. On gentle IV fluids.  # PMH of dementia, AAA, dyslipidemia, compressed spinal fracture, vitamin D deficiency D, hypertension, hyperglycemia, glaucoma, presented with shortness of breath Established Active problems see above Continue home medications if appropriate follow-up with his PCP, specialist when necessary   DVT ppx:    SQ Lovenox  Code Status: Full code Family Communication:     Yes, patient's daughter      at bed side Disposition Plan: Admit to inpatient   Date of Service 03/22/2020    Lenna Sciara [facility] Pager [phone]  If 7PM-7AM, please contact night-coverage [email or web address] Password [facility] 03/22/2020, 10:18 PM

## 2020-03-22 NOTE — ED Triage Notes (Signed)
Patient coming [facility] from home for positive COVID test. Patient hypoxic and hypotensive for EMS (87% on RA and 17/79T systolic). Patient also febrile prior to EMS arrival (100.5). patient has hx of dementia. Patient lives home alone and daughter checks on him daily.

## 2020-03-23 DIAGNOSIS — J9601 Acute respiratory failure with hypoxia: Secondary | ICD-10-CM

## 2020-03-23 DIAGNOSIS — D696 Thrombocytopenia, unspecified: Secondary | ICD-10-CM

## 2020-03-23 DIAGNOSIS — J1282 Pneumonia due to coronavirus disease 2019: Secondary | ICD-10-CM

## 2020-03-23 DIAGNOSIS — I959 Hypotension, unspecified: Secondary | ICD-10-CM | POA: Insufficient documentation

## 2020-03-23 DIAGNOSIS — U071 COVID-19: Secondary | ICD-10-CM

## 2020-03-23 DIAGNOSIS — F0390 Unspecified dementia without behavioral disturbance: Secondary | ICD-10-CM | POA: Diagnosis present

## 2020-03-23 LAB — CBC WITH DIFFERENTIAL/PLATELET
Abs Immature Granulocytes: 0.12 10*3/uL — ABNORMAL HIGH (ref 0.00–0.07)
Basophils Absolute: 0 10*3/uL (ref 0.0–0.1)
Basophils Relative: 1 %
Eosinophils Absolute: 0 10*3/uL (ref 0.0–0.5)
Eosinophils Relative: 0 %
HCT: 34.2 % — ABNORMAL LOW (ref 39.0–52.0)
Hemoglobin: 11.5 g/dL — ABNORMAL LOW (ref 13.0–17.0)
Immature Granulocytes: 2 %
Lymphocytes Relative: 11 %
Lymphs Abs: 0.7 10*3/uL (ref 0.7–4.0)
MCH: 32.1 pg (ref 26.0–34.0)
MCHC: 33.6 g/dL (ref 30.0–36.0)
MCV: 95.5 fL (ref 80.0–100.0)
Monocytes Absolute: 0.3 10*3/uL (ref 0.1–1.0)
Monocytes Relative: 6 %
Neutro Abs: 4.7 10*3/uL (ref 1.7–7.7)
Neutrophils Relative %: 80 %
Platelets: 102 10*3/uL — ABNORMAL LOW (ref 150–400)
RBC: 3.58 MIL/uL — ABNORMAL LOW (ref 4.22–5.81)
RDW: 13.1 % (ref 11.5–15.5)
Smear Review: NORMAL
WBC: 5.9 10*3/uL (ref 4.0–10.5)
nRBC: 0 % (ref 0.0–0.2)

## 2020-03-23 LAB — URINALYSIS, COMPLETE (UACMP) WITH MICROSCOPIC
Bacteria, UA: NONE SEEN
Bilirubin Urine: NEGATIVE
Glucose, UA: NEGATIVE mg/dL
Ketones, ur: NEGATIVE mg/dL
Leukocytes,Ua: NEGATIVE
Nitrite: NEGATIVE
Protein, ur: NEGATIVE mg/dL
Specific Gravity, Urine: 1.016 (ref 1.005–1.030)
Squamous Epithelial / HPF: NONE SEEN (ref 0–5)
pH: 5 (ref 5.0–8.0)

## 2020-03-23 LAB — BASIC METABOLIC PANEL
Anion gap: 10 (ref 5–15)
BUN: 17 mg/dL (ref 8–23)
CO2: 26 mmol/L (ref 22–32)
Calcium: 8.7 mg/dL — ABNORMAL LOW (ref 8.9–10.3)
Chloride: 100 mmol/L (ref 98–111)
Creatinine, Ser: 0.88 mg/dL (ref 0.61–1.24)
GFR calc Af Amer: >60 mL/min (ref >60)
GFR calc non Af Amer: >60 mL/min (ref >60)
Glucose, Bld: 127 mg/dL — ABNORMAL HIGH (ref 70–99)
Potassium: 3.8 mmol/L (ref 3.5–5.1)
Sodium: 136 mmol/L (ref 135–145)

## 2020-03-23 LAB — FERRITIN: Ferritin: 80 ng/mL (ref 24–336)

## 2020-03-23 LAB — SARS CORONAVIRUS 2 BY RT PCR (HOSPITAL ORDER, PERFORMED IN ~~LOC~~ HOSPITAL LAB): SARS Coronavirus 2: POSITIVE — AB

## 2020-03-23 LAB — GLUCOSE, CAPILLARY
Glucose-Capillary: 110 mg/dL — ABNORMAL HIGH (ref 70–99)
Glucose-Capillary: 131 mg/dL — ABNORMAL HIGH (ref 70–99)
Glucose-Capillary: 99 mg/dL (ref 70–99)
Glucose-Capillary: 92 mg/dL (ref 70–99)

## 2020-03-23 LAB — PHOSPHORUS: Phosphorus: 2.9 mg/dL (ref 2.5–4.6)

## 2020-03-23 LAB — MAGNESIUM: Magnesium: 1.7 mg/dL (ref 1.7–2.4)

## 2020-03-23 LAB — C-REACTIVE PROTEIN: CRP: 2.9 mg/dL — ABNORMAL HIGH (ref <1.0)

## 2020-03-23 LAB — FIBRIN DERIVATIVES D-DIMER (ARMC ONLY): Fibrin derivatives D-dimer (ARMC): 743.93 ng/mL (FEU) — ABNORMAL HIGH (ref 0.00–499.00)

## 2020-03-23 MED ORDER — VITAMIN B-12 1000 MCG PO TABS
1000.0000 ug | ORAL_TABLET | Freq: Every day | ORAL | Status: DC
  Administered 2020-03-23 – 2020-03-27 (×5): 1000 ug via ORAL
  Filled 2020-03-23 (×5): qty 1

## 2020-03-23 MED ORDER — ACETAMINOPHEN 500 MG PO TABS
500.0000 mg | ORAL_TABLET | Freq: Every day | ORAL | Status: DC
  Administered 2020-03-23 – 2020-03-26 (×2): 500 mg via ORAL
  Filled 2020-03-23 (×3): qty 1

## 2020-03-23 MED ORDER — LATANOPROST 0.005 % OP SOLN
1.0000 [drp] | Freq: Every day | OPHTHALMIC | Status: DC
  Administered 2020-03-24 – 2020-03-26 (×4): 1 [drp] via OPHTHALMIC
  Filled 2020-03-23: qty 2.5

## 2020-03-23 MED ORDER — SODIUM CHLORIDE 0.9 % IV SOLN
100.0000 mg | Freq: Every day | INTRAVENOUS | Status: AC
  Administered 2020-03-24 – 2020-03-27 (×4): 100 mg via INTRAVENOUS
  Filled 2020-03-23 (×3): qty 20

## 2020-03-23 MED ORDER — MEMANTINE HCL 5 MG PO TABS
5.0000 mg | ORAL_TABLET | Freq: Two times a day (BID) | ORAL | Status: DC
  Administered 2020-03-23 – 2020-03-27 (×7): 5 mg via ORAL
  Filled 2020-03-23 (×8): qty 1

## 2020-03-23 MED ORDER — TAMSULOSIN HCL 0.4 MG PO CAPS
0.8000 mg | ORAL_CAPSULE | Freq: Every day | ORAL | Status: DC
  Administered 2020-03-23 – 2020-03-27 (×5): 0.8 mg via ORAL
  Filled 2020-03-23 (×5): qty 2

## 2020-03-23 MED ORDER — VITAMIN D3 25 MCG (1000 UNIT) PO TABS
1000.0000 [IU] | ORAL_TABLET | Freq: Every day | ORAL | Status: DC
  Administered 2020-03-23 – 2020-03-27 (×5): 1000 [IU] via ORAL
  Filled 2020-03-23 (×10): qty 1

## 2020-03-23 MED ORDER — ACETAMINOPHEN 325 MG PO TABS: 650.0000 mg | ORAL_TABLET | Freq: Four times a day (QID) | ORAL | Status: DC | PRN

## 2020-03-23 MED ORDER — POLYVINYL ALCOHOL 1.4 % OP SOLN
1.0000 [drp] | Freq: Three times a day (TID) | OPHTHALMIC | Status: DC | PRN
  Filled 2020-03-23: qty 15

## 2020-03-23 MED ORDER — ASPIRIN EC 81 MG PO TBEC
81.0000 mg | DELAYED_RELEASE_TABLET | Freq: Every day | ORAL | Status: DC
  Administered 2020-03-23 – 2020-03-26 (×2): 81 mg via ORAL
  Filled 2020-03-23 (×3): qty 1

## 2020-03-23 MED ORDER — LACTATED RINGERS IV SOLN: INTRAVENOUS | Status: DC

## 2020-03-23 MED ORDER — INSULIN ASPART 100 UNIT/ML ~~LOC~~ SOLN
0.0000 [IU] | Freq: Three times a day (TID) | SUBCUTANEOUS | Status: DC
  Administered 2020-03-23: 1 [IU] via SUBCUTANEOUS
  Administered 2020-03-25: 2 [IU] via SUBCUTANEOUS
  Administered 2020-03-25: 1 [IU] via SUBCUTANEOUS
  Administered 2020-03-25 – 2020-03-26 (×2): 2 [IU] via SUBCUTANEOUS
  Filled 2020-03-23 (×6): qty 1

## 2020-03-23 MED ORDER — SIMVASTATIN 5 MG PO TABS
5.0000 mg | ORAL_TABLET | Freq: Every day | ORAL | Status: DC
  Administered 2020-03-23 – 2020-03-26 (×2): 5 mg via ORAL
  Filled 2020-03-23 (×5): qty 1

## 2020-03-23 MED ORDER — ACETAMINOPHEN 650 MG RE SUPP: 650.0000 mg | Freq: Four times a day (QID) | RECTAL | Status: DC | PRN

## 2020-03-23 MED ORDER — SODIUM CHLORIDE 0.9% FLUSH
3.0000 mL | Freq: Two times a day (BID) | INTRAVENOUS | Status: DC
  Administered 2020-03-23 – 2020-03-27 (×9): 3 mL via INTRAVENOUS

## 2020-03-23 MED ORDER — DEXAMETHASONE SODIUM PHOSPHATE 10 MG/ML IJ SOLN
6.0000 mg | INTRAMUSCULAR | Status: DC
  Administered 2020-03-23 – 2020-03-24 (×2): 6 mg via INTRAVENOUS
  Filled 2020-03-23: qty 1

## 2020-03-23 MED ORDER — LORATADINE 10 MG PO TABS
10.0000 mg | ORAL_TABLET | Freq: Every day | ORAL | Status: DC
  Administered 2020-03-23 – 2020-03-27 (×5): 10 mg via ORAL
  Filled 2020-03-23 (×5): qty 1

## 2020-03-23 MED ORDER — GUAIFENESIN ER 600 MG PO TB12
600.0000 mg | ORAL_TABLET | Freq: Two times a day (BID) | ORAL | Status: DC
  Administered 2020-03-23 – 2020-03-27 (×7): 600 mg via ORAL
  Filled 2020-03-23 (×10): qty 1

## 2020-03-23 MED ORDER — FLUTICASONE PROPIONATE 50 MCG/ACT NA SUSP
2.0000 | Freq: Every day | NASAL | Status: DC
  Administered 2020-03-23 – 2020-03-27 (×5): 2 via NASAL
  Filled 2020-03-23: qty 16

## 2020-03-23 MED ORDER — FINASTERIDE 5 MG PO TABS
5.0000 mg | ORAL_TABLET | Freq: Every day | ORAL | Status: DC
  Administered 2020-03-23 – 2020-03-27 (×5): 5 mg via ORAL
  Filled 2020-03-23 (×5): qty 1

## 2020-03-23 MED ORDER — ENOXAPARIN SODIUM 40 MG/0.4ML ~~LOC~~ SOLN
40.0000 mg | SUBCUTANEOUS | Status: DC
  Administered 2020-03-23 – 2020-03-27 (×5): 40 mg via SUBCUTANEOUS
  Filled 2020-03-23 (×5): qty 0.4

## 2020-03-23 MED ORDER — TIMOLOL MALEATE 0.5 % OP SOLN
1.0000 [drp] | Freq: Every day | OPHTHALMIC | Status: DC
  Administered 2020-03-23 – 2020-03-27 (×5): 1 [drp] via OPHTHALMIC
  Filled 2020-03-23: qty 5

## 2020-03-23 MED ORDER — DONEPEZIL HCL 5 MG PO TABS
10.0000 mg | ORAL_TABLET | Freq: Every day | ORAL | Status: DC
  Administered 2020-03-23: 10 mg via ORAL
  Filled 2020-03-23 (×2): qty 2

## 2020-03-23 MED ORDER — SODIUM CHLORIDE 0.9 % IV SOLN
200.0000 mg | Freq: Once | INTRAVENOUS | Status: AC
  Administered 2020-03-23: 200 mg via INTRAVENOUS
  Filled 2020-03-23: qty 200

## 2020-03-23 NOTE — Assessment & Plan Note (Signed)
--  plan as above 

## 2020-03-23 NOTE — ED Notes (Signed)
IV's wrapped with coban. Pt repositioned. Room cleaned.

## 2020-03-23 NOTE — ED Notes (Signed)
Patient is sleeping  

## 2020-03-23 NOTE — ED Notes (Signed)
Pt awake at this time just laying in bed watching tv.

## 2020-03-23 NOTE — ED Notes (Signed)
Sitter at bedside. Pt denies any needs.

## 2020-03-23 NOTE — ED Notes (Signed)
This tech as Actuary at this time.

## 2020-03-23 NOTE — ED Notes (Signed)
Put patient back on 02

## 2020-03-23 NOTE — ED Notes (Signed)
Pt sleeping. 

## 2020-03-23 NOTE — ED Notes (Signed)
Pt became agitated when secretary walked in with pt dinner tray. Pt asked "where he lived" and then said "get that mess out of here or ill get up." I explained to pt that he was only bringing in his dinner tray but he still refused. Pt tray placed at nurses station.

## 2020-03-23 NOTE — Hospital Course (Addendum)
4yom PMH dementia, fully vaccinated, presented with shortness of breath.  Admitted for acute hypoxic respiratory failure secondary to Covid pneumonia, with associated hypotension.

## 2020-03-23 NOTE — ED Notes (Signed)
This tech just took over sitting with patient. Patient is resting and watching tv. No needs at this time.

## 2020-03-23 NOTE — ED Notes (Signed)
Patient is sleeping very restlessly. Wakes up every few minutes.

## 2020-03-23 NOTE — ED Notes (Signed)
Patient's daughter left to go home. Patient placed on bed alarm. Patient repositioned in bed. Fall matts placed on floor. Patient given call bell and instructed to call for assistance and not get up. Patient verbalized understanding. Patient resting comfortably.

## 2020-03-23 NOTE — ED Notes (Signed)
RN to bedside for rounding. Patient had removed oxygen via nasal cannula. Patient 100% on RA. RN will continue to monitor.

## 2020-03-23 NOTE — ED Notes (Signed)
Offered patient urinal. He declined.

## 2020-03-23 NOTE — ED Notes (Signed)
Pt with stool and urine in brief. Linens changed, Pt cleaned, new brief and gown applied.

## 2020-03-23 NOTE — ED Notes (Signed)
Pt is confused and needs frequent redirection. Sitter requested of charge.

## 2020-03-23 NOTE — ED Notes (Signed)
Patient will not keep 02 on. It was cleared with the RN that as long as his 02 stayed about 94% it was ok to be off.

## 2020-03-23 NOTE — ED Notes (Signed)
I moved patients O2 sensor to his right ear so that he might not try to take it off as much.

## 2020-03-23 NOTE — Assessment & Plan Note (Addendum)
--  appears stable; continue donepezil, memantine

## 2020-03-23 NOTE — ED Notes (Addendum)
Pt calm and laying in bed; no needs voiced at this time. This tech remains as Actuary.

## 2020-03-23 NOTE — Assessment & Plan Note (Addendum)
--  secondary to acute illness, resolving; no further evaluation

## 2020-03-23 NOTE — Progress Notes (Addendum)
PROGRESS NOTE  Roy Hernandez LTJ:[id number] DOB: 1927-10-13 DOA: 03/22/2020 PCP: [name], MD  Brief History   89yom PMH dementia, fully vaccinated, presented with shortness of breath.  Admitted for acute hypoxic respiratory failure secondary to Covid pneumonia, with associated hypotension.   A & P  Acute hypoxemic respiratory failure due to COVID-19 [facility]) --hypoxia resolved, appears stable --CXR Patchy areas of interstitial and airspace opacity on a  background of pulmonary emphysema --oxygen RA --inflammatory markers . Ferritin 80 > . CRP . Fibrin derivatives 743 >  --Tx . Remdesiver 8/21 >  . Steroids 8/21 >  . Actemra/baricitinim not indicated. Discussed w/ daughter who consents to use if needed  Pneumonia due to COVID-19 virus --plan as above  Dementia ([place]) --appears stable; continue donepezil, memantine  Thrombocytopenia ([place]) --etiology unclear; follow w/ CBC in AM  Disposition Plan:  Discussion: appears improved; will continue current Rx, may be able to go home next 48 hours if still better  Status is: Inpatient  Remains inpatient appropriate because:IV treatments appropriate due to intensity of illness or inability to take PO and Inpatient level of care appropriate due to severity of illness   Dispo: The patient is from: Home              Anticipated d/c is to: Home              Anticipated d/c date is: 2 days              Patient currently is not medically stable to d/c.  DVT prophylaxis: enoxaparin (LOVENOX) injection 40 mg Start: 03/23/20 1000 Code Status: Full Family Communication: updated daughter by telephone  Murray Hodgkins, MD  [facility] Direct contact: see [email or web address] (further directions at bottom of note if needed) 7PM-7AM contact night coverage as at bottom of note 03/23/2020, 5:17 PM  LOS: 1 day   Significant Hospital Events   .    Consults:  .    Procedures:  .   Significant Diagnostic Tests:  [name]    Micro Data:   .    Antimicrobials:  .   Interval History/Subjective  Feels ok, breathing ok No pain Tolerating diet  Objective   Vitals:  Vitals:   03/23/20 1454 03/23/20 1500  BP:    Pulse:    Resp: 18 19  Temp:    SpO2: 96%     Exam:  Constitutional:   . Appears calm and comfortable ENMT:  . grossly normal hearing  Respiratory:  . CTA bilaterally, no w/r/r.  . Respiratory effort mildly increased Cardiovascular:  . RRR, no m/r/g . No LE extremity edema   Abdomen:  . soft Psychiatric:  . Mental status o Mood, affect appropriate  I have personally reviewed the following:   Today's Data  . CBG stable . BMP, Mg, Phos stable . plts 102  Scheduled Meds: . acetaminophen  500 mg Oral QHS  . aspirin EC  81 mg Oral QHS  . cholecalciferol  1,000 Units Oral Daily  . dexamethasone (DECADRON) injection  6 mg Intravenous Q24H  . donepezil  10 mg Oral QHS  . enoxaparin (LOVENOX) injection  40 mg Subcutaneous Q24H  . finasteride  5 mg Oral Daily  . fluticasone  2 spray Each Nare Daily  . guaiFENesin  600 mg Oral BID  . insulin aspart  0-9 Units Subcutaneous TID WC  . latanoprost  1 drop Both Eyes QHS  . loratadine  10 mg Oral Daily  . memantine  5 mg Oral BID  . simvastatin  5 mg Oral QHS  . sodium chloride flush  3 mL Intravenous Q12H  . tamsulosin  0.8 mg Oral Daily  . timolol  1 drop Both Eyes Daily  . vitamin B-12  1,000 mcg Oral Daily   Continuous Infusions: . lactated ringers 50 mL/hr at 03/23/20 1140  . [START ON 03/24/2020] remdesivir 100 mg in NS 100 mL      Principal Problem:   Acute hypoxemic respiratory failure due to COVID-19 [facility]) Active Problems:   Pneumonia due to COVID-19 virus   Hypotension   Dementia (HCC)   Abnormal ECG   Advanced stage glaucoma   COVID-19   Hypoxia   Dehydration   Thrombocytopenia (HCC)   LOS: 1 day   How to contact the [facility] Attending or Consulting provider [place] or covering provider during after hours [place], for this  patient?  1. Check the care team in [facility] and look for a) attending/consulting [facility] provider listed and b) the [facility] team listed 2. Log into [email or web address] and use [place]'s universal password to access. If you do not have the password, please contact the hospital operator. 3. Locate the [facility] provider you are looking for under [facility] and page to a number that you can be directly reached. 4. If you still have difficulty reaching the provider, please page the [facility] (Director on Call) for the Hospitalists listed on amion for assistance.

## 2020-03-23 NOTE — ED Notes (Signed)
I am back to sit with patient. He is resting and watching TV. RN just changed patients brief.

## 2020-03-23 NOTE — ED Notes (Signed)
Called RN to clear a break  for me to eat. She said she would keep an eye on patient. I will check back in with RN when I am done with my break. Charge said there was no other techs available to relieve me.

## 2020-03-23 NOTE — ED Notes (Signed)
Patient is resting. Watching TV. Offered him something to drink and he declined.

## 2020-03-23 NOTE — ED Notes (Signed)
[name] daughter would like to be updated upon room assignment.

## 2020-03-23 NOTE — Progress Notes (Signed)
Remdesivir - Pharmacy Brief Note     A/P:  Remdesivir 200 mg IVPB once followed by 100 mg IVPB daily x 4 days.   Hart Robinsons, PharmD Clinical Pharmacist  03/23/2020 12:45 AM

## 2020-03-23 NOTE — Assessment & Plan Note (Addendum)
--  03/22/20 20:36 O2 Sat 85% --8/21 CXR Patchy areas of interstitial and airspace opacity on a  background of pulmonary emphysema --Appears stable. --oxygen: RA --inflammatory markers . Ferritin 80 > 162 > 159 . CRP 2.9 > 4.5 > 6.3 . Fibrin derivatives 743 > 495 > 393  --Tx . Remdesiver 8/22 > 8/26. Not a candidate for outpt infusion per Outpt Infusion team secondary to dementia, inability to sit still and reliably follow commands . Steroids 8/22 >  . baricitinim > not indicated currently based on clinical status. Inflammatory markers mixed. Will continue to monitor. Discussed w/ daughter who consented to use if needed

## 2020-03-23 NOTE — ED Notes (Signed)
Pt on edge of bed trying to get up, monitor leads pulled off. Brief changed, pt redirected back to bed.

## 2020-03-24 ENCOUNTER — Encounter: Payer: Self-pay | Admitting: Internal Medicine

## 2020-03-24 LAB — CBC WITH DIFFERENTIAL/PLATELET
Abs Immature Granulocytes: 0.01 10*3/uL (ref 0.00–0.07)
Basophils Absolute: 0 10*3/uL (ref 0.0–0.1)
Basophils Relative: 0 %
Eosinophils Absolute: 0 10*3/uL (ref 0.0–0.5)
Eosinophils Relative: 0 %
HCT: 38.1 % — ABNORMAL LOW (ref 39.0–52.0)
Hemoglobin: 12.8 g/dL — ABNORMAL LOW (ref 13.0–17.0)
Immature Granulocytes: 0 %
Lymphocytes Relative: 12 %
Lymphs Abs: 0.6 10*3/uL — ABNORMAL LOW (ref 0.7–4.0)
MCH: 31.7 pg (ref 26.0–34.0)
MCHC: 33.6 g/dL (ref 30.0–36.0)
MCV: 94.3 fL (ref 80.0–100.0)
Monocytes Absolute: 0.3 10*3/uL (ref 0.1–1.0)
Monocytes Relative: 6 %
Neutro Abs: 3.9 10*3/uL (ref 1.7–7.7)
Neutrophils Relative %: 82 %
Platelets: 114 10*3/uL — ABNORMAL LOW (ref 150–400)
RBC: 4.04 MIL/uL — ABNORMAL LOW (ref 4.22–5.81)
RDW: 12.6 % (ref 11.5–15.5)
WBC: 4.8 10*3/uL (ref 4.0–10.5)
nRBC: 0 % (ref 0.0–0.2)

## 2020-03-24 LAB — C-REACTIVE PROTEIN: CRP: 4.5 mg/dL — ABNORMAL HIGH (ref <1.0)

## 2020-03-24 LAB — GLUCOSE, CAPILLARY
Glucose-Capillary: 119 mg/dL — ABNORMAL HIGH (ref 70–99)
Glucose-Capillary: 120 mg/dL — ABNORMAL HIGH (ref 70–99)
Glucose-Capillary: 113 mg/dL — ABNORMAL HIGH (ref 70–99)
Glucose-Capillary: 129 mg/dL — ABNORMAL HIGH (ref 70–99)

## 2020-03-24 LAB — PHOSPHORUS: Phosphorus: 2.9 mg/dL (ref 2.5–4.6)

## 2020-03-24 LAB — FERRITIN: Ferritin: 162 ng/mL (ref 24–336)

## 2020-03-24 LAB — MAGNESIUM: Magnesium: 1.8 mg/dL (ref 1.7–2.4)

## 2020-03-24 LAB — FIBRIN DERIVATIVES D-DIMER (ARMC ONLY): Fibrin derivatives D-dimer (ARMC): 495.22 ng/mL (FEU) (ref 0.00–499.00)

## 2020-03-24 MED ORDER — ZINC SULFATE 220 (50 ZN) MG PO CAPS
220.0000 mg | ORAL_CAPSULE | Freq: Every day | ORAL | Status: DC
  Administered 2020-03-24 – 2020-03-27 (×4): 220 mg via ORAL
  Filled 2020-03-24 (×4): qty 1

## 2020-03-24 MED ORDER — METHYLPREDNISOLONE SODIUM SUCC 125 MG IJ SOLR
60.0000 mg | Freq: Two times a day (BID) | INTRAMUSCULAR | Status: DC
  Administered 2020-03-24 – 2020-03-27 (×6): 60 mg via INTRAVENOUS
  Filled 2020-03-24 (×6): qty 2

## 2020-03-24 MED ORDER — ASCORBIC ACID 500 MG PO TABS
500.0000 mg | ORAL_TABLET | Freq: Every day | ORAL | Status: DC
  Administered 2020-03-24 – 2020-03-27 (×4): 500 mg via ORAL
  Filled 2020-03-24 (×4): qty 1

## 2020-03-24 NOTE — ED Notes (Signed)
Dr. Damita Dunnings notified regarding patient having 3 loose stools since 11 pm.

## 2020-03-24 NOTE — ED Notes (Signed)
Pt awake in the bed at this time talking to this tech. Pt 100% on RA. Pt appears to be relaxed and comfortable.

## 2020-03-24 NOTE — ED Notes (Signed)
Patient voided in the urinal and had a loose bowel movement in his brief. I changed brief.

## 2020-03-24 NOTE — ED Notes (Signed)
Admit MD at bedside

## 2020-03-24 NOTE — ED Notes (Signed)
Pt given breakfast tray and sat up for pt comfort.

## 2020-03-24 NOTE — ED Notes (Signed)
Patient cleaned of loose bowel movement.

## 2020-03-24 NOTE — ED Notes (Signed)
Safety Sitter at bedside 

## 2020-03-24 NOTE — TOC Initial Note (Signed)
Transition of Care [facility]) - Initial/Assessment Note    Patient Details  Name: Roy Hernandez MRN: [id number] Date of Birth: May 11, 1928  Transition of Care [facility]) CM/SW Contact:    Shelbie Hutching, RN Phone Number: 03/24/2020, 3:01 PM  Clinical Narrative:                 Patient has a history of dementia and has been confused here in the hospital.  Patient admitted with [place].  RNCM reached out to patient's daughter, [name], and explained role.  [name] reports that patient lives alone but she and her sister and brother come to see the patient every day.  Plan is for patient to return home with home health services and she can get one of the family members to stay with him for a few nights after discharge.  Patient has had [facility] in the past and daughter would like to use them again.  Jana Half with [facility] accepted referral for PT and OT.  When patient is ready for discharge [name] will come and pick him up.  Patient walks with a walker at home.    Expected Discharge Plan: [place] Barriers to Discharge: Continued Medical Work up   Patient Goals and CMS Choice Patient states their goals for this hospitalization and ongoing recovery are:: to return home with home health services CMS Medicare.gov Compare Post Acute Care list provided to:: Patient Represenative (must comment) Choice offered to / list presented to : Adult Children Oceanographer (daughter))  Expected Discharge Plan and Services Expected Discharge Plan: [place]   Discharge Planning Services: CM Consult Post Acute Care Choice: [place] arrangements for the past 2 months: [place]: PT, OT [place] Agency: [facility] Date [facility] Agency Contacted: 03/24/20 Time [place]: [place] Representative spoke with at [place]: Jana Half  Prior Living Arrangements/Services Living arrangements for the past 2 months: [place] with:: Self Patient language and need for interpreter reviewed:: Yes Do you feel safe going back to the place where you live?: Yes      Need for Family Participation in Patient Care: Yes (Comment) (dementia, COVID) Care giver support system in place?: Yes (comment) Current home services: DME (walker and cane) Criminal Activity/Legal Involvement Pertinent to Current Situation/Hospitalization: No - Comment as needed  Activities of Daily Living Home Assistive Devices/Equipment: Walker (specify type) ADL Screening (condition at time of admission) Patient's cognitive ability adequate to safely complete daily activities?: No Is the patient deaf or have difficulty hearing?: Yes Does the patient have difficulty seeing, even when wearing glasses/contacts?: No Does the patient have difficulty concentrating, remembering, or making decisions?: Yes Patient able to express need for assistance with ADLs?: No Does the patient have difficulty dressing or bathing?: Yes Independently performs ADLs?: No Communication: Independent Dressing (OT): Needs assistance Is this a change from baseline?: Pre-admission baseline Grooming: Needs assistance Is this a change from baseline?: Pre-admission baseline Feeding: Needs assistance Is this a change from baseline?: Pre-admission baseline Bathing: Needs assistance Is this a change from baseline?: Pre-admission baseline Toileting: Needs assistance Is this a change from baseline?: Pre-admission baseline In/Out Bed: Needs assistance Is this a change from baseline?: Pre-admission baseline Walks in Home: Needs assistance Is this a change from baseline?: Pre-admission baseline Does the patient  have difficulty walking or climbing stairs?: No Weakness of Legs: None Weakness of Arms/Hands: None  Permission Sought/Granted Permission sought to share information with : Case Manager, Family Supports, Other (comment) Permission granted to share  information with : Yes, Verbal Permission Granted  Share Information with NAME: [name]  Permission granted to share info w AGENCY: [employer]  Permission granted to share info w Relationship: daughter     Emotional Assessment       Orientation: : Fluctuating Orientation (Suspected and/or reported Sundowners) Alcohol / Substance Use: Not Applicable Psych Involvement: No (comment)  Admission diagnosis:  Pneumonia due to COVID-19 virus [U07.1, J12.82] COVID-19 [U07.1] Patient Active Problem List   Diagnosis Date Noted  . Acute hypoxemic respiratory failure due to COVID-19 ([place]) 03/23/2020  . Pneumonia due to COVID-19 virus 03/23/2020  . Hypotension 03/23/2020  . Dementia ([place]) 03/23/2020  . Thrombocytopenia ([place]) 03/23/2020  . COVID-19 03/22/2020  . Hypoxia 03/22/2020  . Dehydration 03/22/2020  . Senile purpura ([place]) 09/01/2018  . Neuropathy 06/16/2018  . 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 Left bundle-branch block 02/13/2018  . History of right inguinal hernia repair 12/16/2017  . Epiretinal membrane (ERM) of right eye 10/11/2017  . DJD (degenerative joint disease) 10/04/2017  . Hyperglycemia 10/04/2017  . Advanced stage glaucoma 10/04/2017  . Hypertension, benign 07/06/2017  . PAD (peripheral artery disease) ([place]) 10/19/2016  . Osteoporosis of femur without pathological fracture 11/25/2015  . AAA (abdominal aortic aneurysm) without rupture ([place]) 11/05/2014  . Abnormal ECG 11/05/2014  . At risk for falling 11/05/2014  . DD (diverticular disease) 11/05/2014  . Dyslipidemia 11/05/2014  . Failure of erection 11/05/2014  . Degenerative arthritis of lumbar spine 11/05/2014  . Compressed spine fracture ([place]) 11/05/2014  . Basal cell papilloma 11/05/2014  . Vitamin D deficiency 11/05/2014   PCP:  [name], MD Pharmacy:   [facility], [place] [address] Phone: [phone] Fax:  [phone]  [id number] - [place], [place] - [address] [place] [address] Phone: [phone] Fax: [phone]     Social Determinants of Health (SDOH) Interventions    Readmission Risk Interventions No flowsheet data found.

## 2020-03-24 NOTE — Progress Notes (Signed)
Spoke with patient daughter on phone. Updated her on patient's status. She stated that he normally is gentle and not aggressive. She stated for [place] to talk to him slowly and it would keep him calm. He is currently resting in bed. Call bell in reach. Bed alarm in place.

## 2020-03-24 NOTE — ED Notes (Signed)
PT voided in urinal.

## 2020-03-24 NOTE — Progress Notes (Signed)
PT Cancellation Note  Patient Details Name: Roy Hernandez MRN: [id number] DOB: 12/10/1927  [name] is a 3yoM who comes to [facility] on [facility]/22 c SOB, admitted c COVID PNA. PMH: dementia.   Cancelled Treatment:     Pt in bed upon entry, disoriented, confused, agitated, very fidgety manually fixated on anything that becomes front and center. Pt hallucinating at times when bed position is changed, also trying to catch authors attention through mirror at one point, not realizing this is a mirror as well as the same person currently in room. Pt trying to rip out draw sheet, and then later don a blanket as pants. Pt difficult to redirect. Pt does not respond to any questioning in a meaningful way, nor does he respond to simple mobility commands when cued. Pt is not able to participate in PT due to baseline dementia and cognitive deficits.   2:26 PM, 03/24/20 Etta Grandchild, PT, DPT Physical Therapist - [facility]  [phone] ([place])   [place] C 03/24/2020, 2:21 PM

## 2020-03-24 NOTE — ED Notes (Signed)
Report given to Emily RN

## 2020-03-24 NOTE — Progress Notes (Addendum)
PROGRESS NOTE  Roy Hernandez:[id number] DOB: 27-Jul-1928 DOA: 03/22/2020 PCP: [name], MD  Brief History   56yom PMH dementia, fully vaccinated, presented with shortness of breath.  Admitted for acute hypoxic respiratory failure secondary to Covid pneumonia, with associated hypotension.   A & P  Acute hypoxemic respiratory failure due to COVID-19 [facility]) --03/22/20 20:36 O2 Sat 85% --appears better today --8/21 CXR Patchy areas of interstitial and airspace opacity on a  background of pulmonary emphysema --oxygen RA --inflammatory markers . Ferritin 80 > 162 . CRP 2.9 > 4.5  . Fibrin derivatives 743 > 495  --Tx . Remdesiver 8/21 >  . Steroids 8/21 >  . Actemra/baricitinim not indicated. Discussed w/ daughter who consented to use if needed  Pneumonia due to COVID-19 virus --plan as above  Dementia ([place]) --appears stable; continue donepezil, memantine  Thrombocytopenia ([place]) --secondary to acute illness, stable, no further evaluation  Disposition Plan:  Discussion: appears to be improving; continue current Rx, follow lab work; if better 8/24 > home  Status is: Inpatient  Remains inpatient appropriate because:IV treatments appropriate due to intensity of illness or inability to take PO and Inpatient level of care appropriate due to severity of illness   Dispo: The patient is from: Home              Anticipated d/c is to: Home              Anticipated d/c date is: 1 day              Patient currently is not medically stable to d/c.  DVT prophylaxis: enoxaparin (LOVENOX) injection 40 mg Start: 03/23/20 1000 Code Status: Full Family Communication: updated daughter by telephone 8/23, she can care for at home and will be ready to take him home tomorrow if better  Murray Hodgkins, MD  [facility] Direct contact: see [email or web address] (further directions at bottom of note if needed) 7PM-7AM contact night coverage as at bottom of note 03/24/2020, 3:29 PM  LOS: 2 days    Significant Hospital Events   .    Consults:  .    Procedures:  .   Significant Diagnostic Tests:  [name]    Micro Data:  .    Antimicrobials:  .   Interval History/Subjective  Confused overnight but no other issues noted No complaints noted  Objective   Vitals:  Vitals:   03/24/20 0932 03/24/20 1124  BP: 133/87 (!) 103/49  Pulse: (!) 107 77  Resp: 19 17  Temp:  (!) 97.5 F (36.4 C)  SpO2: 100% 94%    Exam:  Constitutional:   . Appears calm and comfortable ENMT:  . grossly normal hearing  Respiratory:  . CTA bilaterally, no w/r/r.  . Respiratory effort normal. Cardiovascular:  . RRR, no m/r/g . No LE extremity edema   Psychiatric:  . Mental status o Mood, affect appropriate  I have personally reviewed the following:   Today's Data  . CBG stable . BMP, Mg, Phos stable . plts 102 > 114  Scheduled Meds: . acetaminophen  500 mg Oral QHS  . vitamin C  500 mg Oral Daily  . aspirin EC  81 mg Oral QHS  . cholecalciferol  1,000 Units Oral Daily  . donepezil  10 mg Oral QHS  . enoxaparin (LOVENOX) injection  40 mg Subcutaneous Q24H  . finasteride  5 mg Oral Daily  . fluticasone  2 spray Each Nare Daily  . guaiFENesin  600 mg Oral BID  .  insulin aspart  0-9 Units Subcutaneous TID WC  . latanoprost  1 drop Both Eyes QHS  . loratadine  10 mg Oral Daily  . memantine  5 mg Oral BID  . methylPREDNISolone (SOLU-MEDROL) injection  60 mg Intravenous Q12H  . simvastatin  5 mg Oral QHS  . sodium chloride flush  3 mL Intravenous Q12H  . tamsulosin  0.8 mg Oral Daily  . timolol  1 drop Both Eyes Daily  . vitamin B-12  1,000 mcg Oral Daily  . zinc sulfate  220 mg Oral Daily   Continuous Infusions: . lactated ringers 50 mL/hr at 03/24/20 0745  . remdesivir 100 mg in NS 100 mL Stopped (03/24/20 1228)    Principal Problem:   Acute hypoxemic respiratory failure due to COVID-19 [facility]) Active Problems:   Pneumonia due to COVID-19 virus   Hypotension    Dementia (HCC)   Abnormal ECG   Advanced stage glaucoma   COVID-19   Hypoxia   Dehydration   Thrombocytopenia (HCC)   LOS: 2 days   How to contact the [facility] Attending or Consulting provider [place] or covering provider during after hours [place], for this patient?  1. Check the care team in [facility] and look for a) attending/consulting TRH provider listed and b) the [facility] team listed 2. Log into [email or web address] and use [place]'s universal password to access. If you do not have the password, please contact the hospital operator. 3. Locate the [facility] provider you are looking for under [facility] and page to a number that you can be directly reached. 4. If you still have difficulty reaching the provider, please page the [facility] (Director on Call) for the Hospitalists listed on amion for assistance.

## 2020-03-25 ENCOUNTER — Encounter: Payer: Self-pay | Admitting: Family Medicine

## 2020-03-25 LAB — CBC WITH DIFFERENTIAL/PLATELET
Abs Immature Granulocytes: 0.04 10*3/uL (ref 0.00–0.07)
Basophils Absolute: 0 10*3/uL (ref 0.0–0.1)
Basophils Relative: 0 %
Eosinophils Absolute: 0 10*3/uL (ref 0.0–0.5)
Eosinophils Relative: 0 %
HCT: 37.5 % — ABNORMAL LOW (ref 39.0–52.0)
Hemoglobin: 12.6 g/dL — ABNORMAL LOW (ref 13.0–17.0)
Immature Granulocytes: 1 %
Lymphocytes Relative: 8 %
Lymphs Abs: 0.6 10*3/uL — ABNORMAL LOW (ref 0.7–4.0)
MCH: 31.4 pg (ref 26.0–34.0)
MCHC: 33.6 g/dL (ref 30.0–36.0)
MCV: 93.5 fL (ref 80.0–100.0)
Monocytes Absolute: 0.5 10*3/uL (ref 0.1–1.0)
Monocytes Relative: 6 %
Neutro Abs: 7.2 10*3/uL (ref 1.7–7.7)
Neutrophils Relative %: 85 %
Platelets: 143 10*3/uL — ABNORMAL LOW (ref 150–400)
RBC: 4.01 MIL/uL — ABNORMAL LOW (ref 4.22–5.81)
RDW: 12.7 % (ref 11.5–15.5)
WBC: 8.4 10*3/uL (ref 4.0–10.5)
nRBC: 0 % (ref 0.0–0.2)

## 2020-03-25 LAB — PHOSPHORUS: Phosphorus: 4.6 mg/dL (ref 2.5–4.6)

## 2020-03-25 LAB — MAGNESIUM: Magnesium: 1.6 mg/dL — ABNORMAL LOW (ref 1.7–2.4)

## 2020-03-25 LAB — FIBRIN DERIVATIVES D-DIMER (ARMC ONLY): Fibrin derivatives D-dimer (ARMC): 393.73 ng/mL (FEU) (ref 0.00–499.00)

## 2020-03-25 LAB — C-REACTIVE PROTEIN: CRP: 6.3 mg/dL — ABNORMAL HIGH (ref <1.0)

## 2020-03-25 LAB — GLUCOSE, CAPILLARY
Glucose-Capillary: 136 mg/dL — ABNORMAL HIGH (ref 70–99)
Glucose-Capillary: 172 mg/dL — ABNORMAL HIGH (ref 70–99)
Glucose-Capillary: 148 mg/dL — ABNORMAL HIGH (ref 70–99)

## 2020-03-25 LAB — FERRITIN: Ferritin: 159 ng/mL (ref 24–336)

## 2020-03-25 MED ORDER — MAGNESIUM SULFATE 2 GM/50ML IV SOLN
2.0000 g | Freq: Once | INTRAVENOUS | Status: AC
  Administered 2020-03-25: 15:00:00 2 g via INTRAVENOUS
  Filled 2020-03-25: qty 50

## 2020-03-25 NOTE — Care Management Important Message (Signed)
Important Message  Patient Details  Name: Roy Hernandez MRN: [id number] Date of Birth: 06/17/1928   Medicare Important Message Given:  Yes  Reviewed with daughter, Roy Hernandez, via phone.  Copy of Medicare IM sent securely to email address provided: [email or web address].     Dannette Barbara 03/25/2020, 11:58 AM

## 2020-03-25 NOTE — Progress Notes (Signed)
   03/25/20 1930  Vitals  BP (!) 94/46 (Paged Hospitalist )  MAP (mmHg) (!) 62  Pulse Rate 76  Level of Consciousness  Level of Consciousness Responds to Voice  MEWS COLOR  MEWS Score Color Yellow  Oxygen Therapy  SpO2 92 %  O2 Device Nasal Cannula  O2 Flow Rate (L/min) 2 L/min  Pain Assessment  Pain Scale PAINAD  Pain Score 0  PAINAD (Pain Assessment in Advanced Dementia)  Breathing 0  Negative Vocalization 0  Facial Expression 0  Body Language 0  Consolability 0  PAINAD Score 0  PCA/Epidural/Spinal Assessment  Respiratory Pattern Regular;Unlabored  Glasgow Coma Scale  Eye Opening 4  Best Verbal Response (NON-intubated) 5  Best Motor Response 6  Glasgow Coma Scale Score 15  MEWS Score  MEWS Temp 0  MEWS Systolic 1  MEWS Pulse 0  MEWS RR 0  MEWS LOC 1  MEWS Score 2   Paged Ouma, NP regarding pt's low blood pressure. Appears to be soft (90/50's) since about 3pm. Dayshift nurse expressed concern but said that no new orders were given. Ouma, NP advised to keep an eye on BP as it looks like pt normally runs soft. She advised to let her know if pt's systolic dropped below 90 and if the MAP dropped below 60. Will continue to monitor.

## 2020-03-25 NOTE — Progress Notes (Addendum)
PROGRESS NOTE  Roy Hernandez TSV:[id number] DOB: [birth year] DOA: 03/22/2020 PCP: Roy Sizer, MD  Brief History   55yom PMH dementia, fully vaccinated, presented with shortness of breath.  Admitted for acute hypoxic respiratory failure secondary to Covid pneumonia, with associated hypotension.   A & P  Acute hypoxemic respiratory failure due to COVID-19 Roy Hernandez) --03/22/20 20:36 O2 Sat 85% --8/21 CXR Patchy areas of interstitial and airspace opacity on a  background of pulmonary emphysema --Appears stable. --oxygen: RA --inflammatory markers . Ferritin 80 > 162 > 159 . CRP 2.9 > 4.5 > 6.3 . Fibrin derivatives 743 > 495 > 393  --Tx . Remdesiver 8/22 > 8/26. Not a candidate for outpt infusion per Outpt Infusion team secondary to dementia, inability to sit still and reliably follow commands . Steroids 8/22 >  . baricitinim > not indicated currently based on clinical status. Inflammatory markers mixed. Will continue to monitor. Discussed w/ daughter who consented to use if needed  Pneumonia due to COVID-19 virus --plan as above  Dementia (Roy Hernandez) --appears stable; continue donepezil, memantine  Thrombocytopenia (Roy Hernandez) --secondary to acute illness, resolving; no further evaluation  Disposition Plan:  Discussion: appears to be improving; continue current Rx, follow lab work; if better 8/26 > home after last dose remdesivir  Status is: Inpatient  Remains inpatient appropriate because:IV treatments appropriate due to intensity of illness or inability to take PO and Inpatient level of care appropriate due to severity of illness  Dispo: The patient is from: Home              Anticipated d/c is to: Home daughter and son can care for at home and will be ready to take him home on discharge              Anticipated d/c date is: 1 day              Patient currently is not medically stable to d/c.  DVT prophylaxis: enoxaparin (LOVENOX) injection 40 mg Start: 03/23/20 1000 Code Status:  Full Family Communication: updated oldest daughter Roy Hernandez by telephone 8/24  Roy Hodgkins, MD  Roy Hernandez Direct contact: see [email or web address] (further directions at bottom of note if needed) 7PM-7AM contact night coverage as at bottom of note 03/25/2020, 4:40 PM  Roy: 3 days   Significant Hernandez Events   .    Consults:  .    Procedures:  .   Significant Diagnostic Tests:  [name]    Micro Data:  .    Antimicrobials:  .   Interval History/Subjective  Confused  Objective   Vitals:  Vitals:   03/25/20 1100 03/25/20 1550  BP: (!) 103/49 (!) 92/57  Pulse: 73   Resp:  20  Temp: 98.2 F (36.8 C) 98.9 F (37.2 C)  SpO2: 90%     Exam:  Constitutional:   . Appears calm and comfortable ENMT:  . A little hard of hearing  Respiratory:  . CTA bilaterally, no w/r/r.  . Respiratory effort normal.  Cardiovascular:  . RRR, no m/r/g . No LE extremity edema   Psychiatric:  . Mental status o Mood, affect appropriate  I have personally reviewed the following:   Today's Data  . CBG remains stable  . Mg 1.6 . plts 102 > 114 > 143  Scheduled Meds: . acetaminophen  500 mg Oral QHS  . vitamin C  500 mg Oral Daily  . aspirin EC  81 mg Oral QHS  . cholecalciferol  1,000 Units Oral Daily  .  donepezil  10 mg Oral QHS  . enoxaparin (LOVENOX) injection  40 mg Subcutaneous Q24H  . finasteride  5 mg Oral Daily  . fluticasone  2 spray Each Nare Daily  . guaiFENesin  600 mg Oral BID  . insulin aspart  0-9 Units Subcutaneous TID WC  . latanoprost  1 drop Both Eyes QHS  . loratadine  10 mg Oral Daily  . memantine  5 mg Oral BID  . methylPREDNISolone (SOLU-MEDROL) injection  60 mg Intravenous Q12H  . simvastatin  5 mg Oral QHS  . sodium chloride flush  3 mL Intravenous Q12H  . tamsulosin  0.8 mg Oral Daily  . timolol  1 drop Both Eyes Daily  . vitamin B-12  1,000 mcg Oral Daily  . zinc sulfate  220 mg Oral Daily   Continuous Infusions: . remdesivir 100 mg in NS 100  mL 100 mg (03/25/20 1119)    Principal Problem:   Acute hypoxemic respiratory failure due to COVID-19 Roy Hernandez) Active Problems:   Pneumonia due to COVID-19 virus   Dementia (Roy Hernandez)   Thrombocytopenia (Roy Hernandez)   COVID-19   Roy: 3 days   How to contact the Roy Hernandez [place] Attending or Consulting provider [place] or covering provider during after hours Roy Hernandez, for this patient?  1. Check the care team in Roy Hernandez and look for a) attending/consulting TRH provider listed and b) the Roy Hernandez team listed 2. Log into [email or web address] and use [place]'s universal password to access. If you do not have the password, please contact the Hernandez operator. 3. Locate the [place] [facility] provider you are looking for under Roy Hernandez and page to a number that you can be directly reached. 4. If you still have difficulty reaching the provider, please page the [facility] (Director on Call) for the Hernandez listed on amion for assistance.

## 2020-03-26 LAB — GLUCOSE, CAPILLARY
Glucose-Capillary: 158 mg/dL — ABNORMAL HIGH (ref 70–99)
Glucose-Capillary: 131 mg/dL — ABNORMAL HIGH (ref 70–99)
Glucose-Capillary: 122 mg/dL — ABNORMAL HIGH (ref 70–99)
Glucose-Capillary: 114 mg/dL — ABNORMAL HIGH (ref 70–99)

## 2020-03-26 LAB — COMPREHENSIVE METABOLIC PANEL
ALT: 12 U/L (ref 0–44)
AST: 22 U/L (ref 15–41)
Albumin: 2.9 g/dL — ABNORMAL LOW (ref 3.5–5.0)
Alkaline Phosphatase: 61 U/L (ref 38–126)
Anion gap: 11 (ref 5–15)
BUN: 28 mg/dL — ABNORMAL HIGH (ref 8–23)
CO2: 29 mmol/L (ref 22–32)
Calcium: 8.8 mg/dL — ABNORMAL LOW (ref 8.9–10.3)
Chloride: 96 mmol/L — ABNORMAL LOW (ref 98–111)
Creatinine, Ser: 0.7 mg/dL (ref 0.61–1.24)
GFR calc Af Amer: >60 mL/min (ref >60)
GFR calc non Af Amer: >60 mL/min (ref >60)
Glucose, Bld: 151 mg/dL — ABNORMAL HIGH (ref 70–99)
Potassium: 4 mmol/L (ref 3.5–5.1)
Sodium: 136 mmol/L (ref 135–145)
Total Bilirubin: 1 mg/dL (ref 0.3–1.2)
Total Protein: 6.2 g/dL — ABNORMAL LOW (ref 6.5–8.1)

## 2020-03-26 LAB — CBC WITH DIFFERENTIAL/PLATELET
Abs Immature Granulocytes: 0.04 10*3/uL (ref 0.00–0.07)
Basophils Absolute: 0 10*3/uL (ref 0.0–0.1)
Basophils Relative: 0 %
Eosinophils Absolute: 0 10*3/uL (ref 0.0–0.5)
Eosinophils Relative: 0 %
HCT: 39 % (ref 39.0–52.0)
Hemoglobin: 13.7 g/dL (ref 13.0–17.0)
Immature Granulocytes: 1 %
Lymphocytes Relative: 8 %
Lymphs Abs: 0.5 10*3/uL — ABNORMAL LOW (ref 0.7–4.0)
MCH: 32 pg (ref 26.0–34.0)
MCHC: 35.1 g/dL (ref 30.0–36.0)
MCV: 91.1 fL (ref 80.0–100.0)
Monocytes Absolute: 0.3 10*3/uL (ref 0.1–1.0)
Monocytes Relative: 5 %
Neutro Abs: 5.5 10*3/uL (ref 1.7–7.7)
Neutrophils Relative %: 86 %
Platelets: 146 10*3/uL — ABNORMAL LOW (ref 150–400)
RBC: 4.28 MIL/uL (ref 4.22–5.81)
RDW: 12.9 % (ref 11.5–15.5)
WBC: 6.4 10*3/uL (ref 4.0–10.5)
nRBC: 0 % (ref 0.0–0.2)

## 2020-03-26 LAB — PHOSPHORUS: Phosphorus: 3.1 mg/dL (ref 2.5–4.6)

## 2020-03-26 LAB — MAGNESIUM: Magnesium: 2.2 mg/dL (ref 1.7–2.4)

## 2020-03-26 LAB — C-REACTIVE PROTEIN: CRP: 7.9 mg/dL — ABNORMAL HIGH (ref <1.0)

## 2020-03-26 LAB — FERRITIN: Ferritin: 207 ng/mL (ref 24–336)

## 2020-03-26 LAB — FIBRIN DERIVATIVES D-DIMER (ARMC ONLY): Fibrin derivatives D-dimer (ARMC): 312.84 ng/mL (FEU) (ref 0.00–499.00)

## 2020-03-26 NOTE — Evaluation (Signed)
Physical Therapy Evaluation Patient Details Name: Roy Hernandez MRN: [id number] DOB: 01-Apr-1928 Today's Date: 03/26/2020   History of Present Illness  Roy Hernandez is a 63yom PMH dementia, fully vaccinated, presented with shortness of breath.  Admitted for acute hypoxic respiratory failure secondary to Covid pneumonia, with associated hypotension. At baseline pt lives alone at home with heavy assist from family. Uses a RW for household distance AMB. Requires assistance for ADL due to advanced dementia.  Clinical Impression  Pt admitted with above diagnosis. Pt currently with functional limitations due to the deficits listed below (see "PT Problem List"). Author unable to evaluate patient on 8/23 due to significant AMS. Author able to reach DTR [name] via telephone prior to session, able to obtain detailed baseline info on patient, home setup, hear DTR's concerns, and family's goals/desires for the patient. Author unsuccessful in addressing all of DTR's concerns as [name] was not permitted to give full response, which is unfortunate as some miscommunications continue to warrant clarification. Upon entry, pt in bed, awake, watching TV, appears comfortable. Pt better able to be interactive in a meaningful way this date, typically his immediate response is on topic, then the next several sentences are typically tangential and often reveal his disorientation. Pt is able to perform all basic mobility (bed mobility, transfers, and AMB) without more than intermittent minA for intermittent correction of postural sway. Pt requires heavy multimodal cuing and frequent redirection to attend to basic tasks. He has awareness of lines/leads and makes efforts to protect them. Pt does well with repeated transfers from EOB (x10) and AMB in room, reports to feel good, denies any pain, dizziness, SOB, has intermittent desaturation to 88% on room air. Functional mobility assessment demonstrates increased effort/time requirements,  generally good tolerance, and need for intermittent minimal physical assistance, whereas the patient performed these at a higher level of independence PTA. Per DTR's report pt's mentation and basic mobility are likely quite close to baseline, however suspect his balance to be somewhat impaired. Given these new balance issues and pt's limited awareness, he will need 24/7 supervision at DC until instability has resolved to baseline level. Pt will benefit from skilled PT intervention to increase independence and safety with basic mobility in preparation for discharge to the venue listed below. DTR has indicated that she would like have pt DC back to home and provide assistance once he is ready, although she makes known that she would not be able to provide heavy physical assistance.   At end of session, author dials DTR's mobile phone with pt's room phone and gives phone to patient. Pt appears to be using phone in a meaningful way for several minutes until call is ended. This is done at request of DTR per earlier conversation. RN made aware of DTR's difficulty communicating with patient via telephone whilst admitted.      Follow Up Recommendations Home health PT;Supervision/Assistance - 24 hour;Supervision for mobility/OOB;Other (comment) (STR would not be optimal given advanced dementia; would also result in more deconditioning/sedentary behavior than if returning to home.)    Equipment Recommendations  None recommended by PT    Recommendations for Other Services       Precautions / Restrictions Precautions Precautions: Fall Restrictions Weight Bearing Restrictions: No      Mobility  Bed Mobility Overal bed mobility: Modified Independent             General bed mobility comments: heavy inititation, multimodal cues required, easily distracted but redirected; requires some physical inititation of legs  due to initital confusion over acitivty  Transfers Overall transfer level: Needs  assistance Equipment used: Rolling walker (2 wheeled) Transfers: Sit to/from Stand Sit to Stand: Supervision         General transfer comment: labored inititally, but by end of session, can rapidly rise to standing 10 times consecutively, struggles most with some postural leaning and decreased awareness, but this improves some over course of session.  Ambulation/Gait Ambulation/Gait assistance: Min guard;Min assist Gait Distance (Feet): 40 Feet Assistive device: Rolling walker (2 wheeled) Gait Pattern/deviations: Leaning posteriorly;Drifts right/left     General Gait Details: generally confused about IV pole constantly in his way (concerned with safety), but able to be rediferected for taking steps with RW in room; struggles to maintain balance with RW independently without intermittent sway, author provides intermittinet MinAssist to for righting. Pt has delayed awareness of sway.  Stairs            Wheelchair Mobility    Modified Rankin (Stroke Patients Only)       Balance Overall balance assessment: Needs assistance   Sitting balance-[name]: Good Sitting balance - Comments: occaionally collapses backward but mostly our of trunk fatigue and sitting preference; appears intentional   Standing balance support: Bilateral upper extremity supported;During functional activity Standing balance-[name]: Poor                               Pertinent Vitals/Pain Pain Assessment: Faces Faces Pain Scale: No hurt    Home Living Family/patient expects to be discharged to:: Private residence Living Arrangements: Alone Available Help at Discharge: Family (2 DTRs live within 10 miles, Son within 2 miles; also cousin) Type of Home: House         Home Equipment: Environmental consultant - 2 wheels      Prior Function Level of Independence: Needs assistance   Gait / Transfers Assistance Needed: limited household distance AMB c RW;  ADL's / Homemaking Assistance Needed:  Needs heavy support from family over last few months.  Comments: In the last few months: pt has required total care with ADL, heavy cognitive dissociation with tools of ADL, cannot perform with cues. Baseline bowel incontinence. Has had advanced dementia for several years.     Hand Dominance        Extremity/Trunk Assessment   Upper Extremity Assessment Upper Extremity Assessment: Generalized weakness;Overall [facility] for tasks assessed    Lower Extremity Assessment Lower Extremity Assessment: Generalized weakness;Overall WFL for tasks assessed       Communication   Communication: HOH  Cognition Arousal/Alertness: Awake/alert Behavior During Therapy: WFL for tasks assessed/performed Overall Cognitive Status: History of cognitive impairments - at baseline                                 General Comments: quite chatty, tangential but, easily redirected; attends to task fairly well; very concerned with safety of lines/leads      General Comments      Exercises     Assessment/Plan    PT Assessment Patient needs continued PT services  PT Problem List Decreased balance;Decreased mobility;Decreased safety awareness;Decreased cognition;Decreased knowledge of precautions       PT Treatment Interventions Balance training;Gait training;Stair training;Functional mobility training;Therapeutic activities;Therapeutic exercise;Patient/family education;Cognitive remediation    PT Goals (Current goals can be found in the Care Plan section)  Acute Rehab PT Goals Patient Stated Goal: avoid  deconditioning whilst admitted; PT Goal Formulation: With family Time For Goal Achievement: 04/09/20 Potential to Achieve Goals: Good    Frequency Min 2X/week   Barriers to discharge Decreased caregiver support Unclear if family will be able to provide 24/7 care    Co-evaluation               AM-PAC PT "6 Clicks" Mobility  Outcome Measure Help needed turning from your back  to your side while in a flat bed without using bedrails?: A Little Help needed moving from lying on your back to sitting on the side of a flat bed without using bedrails?: A Little Help needed moving to and from a bed to a chair (including a wheelchair)?: A Little Help needed standing up from a chair using your arms (e.g., wheelchair or bedside chair)?: A Little Help needed to walk in hospital room?: A Little Help needed climbing 3-5 steps with a railing? : A Little 6 Click Score: 18    End of Session   Activity Tolerance: Patient tolerated treatment well;No increased pain Patient left: in bed;with call bell/phone within reach;with bed alarm set;Other (comment) (sitting upright) Nurse Communication: Mobility status (author spoke to [employer] on the phone prior to session) PT Visit Diagnosis: Unsteadiness on feet (R26.81);Difficulty in walking, not elsewhere classified (R26.2);Other abnormalities of gait and mobility (R26.89)    Time: 4332-9518 PT Time Calculation (min) (ACUTE ONLY): 36 min   Charges:     PT Treatments $Therapeutic Exercise: 8-22 mins      5:21 PM, 03/26/20 Etta Grandchild, PT, DPT Physical Therapist - [facility]  [phone] ([place])     Florence C 03/26/2020, 5:06 PM

## 2020-03-26 NOTE — Progress Notes (Addendum)
PROGRESS NOTE    Roy Hernandez  HAL:[id number] DOB: 29-Aug-1927 DOA: 03/22/2020 PCP: [name], MD    Brief Narrative:  69yom PMH dementia, fully vaccinated, presented with shortness of breath.  Admitted for acute hypoxic respiratory failure secondary to Covid pneumonia, with associated hypotension.  8/25: Patient seen and examined.  Incontinent of stool.  This appears to be baseline.  Unable to provide any reliable history.  No oxygen requirement.  Hemodynamically stable.   Assessment & Plan:   Principal Problem:   Acute hypoxemic respiratory failure due to COVID-19 [facility]) Active Problems:   COVID-19   Pneumonia due to COVID-19 virus   Dementia ([place])   Thrombocytopenia (HCC)  Acute hypoxemic respiratory failure due to COVID-19 [facility])  --03/22/20 20:36 O2 Sat 85% --8/21 CXR Patchy areas of interstitial and airspace opacity on a  background of pulmonary emphysema --Appears stable. --oxygen: RA --inflammatory markers  Ferritin 80 > 162 > 159 >207  CRP 2.9 > 4.5 > 6.3 > 7.9  Fibrin derivatives 743 > 495 > 393 >312 --Tx  Remdesiver 8/22 > 8/26. Not a candidate for outpt infusion per Outpt Infusion team secondary to dementia, inability to sit still and reliably follow commands  Steroids 8/22 >   baricitinim > not indicated currently based on clinical status. Inflammatory markers mixed. Will continue to monitor. Discussed w/ daughter who consented to use if needed  We will plan on completing remdesivir course in house.  Last dose 03/27/2020.  Pharmacy reschedule for morning.  Patient remained stable can discharge home in the care of daughter on 03/27/2020  Pneumonia due to COVID-19 virus --plan as above  Dementia ([place]) --appears stable; continue donepezil, memantine  Thrombocytopenia ([place]) --secondary to acute illness, resolving; no further evaluation   DVT prophylaxis: Lovenox Code Status: Full Family Communication: Daughter [name] [phone] on  03/26/2020 Disposition Plan: Status is: Inpatient  Remains inpatient appropriate because:Inpatient level of care appropriate due to severity of illness   Dispo: The patient is from: Home              Anticipated d/c is to: Home              Anticipated d/c date is: 1 day              Patient currently is not medically stable to d/c. Patient will complete last dose of remdesivir in house tomorrow 03/27/2020.  Can discharge home afterwards.        Consultants:   None  Procedures:   None  Antimicrobials:   Remdesivir   Subjective: Patient seen and examined.  No distress.  No pain complaints.  Objective: Vitals:   03/26/20 0842 03/26/20 0900 03/26/20 1000 03/26/20 1227  BP: (!) 108/93 114/62 125/70 122/70  Pulse: 90     Resp: 16     Temp: 98.1 F (36.7 C)   98.2 F (36.8 C)  TempSrc: Oral   Oral  SpO2: 98%     Weight:      Height:        Intake/Output Summary (Last 24 hours) at 03/26/2020 1325 Last data filed at 03/25/2020 1600 Gross per 24 hour  Intake 170 ml  Output --  Net 170 ml   Filed Weights   03/22/20 2037 03/24/20 0809  Weight: 73.5 kg 73.5 kg    Examination:  General exam: No apparent distress.  Appears stated age.  Appears frail Respiratory system: Mild bibasilar crackles.  Normal work of breathing.  Room air Cardiovascular system: S1 &  S2 heard, RRR. No JVD, murmurs, rubs, gallops or clicks. No pedal edema. Gastrointestinal system: Abdomen is nondistended, soft and nontender. No organomegaly or masses felt. Normal bowel sounds heard. Central nervous system: Alert, oriented to person.. No focal neurological deficits. Extremities: Symmetric 5 x 5 power. Skin: No rashes, lesions or ulcers Psychiatry: Confused.  Flattened affect    Data Reviewed: I have personally reviewed following labs and imaging studies  CBC: Recent Labs  Lab 03/22/20 2040 03/23/20 0629 03/24/20 0444 03/25/20 0514 03/26/20 0621  WBC 7.8 5.9 4.8 8.4 6.4   NEUTROABS 6.5 4.7 3.9 7.2 5.5  HGB 11.7* 11.5* 12.8* 12.6* 13.7  HCT 35.4* 34.2* 38.1* 37.5* 39.0  MCV 96.2 95.5 94.3 93.5 91.1  PLT 156 102* 114* 143* 578*   Basic Metabolic Panel: Recent Labs  Lab 03/22/20 2040 03/23/20 0629 03/24/20 0444 03/25/20 0514 03/26/20 0621  NA 131* 136  --   --  136  K 3.7 3.8  --   --  4.0  CL 94* 100  --   --  96*  CO2 27 26  --   --  29  GLUCOSE 139* 127*  --   --  151*  BUN 23 17  --   --  28*  CREATININE 0.92 0.88  --   --  0.70  CALCIUM 8.8* 8.7*  --   --  8.8*  MG  --  1.7 1.8 1.6* 2.2  PHOS  --  2.9 2.9 4.6 3.1   GFR: Estimated Creatinine Clearance: 57.4 mL/min (by C-G formula based on SCr of 0.7 mg/dL). Liver Function Tests: Recent Labs  Lab 03/22/20 2040 03/26/20 0621  AST 15 22  ALT 10 12  ALKPHOS 78 61  BILITOT 0.5 1.0  PROT 6.5 6.2*  ALBUMIN 3.5 2.9*   No results for input(s): LIPASE, AMYLASE in the last 168 hours. No results for input(s): AMMONIA in the last 168 hours. Coagulation Profile: Recent Labs  Lab 03/22/20 2040  INR 1.0   Cardiac Enzymes: No results for input(s): CKTOTAL, CKMB, CKMBINDEX, TROPONINI in the last 168 hours. BNP (last 3 results) No results for input(s): PROBNP in the last 8760 hours. HbA1C: No results for input(s): HGBA1C in the last 72 hours. CBG: Recent Labs  Lab 03/25/20 0939 03/25/20 1223 03/25/20 1735 03/26/20 0834 03/26/20 1207  GLUCAP 136* 172* 148* 158* 131*   Lipid Profile: No results for input(s): CHOL, HDL, LDLCALC, TRIG, CHOLHDL, LDLDIRECT in the last 72 hours. Thyroid Function Tests: No results for input(s): TSH, T4TOTAL, FREET4, T3FREE, THYROIDAB in the last 72 hours. Anemia Panel: Recent Labs    03/25/20 0514 03/26/20 0621  FERRITIN 159 207   Sepsis Labs: Recent Labs  Lab 03/22/20 2040 03/22/20 2230  PROCALCITON <0.10  --   LATICACIDVEN 1.2 1.5    Recent Results (from the past 240 hour(s))  Culture, blood (Routine x 2)     Status: None (Preliminary  result)   Collection Time: 03/22/20  8:40 PM   Specimen: BLOOD  Result Value Ref Range Status   Specimen Description BLOOD BLOOD LEFT WRIST  Final   Special Requests   Final    BOTTLES DRAWN AEROBIC AND ANAEROBIC Blood Culture results may not be optimal due to an excessive volume of blood received in culture bottles   Culture   Final    NO GROWTH 4 DAYS Performed at [facility], [address], [place], [place] [address]    Report Status PENDING  Incomplete  Culture,  blood (Routine x 2)     Status: None (Preliminary result)   Collection Time: 03/22/20  8:50 PM   Specimen: BLOOD  Result Value Ref Range Status   Specimen Description BLOOD BLOOD RIGHT ARM  Final   Special Requests   Final    BOTTLES DRAWN AEROBIC AND ANAEROBIC Blood Culture adequate volume   Culture   Final    NO GROWTH 4 DAYS Performed at [facility], [address], [place], [place] [address]    Report Status PENDING  Incomplete  SARS Coronavirus 2 by RT PCR (hospital order, performed in [place] hospital lab) Nasopharyngeal Nasopharyngeal Swab     Status: Abnormal   Collection Time: 03/22/20  8:51 PM   Specimen: Nasopharyngeal Swab  Result Value Ref Range Status   SARS Coronavirus 2 POSITIVE (A) NEGATIVE Final    Comment: RESULT CALLED TO, READ BACK BY AND VERIFIED WITH: L FERGUSON 03/22/20 AT 2232 MJU (NOTE) SARS-CoV-2 target nucleic acids are DETECTED  SARS-CoV-2 RNA is generally detectable in upper respiratory specimens  during the acute phase of infection.  Positive results are indicative  of the presence of the identified virus, but do not rule out bacterial infection or co-infection with other pathogens not detected by the test.  Clinical correlation with patient history and  other diagnostic information is necessary to determine patient infection status.  The expected result is negative.  Fact Sheet for Patients:   StrictlyIdeas.no   Fact Sheet for  Healthcare Providers:   BankingDealers.co.za    This test is not yet approved or cleared by the [place] FDA and  has been authorized for detection and/or diagnosis of SARS-CoV-2 by FDA under an Emergency Use Authorization (EUA).  This EUA will remain in effect (meaning this test c an be used) for the duration of  the COVID-19 declaration under Section 564(b)(1) of the Act, 21 U.S.C. section 360-bbb-3(b)(1), unless the authorization is terminated or revoked sooner.  Performed at [facility], [address], [place], [place] [address]          Radiology Studies: No results found.      Scheduled Meds: . acetaminophen  500 mg Oral QHS  . vitamin C  500 mg Oral Daily  . aspirin EC  81 mg Oral QHS  . cholecalciferol  1,000 Units Oral Daily  . enoxaparin (LOVENOX) injection  40 mg Subcutaneous Q24H  . finasteride  5 mg Oral Daily  . fluticasone  2 spray Each Nare Daily  . guaiFENesin  600 mg Oral BID  . insulin aspart  0-9 Units Subcutaneous TID WC  . latanoprost  1 drop Both Eyes QHS  . loratadine  10 mg Oral Daily  . memantine  5 mg Oral BID  . methylPREDNISolone (SOLU-MEDROL) injection  60 mg Intravenous Q12H  . simvastatin  5 mg Oral QHS  . sodium chloride flush  3 mL Intravenous Q12H  . tamsulosin  0.8 mg Oral Daily  . timolol  1 drop Both Eyes Daily  . vitamin B-12  1,000 mcg Oral Daily  . zinc sulfate  220 mg Oral Daily   Continuous Infusions: . remdesivir 100 mg in NS 100 mL 100 mg (03/26/20 1054)     LOS: 4 days    Time spent: 15 minutes    Sidney Ace, MD [facility] Pager [phone]  If 7PM-7AM, please contact night-coverage 03/26/2020, 1:25 PM

## 2020-03-27 DIAGNOSIS — Z83438 Family history of other disorder of lipoprotein metabolism and other lipidemia: Secondary | ICD-10-CM | POA: Diagnosis not present

## 2020-03-27 DIAGNOSIS — I10 Essential (primary) hypertension: Secondary | ICD-10-CM | POA: Diagnosis present

## 2020-03-27 DIAGNOSIS — D692 Other nonthrombocytopenic purpura: Secondary | ICD-10-CM | POA: Diagnosis not present

## 2020-03-27 DIAGNOSIS — F0390 Unspecified dementia without behavioral disturbance: Secondary | ICD-10-CM | POA: Diagnosis not present

## 2020-03-27 DIAGNOSIS — R404 Transient alteration of awareness: Secondary | ICD-10-CM | POA: Diagnosis not present

## 2020-03-27 DIAGNOSIS — N179 Acute kidney failure, unspecified: Secondary | ICD-10-CM | POA: Diagnosis not present

## 2020-03-27 DIAGNOSIS — Z85828 Personal history of other malignant neoplasm of skin: Secondary | ICD-10-CM | POA: Diagnosis not present

## 2020-03-27 DIAGNOSIS — D6959 Other secondary thrombocytopenia: Secondary | ICD-10-CM | POA: Diagnosis not present

## 2020-03-27 DIAGNOSIS — E860 Dehydration: Secondary | ICD-10-CM | POA: Diagnosis not present

## 2020-03-27 DIAGNOSIS — Z7983 Long term (current) use of bisphosphonates: Secondary | ICD-10-CM | POA: Diagnosis not present

## 2020-03-27 DIAGNOSIS — R5381 Other malaise: Secondary | ICD-10-CM | POA: Diagnosis not present

## 2020-03-27 DIAGNOSIS — Z833 Family history of diabetes mellitus: Secondary | ICD-10-CM | POA: Diagnosis not present

## 2020-03-27 DIAGNOSIS — R52 Pain, unspecified: Secondary | ICD-10-CM | POA: Diagnosis not present

## 2020-03-27 DIAGNOSIS — Z515 Encounter for palliative care: Secondary | ICD-10-CM | POA: Diagnosis not present

## 2020-03-27 DIAGNOSIS — H409 Unspecified glaucoma: Secondary | ICD-10-CM | POA: Diagnosis not present

## 2020-03-27 DIAGNOSIS — U071 COVID-19: Secondary | ICD-10-CM | POA: Diagnosis not present

## 2020-03-27 DIAGNOSIS — J1282 Pneumonia due to coronavirus disease 2019: Secondary | ICD-10-CM | POA: Diagnosis not present

## 2020-03-27 DIAGNOSIS — Z86006 Personal history of melanoma in-situ: Secondary | ICD-10-CM | POA: Diagnosis not present

## 2020-03-27 DIAGNOSIS — E785 Hyperlipidemia, unspecified: Secondary | ICD-10-CM | POA: Diagnosis present

## 2020-03-27 DIAGNOSIS — I739 Peripheral vascular disease, unspecified: Secondary | ICD-10-CM | POA: Diagnosis not present

## 2020-03-27 DIAGNOSIS — J9601 Acute respiratory failure with hypoxia: Secondary | ICD-10-CM | POA: Diagnosis not present

## 2020-03-27 DIAGNOSIS — R4020 Unspecified coma: Secondary | ICD-10-CM | POA: Diagnosis not present

## 2020-03-27 DIAGNOSIS — I447 Left bundle-branch block, unspecified: Secondary | ICD-10-CM | POA: Diagnosis present

## 2020-03-27 DIAGNOSIS — Z8249 Family history of ischemic heart disease and other diseases of the circulatory system: Secondary | ICD-10-CM | POA: Diagnosis not present

## 2020-03-27 DIAGNOSIS — M810 Age-related osteoporosis without current pathological fracture: Secondary | ICD-10-CM | POA: Diagnosis present

## 2020-03-27 DIAGNOSIS — I7090 Unspecified atherosclerosis: Secondary | ICD-10-CM | POA: Diagnosis not present

## 2020-03-27 DIAGNOSIS — R4182 Altered mental status, unspecified: Secondary | ICD-10-CM | POA: Diagnosis not present

## 2020-03-27 DIAGNOSIS — E1151 Type 2 diabetes mellitus with diabetic peripheral angiopathy without gangrene: Secondary | ICD-10-CM | POA: Diagnosis not present

## 2020-03-27 DIAGNOSIS — Z841 Family history of disorders of kidney and ureter: Secondary | ICD-10-CM | POA: Diagnosis not present

## 2020-03-27 DIAGNOSIS — M1990 Unspecified osteoarthritis, unspecified site: Secondary | ICD-10-CM | POA: Diagnosis present

## 2020-03-27 DIAGNOSIS — I714 Abdominal aortic aneurysm, without rupture: Secondary | ICD-10-CM | POA: Diagnosis not present

## 2020-03-27 DIAGNOSIS — E872 Acidosis: Secondary | ICD-10-CM | POA: Diagnosis not present

## 2020-03-27 DIAGNOSIS — J189 Pneumonia, unspecified organism: Secondary | ICD-10-CM | POA: Diagnosis not present

## 2020-03-27 DIAGNOSIS — Z66 Do not resuscitate: Secondary | ICD-10-CM | POA: Diagnosis not present

## 2020-03-27 DIAGNOSIS — G9389 Other specified disorders of brain: Secondary | ICD-10-CM | POA: Diagnosis not present

## 2020-03-27 DIAGNOSIS — Z79899 Other long term (current) drug therapy: Secondary | ICD-10-CM | POA: Diagnosis not present

## 2020-03-27 DIAGNOSIS — I959 Hypotension, unspecified: Secondary | ICD-10-CM | POA: Diagnosis not present

## 2020-03-27 DIAGNOSIS — I4891 Unspecified atrial fibrillation: Secondary | ICD-10-CM | POA: Diagnosis not present

## 2020-03-27 DIAGNOSIS — Z7982 Long term (current) use of aspirin: Secondary | ICD-10-CM | POA: Diagnosis not present

## 2020-03-27 LAB — CULTURE, BLOOD (ROUTINE X 2)
Culture: NO GROWTH
Culture: NO GROWTH
Special Requests: ADEQUATE

## 2020-03-27 LAB — CBC WITH DIFFERENTIAL/PLATELET
Abs Immature Granulocytes: 0.01 10*3/uL (ref 0.00–0.07)
Basophils Absolute: 0 10*3/uL (ref 0.0–0.1)
Basophils Relative: 0 %
Eosinophils Absolute: 0 10*3/uL (ref 0.0–0.5)
Eosinophils Relative: 0 %
HCT: 41 % (ref 39.0–52.0)
Hemoglobin: 14.3 g/dL (ref 13.0–17.0)
Immature Granulocytes: 0 %
Lymphocytes Relative: 10 %
Lymphs Abs: 0.6 10*3/uL — ABNORMAL LOW (ref 0.7–4.0)
MCH: 31.8 pg (ref 26.0–34.0)
MCHC: 34.9 g/dL (ref 30.0–36.0)
MCV: 91.3 fL (ref 80.0–100.0)
Monocytes Absolute: 0.3 10*3/uL (ref 0.1–1.0)
Monocytes Relative: 4 %
Neutro Abs: 5.2 10*3/uL (ref 1.7–7.7)
Neutrophils Relative %: 86 %
Platelets: 164 10*3/uL (ref 150–400)
RBC: 4.49 MIL/uL (ref 4.22–5.81)
RDW: 12.8 % (ref 11.5–15.5)
WBC: 6.1 10*3/uL (ref 4.0–10.5)
nRBC: 0 % (ref 0.0–0.2)

## 2020-03-27 LAB — GLUCOSE, CAPILLARY
Glucose-Capillary: 162 mg/dL — ABNORMAL HIGH (ref 70–99)
Glucose-Capillary: 167 mg/dL — ABNORMAL HIGH (ref 70–99)
Glucose-Capillary: 134 mg/dL — ABNORMAL HIGH (ref 70–99)

## 2020-03-27 LAB — PHOSPHORUS: Phosphorus: 3.3 mg/dL (ref 2.5–4.6)

## 2020-03-27 LAB — COMPREHENSIVE METABOLIC PANEL
ALT: 15 U/L (ref 0–44)
AST: 23 U/L (ref 15–41)
Albumin: 3.1 g/dL — ABNORMAL LOW (ref 3.5–5.0)
Alkaline Phosphatase: 62 U/L (ref 38–126)
Anion gap: 10 (ref 5–15)
BUN: 30 mg/dL — ABNORMAL HIGH (ref 8–23)
CO2: 29 mmol/L (ref 22–32)
Calcium: 8.9 mg/dL (ref 8.9–10.3)
Chloride: 97 mmol/L — ABNORMAL LOW (ref 98–111)
Creatinine, Ser: 0.85 mg/dL (ref 0.61–1.24)
GFR calc Af Amer: >60 mL/min (ref >60)
GFR calc non Af Amer: >60 mL/min (ref >60)
Glucose, Bld: 160 mg/dL — ABNORMAL HIGH (ref 70–99)
Potassium: 4 mmol/L (ref 3.5–5.1)
Sodium: 136 mmol/L (ref 135–145)
Total Bilirubin: 1.1 mg/dL (ref 0.3–1.2)
Total Protein: 6.4 g/dL — ABNORMAL LOW (ref 6.5–8.1)

## 2020-03-27 LAB — C-REACTIVE PROTEIN: CRP: 3.9 mg/dL — ABNORMAL HIGH (ref <1.0)

## 2020-03-27 LAB — FIBRIN DERIVATIVES D-DIMER (ARMC ONLY): Fibrin derivatives D-dimer (ARMC): 483.49 ng/mL (FEU) (ref 0.00–499.00)

## 2020-03-27 LAB — FERRITIN: Ferritin: 208 ng/mL (ref 24–336)

## 2020-03-27 LAB — MAGNESIUM: Magnesium: 2.3 mg/dL (ref 1.7–2.4)

## 2020-03-27 MED ORDER — HALOPERIDOL LACTATE 5 MG/ML IJ SOLN
INTRAMUSCULAR | Status: AC
  Administered 2020-03-27: 07:00:00 5 mg via INTRAVENOUS
  Filled 2020-03-27: qty 1

## 2020-03-27 MED ORDER — ASCORBIC ACID 500 MG PO TABS: 500.0000 mg | ORAL_TABLET | Freq: Every day | ORAL | Status: AC

## 2020-03-27 MED ORDER — ZINC SULFATE 220 (50 ZN) MG PO CAPS: 220.0000 mg | ORAL_CAPSULE | Freq: Every day | ORAL | Status: AC

## 2020-03-27 MED ORDER — HALOPERIDOL LACTATE 5 MG/ML IJ SOLN: 5.0000 mg | Freq: Once | INTRAMUSCULAR | Status: AC

## 2020-03-27 MED ORDER — DEXAMETHASONE 6 MG PO TABS: 6.0000 mg | ORAL_TABLET | Freq: Every day | ORAL | 0 refills | Status: AC

## 2020-03-27 NOTE — TOC Progression Note (Signed)
Transition of Care [facility]) - Progression Note    Patient Details  Name: Roy Hernandez MRN: [id number] Date of Birth: May 26, 1928  Transition of [facility] CM/SW Contact  Shelbie Hutching, RN Phone Number: 03/27/2020, 11:08 AM  Clinical Narrative:    Patient's daughter, [name], believes that it will be a good idea for patient to go for rehab for a few days.  [name] has tested + for COVID.  [facility] has offered a bed and [name] accepts the bed offer.  [name] would like to pick up the patient and carry him over to [name].  Tanya at [name] is agreeable to family transport.  [name] reports that the patient can be admitted after 4pm today.     Expected Discharge Plan: [place] Barriers to Discharge: Continued Medical Work up  Expected Discharge Plan and Services Expected Discharge Plan: [place]   Discharge Planning Services: CM Consult Post Acute Care Choice: [place] Living arrangements for the past 2 months: Single Family Home                           HH Arranged: PT, OT [facility] Agency: [facility] [place] Date [place]: 03/24/20 Time [place]: [place] Representative spoke with at [place]: [place] ([place]) Interventions    Readmission Risk Interventions No flowsheet data found.

## 2020-03-27 NOTE — NC FL2 (Signed)
[place] LEVEL OF CARE SCREENING TOOL     IDENTIFICATION  Patient Name: Roy Hernandez Birthdate: 1927/11/25 Sex: male Admission Date (Current Location): 03/22/2020  [place] and [place] Number:  Engineering geologist and Address:  [facility], [address], [place], [place] [address]      Provider Number: [id number]  Attending Physician Name and Address:  Sidney Ace, MD  Relative Name and Phone Number:  [name] (daughter) [phone]    Current Level of Care: Hospital Recommended Level of Care: [place] Prior Approval Number:    Date Approved/Denied:   PASRR Number: waived  Discharge Plan: SNF    Current Diagnoses: Patient Active Problem List   Diagnosis Date Noted  . Acute hypoxemic respiratory failure due to COVID-19 ([place]) 03/23/2020  . Pneumonia due to COVID-19 virus 03/23/2020  . Hypotension 03/23/2020  . Dementia ([place]) 03/23/2020  . Thrombocytopenia ([place]) 03/23/2020  . COVID-19 03/22/2020  . Hypoxia 03/22/2020  . Dehydration 03/22/2020  . Senile purpura ([place]) 09/01/2018  . Neuropathy 06/16/2018  . 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 Left bundle-branch block 02/13/2018  . History of right inguinal hernia repair 12/16/2017  . Epiretinal membrane (ERM) of right eye 10/11/2017  . DJD (degenerative joint disease) 10/04/2017  . Hyperglycemia 10/04/2017  . Advanced stage glaucoma 10/04/2017  . Hypertension, benign 07/06/2017  . PAD (peripheral artery disease) ([place]) 10/19/2016  . Osteoporosis of femur without pathological fracture 11/25/2015  . AAA (abdominal aortic aneurysm) without rupture ([place]) 11/05/2014  . Abnormal ECG 11/05/2014  . At risk for falling 11/05/2014  . DD (diverticular disease) 11/05/2014  . Dyslipidemia 11/05/2014  . Failure of erection 11/05/2014  . Degenerative arthritis of lumbar spine 11/05/2014  . Compressed spine fracture ([place]) 11/05/2014  . Basal cell papilloma  11/05/2014  . Vitamin D deficiency 11/05/2014    Orientation RESPIRATION BLADDER Height & Weight     Self  Normal Incontinent Weight: 73.5 kg Height:  5\' 7"  (170.2 cm)  BEHAVIORAL SYMPTOMS/MOOD NEUROLOGICAL BOWEL NUTRITION STATUS      Incontinent Diet (see discharge summary)  AMBULATORY STATUS COMMUNICATION OF NEEDS Skin   Limited Assist Verbally Normal                       Personal Care Assistance Level of Assistance  Bathing, Feeding, Dressing Bathing Assistance: Limited assistance Feeding assistance: Limited assistance Dressing Assistance: Limited assistance     Functional Limitations Info  Hearing   Hearing Info: Impaired      SPECIAL CARE FACTORS FREQUENCY  PT (By licensed PT), OT (By licensed OT)     PT Frequency: 5 times per day OT Frequency: 5 times per day            Contractures Contractures Info: Not present    Additional Factors Info  Code Status, Allergies Code Status Info: Full Allergies Info: NKA           Current Medications (03/27/2020):  This is the current hospital active medication list Current Facility-Administered Medications  Medication Dose Route Frequency Provider Last Rate Last Admin  . acetaminophen (TYLENOL) tablet 650 mg  650 mg Oral Q6H PRN Lenna Sciara, MD       Or  . acetaminophen (TYLENOL) suppository 650 mg  650 mg Rectal Q6H PRN Lenna Sciara, MD      . acetaminophen (TYLENOL) tablet 500 mg  500 mg Oral QHS Lenna Sciara, MD   500 mg at 03/26/20 2030  .  ascorbic acid (VITAMIN C) tablet 500 mg  500 mg Oral Daily Samuella Cota, MD   500 mg at 03/26/20 1055  . aspirin EC tablet 81 mg  81 mg Oral QHS Lenna Sciara, MD   81 mg at 03/26/20 2030  . cholecalciferol (VITAMIN D) tablet 1,000 Units  1,000 Units Oral Daily Lenna Sciara, MD   1,000 Units at 03/26/20 1055  . enoxaparin (LOVENOX) injection 40 mg  40 mg Subcutaneous Q24H Lenna Sciara, MD   40 mg at 03/26/20 1055  . finasteride (PROSCAR) tablet 5 mg  5 mg  Oral Daily Lenna Sciara, MD   5 mg at 03/26/20 1055  . fluticasone (FLONASE) 50 MCG/ACT nasal spray 2 spray  2 spray Each Nare Daily Lenna Sciara, MD   2 spray at 03/26/20 1056  . guaiFENesin (MUCINEX) 12 hr tablet 600 mg  600 mg Oral BID Lenna Sciara, MD   600 mg at 03/26/20 2030  . insulin aspart (novoLOG) injection 0-9 Units  0-9 Units Subcutaneous TID WC Lenna Sciara, MD   2 Units at 03/26/20 1056  . latanoprost (XALATAN) 0.005 % ophthalmic solution 1 drop  1 drop Both Eyes QHS Lenna Sciara, MD   1 drop at 03/26/20 2031  . loratadine (CLARITIN) tablet 10 mg  10 mg Oral Daily Lenna Sciara, MD   10 mg at 03/26/20 1055  . memantine (NAMENDA) tablet 5 mg  5 mg Oral BID Lenna Sciara, MD   5 mg at 03/26/20 2030  . methylPREDNISolone sodium succinate (SOLU-MEDROL) 125 mg/2 mL injection 60 mg  60 mg Intravenous Q12H Samuella Cota, MD   60 mg at 03/26/20 2029  . polyvinyl alcohol (LIQUIFILM TEARS) 1.4 % ophthalmic solution 1 drop  1 drop Both Eyes TID PRN Lenna Sciara, MD      . remdesivir 100 mg in sodium chloride 0.9 % 100 mL IVPB  100 mg Intravenous Daily Lenna Sciara, MD 200 mL/hr at 03/26/20 1054 100 mg at 03/26/20 1054  . simvastatin (ZOCOR) tablet 5 mg  5 mg Oral QHS Lenna Sciara, MD   5 mg at 03/26/20 2031  . sodium chloride flush (NS) 0.9 % injection 3 mL  3 mL Intravenous Q12H Lenna Sciara, MD   3 mL at 03/26/20 2032  . tamsulosin (FLOMAX) capsule 0.8 mg  0.8 mg Oral Daily Lenna Sciara, MD   0.8 mg at 03/26/20 1055  . timolol (TIMOPTIC) 0.5 % ophthalmic solution 1 drop  1 drop Both Eyes Daily Lenna Sciara, MD   1 drop at 03/26/20 1056  . vitamin B-12 (CYANOCOBALAMIN) tablet 1,000 mcg  1,000 mcg Oral Daily Lenna Sciara, MD   1,000 mcg at 03/26/20 1055  . zinc sulfate capsule 220 mg  220 mg Oral Daily Samuella Cota, MD   220 mg at 03/26/20 1055     Discharge Medications: Please see discharge summary for a list of discharge medications.  Relevant Imaging  Results:  Relevant Lab Results:   Additional Information SS# [id number]  Shelbie Hutching, RN

## 2020-03-27 NOTE — TOC Transition Note (Signed)
Transition of Care [facility]) - CM/SW Discharge Note   Patient Details  Name: ZAMIRE WHITEHURST MRN: [id number] Date of Birth: Feb 28, 1928  Transition of Care [facility]) CM/SW Contact:  Shelbie Hutching, RN Phone Number: 03/27/2020, 2:30 PM   Clinical Narrative:    Patient is medically cleared for discharge to skilled nursing.  Patient is going to [facility], bedside RN will call report to [phone].  Family will be picking patient up at 5pm and transporting to the facility.     Final next level of care: Skilled Nursing Facility Barriers to Discharge: Barriers Resolved   Patient Goals and CMS Choice Patient states their goals for this hospitalization and ongoing recovery are:: daughter would like for the patient to go to SNF for a few days CMS Medicare.gov Compare Post Acute Care list provided to:: Patient Represenative (must comment) Choice offered to / list presented to : Adult Children  Discharge Placement              Patient [facility] bed at: [facility] Patient to be transferred to facility by: Family Name of family member notified: [name] Patient and family notified of of transfer: 03/27/20  Discharge Plan and Services   Discharge Planning Services: CM Consult Post Acute Care Choice: [place]                    HH Arranged: PT, OT [facility] Agency: [facility] Date [facility] Agency Contacted: 03/24/20 Time [place]: [place] Representative spoke with at [place]: [place] ([place]) Interventions     Readmission Risk Interventions No flowsheet data found.

## 2020-03-27 NOTE — Progress Notes (Signed)
Physical Therapy Treatment Patient Details Name: Roy Hernandez MRN: [id number] DOB: 02/24/1928 Today's Date: 03/27/2020    History of Present Illness Roy Hernandez is a 34yom PMH dementia, fully vaccinated, presented with shortness of breath.  Admitted for acute hypoxic respiratory failure secondary to Covid pneumonia, with associated hypotension. At baseline pt lives alone at home with heavy assist from family. Uses a RW for household distance AMB. Requires assistance for ADL due to advanced dementia.    PT Comments    Pt in bed upon entry, appears fairly sleepy/drowsy, but is interactive when spoken to. Pt given step-by-step narration of activity as [name] clears room and equipment to prepare for activity. Breakfast tray appears cut up and presented, less than 25% eaten. Pt offered several bites of pancakes, which he readily eats. Pt noted to have soiled bed with urine, RN and unit secretary made aware, wet draw sheet removed from bed. Pt appears to have removed his O2 sensor and doffed his nasal canula. Pt given tactile cues and minGuard assist to EOB, needs more extensive cuing for this. Pt rises to standing with RW and supervision, improved effort and improved balance acquisition this date, however the task is mildly more confusing this date, pt requires more cues and redirection to stand tall and use the RW safely. Pt guided through 50ft AMB, moves more naturally when IV pole is behind him and out of sight, but struggles with tight turns, requires mores facilitation of RW due to obstacles in room. Pt assisted back to bed after chair squats, falls asleep almost immediately. RN/MD updates on patient status and current needs at end of session.    Follow Up Recommendations  Home health PT;Supervision/Assistance - 24 hour;Supervision for mobility/OOB;Other (comment)     Equipment Recommendations  None recommended by PT    Recommendations for Other Services       Precautions / Restrictions  Precautions Precautions: Fall Restrictions Weight Bearing Restrictions: No    Mobility  Bed Mobility Overal bed mobility: Modified Independent             General bed mobility comments: requires some physical inititation of movement of legs, authro provides a hand hold so patient can pull self forward  Transfers Overall transfer level: Needs assistance Equipment used: Rolling walker (2 wheeled) Transfers: Sit to/from Stand Sit to Stand: Supervision         General transfer comment: improved sitting balance this date, no posterior sway 2/2/ fatigue as yesterday. Still having some difficulty with standing balance and sway, appears to be trying to lock RW as if it a rollator.  Ambulation/Gait Ambulation/Gait assistance: Min assist Gait Distance (Feet): 80 Feet Assistive device: Rolling walker (2 wheeled) Gait Pattern/deviations: Drifts right/left;Step-to pattern     General Gait Details: improved balance, less sway in straight plane, but struggles with tight turns in room, arguably not his fault as it is a very tight space in room (Covid isolation), author provides minA for RW for turning. Pt moves better today when IV pole is behind him and out of field of view.   Stairs             Wheelchair Mobility    Modified Rankin (Stroke Patients Only)       Balance Overall balance assessment: Needs assistance Sitting-balance support: Feet supported;No upper extremity supported Sitting balance-[name]: Good Sitting balance - Comments: improved from yesterday   Standing balance support: Bilateral upper extremity supported;During functional activity;Single extremity supported Standing balance-[name]: Fair Standing balance  comment: unfortuantely anxiolytic meds making patient more drowsy and confused, struggling a bit more with balance.                            Cognition Arousal/Alertness:  (intermittently drowsy) Behavior During Therapy: WFL  for tasks assessed/performed (somewhat distracted at times, tangential; mildly worse than baseline.) Overall Cognitive Status: History of cognitive impairments - at baseline                                 General Comments: somewhat sleeping before and after session, more difficulty following commands today compared to previous day. RN later reports a restless night and recent meds given for restlessness.      Exercises Other Exercises Other Exercises: 5xSTS c handheld assist from EOB    General Comments        Pertinent Vitals/Pain Pain Assessment: No/denies pain Faces Pain Scale: No hurt    Home Living                      Prior Function            PT Goals (current goals can now be found in the care plan section) Acute Rehab PT Goals Patient Stated Goal: avoid deconditioning whilst admitted; PT Goal Formulation: With family Time For Goal Achievement: 04/09/20 Potential to Achieve Goals: Good Progress towards PT goals: Progressing toward goals    Frequency    Min 2X/week      PT Plan Current plan remains appropriate    Co-evaluation              AM-PAC PT "6 Clicks" Mobility   Outcome Measure  Help needed turning from your back to your side while in a flat bed without using bedrails?: A Little Help needed moving from lying on your back to sitting on the side of a flat bed without using bedrails?: A Little Help needed moving to and from a bed to a chair (including a wheelchair)?: A Little Help needed standing up from a chair using your arms (e.g., wheelchair or bedside chair)?: A Little Help needed to walk in hospital room?: A Little Help needed climbing 3-5 steps with a railing? : A Little 6 Click Score: 18    End of Session Equipment Utilized During Treatment: Gait belt Activity Tolerance: Patient tolerated treatment well;No increased pain Patient left: in bed;with call bell/phone within reach;with bed alarm set Nurse  Communication: Mobility status (bed soiled) PT Visit Diagnosis: Unsteadiness on feet (R26.81);Difficulty in walking, not elsewhere classified (R26.2);Other abnormalities of gait and mobility (R26.89)     Time: 8546-2703 PT Time Calculation (min) (ACUTE ONLY): 39 min  Charges:  $Therapeutic Exercise: 38-52 mins                     11:03 AM, 03/27/20 Etta Grandchild, PT, DPT Physical Therapist - [facility]  [phone] ([place])     [place] C 03/27/2020, 10:54 AM

## 2020-03-27 NOTE — Discharge Summary (Signed)
Physician Discharge Summary  Roy Hernandez WGN:[id number] DOB: 06/21/1928 DOA: 03/22/2020  PCP: [name], MD  Admit date: 03/22/2020 Discharge date: 03/27/2020  Admitted From: Home Disposition: Home  Recommendations for Outpatient Follow-up:  1. Follow up with PCP in 1-2 weeks 2.   Home Health:No Equipment/Devices:None Discharge Condition: Stable CODE STATUS:Full Diet recommendation: Dysphagia   Brief/Interim Summary: 27yom PMH dementia, fully vaccinated, presented with shortness of breath. Admitted for acute hypoxic respiratory failure secondary to Covid pneumonia, with associated hypotension.  8/25: Patient seen and examined.  Incontinent of stool.  This appears to be baseline.  Unable to provide any reliable history.  No oxygen requirement.  Hemodynamically stable.  8/26: Patient seen and examined.  Stable time of discharge.  No oxygen requirement.  Hemodynamically stable.  Plan to dispo to skilled nursing facility.  Daughter, who is primary caregiver is unable to care for the patient at this time but should be able to within few days.  Anticipate short stay at skilled nursing facility.  Discharge Diagnoses:  Principal Problem:   Acute hypoxemic respiratory failure due to COVID-19 [facility]) Active Problems:   COVID-19   Pneumonia due to COVID-19 virus   Dementia ([place])   Thrombocytopenia ([place])  Acute hypoxemic respiratory failure due to COVID-19 [facility])  --03/22/20 20:36 O2 Sat 85% --8/21CXR Patchy areas of interstitial and airspace opacity on a background of pulmonary emphysema --Appears stable. --oxygen: RA --inflammatory markers  Ferritin 80 >162> 159 >207  CRP2.9 > 4.5> 6.3 > 7.9  Fibrin derivatives 743 >495> 393>312 --Tx  Remdesiver 8/22>8/26. Not a candidate for outpt infusion per Outpt Infusion team secondary to dementia, inability to sit still and reliably follow commands.  Completed remdesivir course in house  Steroids  8/22>9/1  baricitinim>not indicatedcurrently based on clinical status. Inflammatory markers mixed. Will continue to monitor. Discussed w/ daughter who consentedto use if needed  Completed remdesivir in house.  Steroids prescribed on discharge.  Patient is remained stable and can discharge to skilled nursing facility  Pneumonia due to COVID-19 virus --plan as above  Dementia ([place]) --appears stable;continue donepezil, memantine  Thrombocytopenia ([place]) --secondary to acute illness,resolving; no further evaluation  Discharge Instructions  Discharge Instructions    Diet - low sodium heart healthy   Complete by: As directed    Increase activity slowly   Complete by: As directed      Allergies as of 03/27/2020   No Known Allergies     Medication List    TAKE these medications   acetaminophen 500 MG tablet Commonly known as: TYLENOL Take 500 mg by mouth at bedtime.   alendronate 70 MG tablet Commonly known as: FOSAMAX TAKE 1 TABLET EVERY MONDAY. TAKE WITH A FULL GLASS OF WATER ON AN EMPTY STOMACH. What changed: See the new instructions.   ascorbic acid 500 MG tablet Commonly known as: VITAMIN C Take 1 tablet (500 mg total) by mouth daily.   aspirin EC 81 MG tablet Take 81 mg by mouth at bedtime.   cholecalciferol 25 MCG (1000 UNIT) tablet Commonly known as: VITAMIN D Take 1,000 Units by mouth daily.   clotrimazole-betamethasone cream Commonly known as: LOTRISONE   dexamethasone 6 MG tablet Commonly known as: Decadron Take 1 tablet (6 mg total) by mouth daily for 7 days.   donepezil 10 MG tablet Commonly known as: ARICEPT TAKE 1 TABLET AT BEDTIME   finasteride 5 MG tablet Commonly known as: PROSCAR Take 1 tablet (5 mg total) by mouth daily.   fluticasone 50 MCG/ACT nasal  spray Commonly known as: FLONASE USE 2 SPRAYS INTO BOTH NOSTRILS DAILY.   latanoprost 0.005 % ophthalmic solution Commonly known as: XALATAN Place 1 drop into both eyes at  bedtime.   loratadine 10 MG tablet Commonly known as: CLARITIN TAKE 1 TABLET EVERY DAY   LUBRICANT EYE DROPS PF OP Place 1 drop into both eyes 3 (three) times daily as needed (for dry eyes.).   memantine 5 MG tablet Commonly known as: Namenda Take 1 tablet (5 mg total) by mouth 2 (two) times daily.   mupirocin ointment 2 % Commonly known as: BACTROBAN Apply to ulceration every 3-4 days when changing Duoderm   MUSCLE RUB EX Apply 1 application topically 4 (four) times daily as needed (for pain.).   simvastatin 5 MG tablet Commonly known as: ZOCOR TAKE 1 TABLET AT BEDTIME   tamsulosin 0.4 MG Caps capsule Commonly known as: FLOMAX Take 2 capsules (0.8 mg total) by mouth daily.   timolol 0.5 % ophthalmic solution Commonly known as: TIMOPTIC Place 1 drop into both eyes daily.   vitamin B-12 1000 MCG tablet Commonly known as: CYANOCOBALAMIN Take 1,000 mcg by mouth daily.   zinc sulfate 220 (50 Zn) MG capsule Take 1 capsule (220 mg total) by mouth daily.       Contact information for after-discharge care    [facility] .   Service: Skilled Nursing Contact information: [place]                 No Known Allergies  Consultations:  None   Procedures/Studies: DG Chest Portable 1 View  Result Date: 03/22/2020 CLINICAL DATA:  Shortness of breath, positive COVID test. History of abnormal aorta. EXAM: PORTABLE CHEST 1 VIEW COMPARISON:  August 12, 2018 FINDINGS: Trachea is midline. Lung volumes are low. Accounting for this cardiomediastinal contours are stable and accentuated by AP technique. Patchy areas of interstitial and airspace opacity bilaterally. No dense consolidation. Distension of bowel loops in the upper abdomen as before. No sign of pleural effusion. On limited assessment visualized skeletal structures without acute process. IMPRESSION: 1. Patchy areas of  interstitial and airspace opacity on a background of pulmonary emphysema, findings may reflect atypical infection in this patient with reported history of COVID-19. 2. Distension of bowel loops in the upper abdomen as before. Electronically Signed   By: Zetta Bills M.D.   On: 03/22/2020 21:20    (Echo, Carotid, EGD, Colonoscopy, ERCP)    Subjective: Seen and examined on day of discharge.  Alert, responds to name, oriented to place.  Inquires about when he gets to go home.  Discharge Exam: Vitals:   03/27/20 0429 03/27/20 0809  BP: 108/69 102/75  Pulse: 80 (!) 101  Resp: 16 16  Temp: (!) 97.4 F (36.3 C) 97.8 F (36.6 C)  SpO2: (!) 89% 91%   Vitals:   03/27/20 0029 03/27/20 0103 03/27/20 0429 03/27/20 0809  BP: (!) 85/53 93/64 108/69 102/75  Pulse: 79 80 80 (!) 101  Resp: 18  16 16   Temp: 98.3 F (36.8 C)  (!) 97.4 F (36.3 C) 97.8 F (36.6 C)  TempSrc: Oral  Oral Oral  SpO2: 93% 95% (!) 89% 91%  Weight:      Height:        General: Pt is alert, awake, not in acute distress Cardiovascular: RRR, S1/S2 +, no rubs, no gallops Respiratory: CTA bilaterally, no wheezing, no rhonchi Abdominal: Soft, NT,  ND, bowel sounds + Extremities: no edema, no cyanosis    The results of significant diagnostics from this hospitalization (including imaging, microbiology, ancillary and laboratory) are listed below for reference.     Microbiology: Recent Results (from the past 240 hour(s))  Culture, blood (Routine x 2)     Status: None   Collection Time: 03/22/20  8:40 PM   Specimen: BLOOD  Result Value Ref Range Status   Specimen Description BLOOD BLOOD LEFT WRIST  Final   Special Requests   Final    BOTTLES DRAWN AEROBIC AND ANAEROBIC Blood Culture results may not be optimal due to an excessive volume of blood received in culture bottles   Culture   Final    NO GROWTH 5 DAYS Performed at [facility], [address] [place], [place] [address]    Report Status  03/27/2020 FINAL  Final  Culture, blood (Routine x 2)     Status: None   Collection Time: 03/22/20  8:50 PM   Specimen: BLOOD  Result Value Ref Range Status   Specimen Description BLOOD BLOOD RIGHT ARM  Final   Special Requests   Final    BOTTLES DRAWN AEROBIC AND ANAEROBIC Blood Culture adequate volume   Culture   Final    NO GROWTH 5 DAYS Performed at [facility], [address] [place], [place] [address]    Report Status 03/27/2020 FINAL  Final  SARS Coronavirus 2 by RT PCR (hospital order, performed in [place] hospital lab) Nasopharyngeal Nasopharyngeal Swab     Status: Abnormal   Collection Time: 03/22/20  8:51 PM   Specimen: Nasopharyngeal Swab  Result Value Ref Range Status   SARS Coronavirus 2 POSITIVE (A) NEGATIVE Final    Comment: RESULT CALLED TO, READ BACK BY AND VERIFIED WITH: L FERGUSON 03/22/20 AT 2232 MJU (NOTE) SARS-CoV-2 target nucleic acids are DETECTED  SARS-CoV-2 RNA is generally detectable in upper respiratory specimens  during the acute phase of infection.  Positive results are indicative  of the presence of the identified virus, but do not rule out bacterial infection or co-infection with other pathogens not detected by the test.  Clinical correlation with patient history and  other diagnostic information is necessary to determine patient infection status.  The expected result is negative.  Fact Sheet for Patients:   StrictlyIdeas.no   Fact Sheet for Healthcare Providers:   BankingDealers.co.za    This test is not yet approved or cleared by the [place] FDA and  has been authorized for detection and/or diagnosis of SARS-CoV-2 by FDA under an Emergency Use Authorization (EUA).  This EUA will remain in effect (meaning this test c an be used) for the duration of  the COVID-19 declaration under Section 564(b)(1) of the Act, 21 U.S.C. section 360-bbb-3(b)(1), unless the authorization  is terminated or revoked sooner.  Performed at [facility], [address] [place], [place] [address]      Labs: BNP (last 3 results) No results for input(s): BNP in the last 8760 hours. Basic Metabolic Panel: Recent Labs  Lab 03/22/20 2040 03/23/20 0629 03/24/20 0444 03/25/20 0514 03/26/20 0621 03/27/20 0617  NA 131* 136  --   --  136 136  K 3.7 3.8  --   --  4.0 4.0  CL 94* 100  --   --  96* 97*  CO2 27 26  --   --  29 29  GLUCOSE 139* 127*  --   --  151* 160*  BUN 23 17  --   --  28* 30*  CREATININE 0.92 0.88  --   --  0.70 0.85  CALCIUM 8.8* 8.7*  --   --  8.8* 8.9  MG  --  1.7 1.8 1.6* 2.2 2.3  PHOS  --  2.9 2.9 4.6 3.1 3.3   Liver Function Tests: Recent Labs  Lab 03/22/20 2040 03/26/20 0621 03/27/20 0617  AST 15 22 23   ALT 10 12 15   ALKPHOS 78 61 62  BILITOT 0.5 1.0 1.1  PROT 6.5 6.2* 6.4*  ALBUMIN 3.5 2.9* 3.1*   No results for input(s): LIPASE, AMYLASE in the last 168 hours. No results for input(s): AMMONIA in the last 168 hours. CBC: Recent Labs  Lab 03/23/20 0629 03/24/20 0444 03/25/20 0514 03/26/20 0621 03/27/20 0617  WBC 5.9 4.8 8.4 6.4 6.1  NEUTROABS 4.7 3.9 7.2 5.5 5.2  HGB 11.5* 12.8* 12.6* 13.7 14.3  HCT 34.2* 38.1* 37.5* 39.0 41.0  MCV 95.5 94.3 93.5 91.1 91.3  PLT 102* 114* 143* 146* 164   Cardiac Enzymes: No results for input(s): CKTOTAL, CKMB, CKMBINDEX, TROPONINI in the last 168 hours. BNP: Invalid input(s): POCBNP CBG: Recent Labs  Lab 03/26/20 0834 03/26/20 1207 03/26/20 1646 03/26/20 2030 03/27/20 0807  GLUCAP 158* 131* 122* 114* 162*   D-Dimer No results for input(s): DDIMER in the last 72 hours. Hgb A1c No results for input(s): HGBA1C in the last 72 hours. Lipid Profile No results for input(s): CHOL, HDL, LDLCALC, TRIG, CHOLHDL, LDLDIRECT in the last 72 hours. Thyroid function studies No results for input(s): TSH, T4TOTAL, T3FREE, THYROIDAB in the last 72 hours.  Invalid input(s): FREET3 Anemia  work up Recent Labs    03/26/20 0621 03/27/20 0617  FERRITIN 207 208   Urinalysis    Component Value Date/Time   COLORURINE YELLOW (A) 03/23/2020 0114   APPEARANCEUR CLEAR (A) 03/23/2020 0114   APPEARANCEUR Clear 02/13/2020 1311   LABSPEC 1.016 03/23/2020 0114   PHURINE 5.0 03/23/2020 0114   GLUCOSEU NEGATIVE 03/23/2020 0114   HGBUR MODERATE (A) 03/23/2020 0114   BILIRUBINUR NEGATIVE 03/23/2020 0114   BILIRUBINUR Negative 02/13/2020 1311   KETONESUR NEGATIVE 03/23/2020 0114   PROTEINUR NEGATIVE 03/23/2020 0114   UROBILINOGEN 0.2 09/01/2018 1357   NITRITE NEGATIVE 03/23/2020 0114   LEUKOCYTESUR NEGATIVE 03/23/2020 0114   Sepsis Labs Invalid input(s): PROCALCITONIN,  WBC,  LACTICIDVEN Microbiology Recent Results (from the past 240 hour(s))  Culture, blood (Routine x 2)     Status: None   Collection Time: 03/22/20  8:40 PM   Specimen: BLOOD  Result Value Ref Range Status   Specimen Description BLOOD BLOOD LEFT WRIST  Final   Special Requests   Final    BOTTLES DRAWN AEROBIC AND ANAEROBIC Blood Culture results may not be optimal due to an excessive volume of blood received in culture bottles   Culture   Final    NO GROWTH 5 DAYS Performed at [facility], [address] [place], [place] [address]    Report Status 03/27/2020 FINAL  Final  Culture, blood (Routine x 2)     Status: None   Collection Time: 03/22/20  8:50 PM   Specimen: BLOOD  Result Value Ref Range Status   Specimen Description BLOOD BLOOD RIGHT ARM  Final   Special Requests   Final    BOTTLES DRAWN AEROBIC AND ANAEROBIC Blood Culture adequate volume   Culture   Final    NO GROWTH 5 DAYS Performed at [facility]  Lab, [address] [place], [place] [address]    Report Status 03/27/2020 FINAL  Final  SARS Coronavirus 2 by RT PCR (hospital order, performed in [facility] hospital lab) Nasopharyngeal Nasopharyngeal Swab     Status: Abnormal   Collection Time: 03/22/20  8:51 PM   Specimen:  Nasopharyngeal Swab  Result Value Ref Range Status   SARS Coronavirus 2 POSITIVE (A) NEGATIVE Final    Comment: RESULT CALLED TO, READ BACK BY AND VERIFIED WITH: L FERGUSON 03/22/20 AT 2232 MJU (NOTE) SARS-CoV-2 target nucleic acids are DETECTED  SARS-CoV-2 RNA is generally detectable in upper respiratory specimens  during the acute phase of infection.  Positive results are indicative  of the presence of the identified virus, but do not rule out bacterial infection or co-infection with other pathogens not detected by the test.  Clinical correlation with patient history and  other diagnostic information is necessary to determine patient infection status.  The expected result is negative.  Fact Sheet for Patients:   StrictlyIdeas.no   Fact Sheet for Healthcare Providers:   BankingDealers.co.za    This test is not yet approved or cleared by the [place] FDA and  has been authorized for detection and/or diagnosis of SARS-CoV-2 by FDA under an Emergency Use Authorization (EUA).  This EUA will remain in effect (meaning this test c an be used) for the duration of  the COVID-19 declaration under Section 564(b)(1) of the Act, 21 U.S.C. section 360-bbb-3(b)(1), unless the authorization is terminated or revoked sooner.  Performed at [facility], [address], [place], [place] [address]      Time coordinating discharge: Over 30 minutes  SIGNED:   Sidney Ace, MD  [facility] 03/27/2020, 12:01 PM Pager   If 7PM-7AM, please contact night-coverage

## 2020-03-28 DIAGNOSIS — D692 Other nonthrombocytopenic purpura: Secondary | ICD-10-CM | POA: Diagnosis not present

## 2020-03-28 DIAGNOSIS — R5381 Other malaise: Secondary | ICD-10-CM | POA: Diagnosis not present

## 2020-03-28 DIAGNOSIS — U071 COVID-19: Secondary | ICD-10-CM | POA: Diagnosis not present

## 2020-03-28 DIAGNOSIS — F0390 Unspecified dementia without behavioral disturbance: Secondary | ICD-10-CM | POA: Diagnosis not present

## 2020-03-29 ENCOUNTER — Other Ambulatory Visit: Payer: Self-pay

## 2020-03-29 ENCOUNTER — Encounter: Payer: Self-pay | Admitting: Emergency Medicine

## 2020-03-29 ENCOUNTER — Emergency Department: Payer: Medicare HMO

## 2020-03-29 ENCOUNTER — Encounter: Payer: Self-pay | Admitting: Family Medicine

## 2020-03-29 ENCOUNTER — Inpatient Hospital Stay
Admission: EM | Admit: 2020-03-29 | Discharge: 2020-04-02 | DRG: 208 | Disposition: E | Payer: Medicare HMO | Source: Skilled Nursing Facility | Attending: Pulmonary Disease | Admitting: Pulmonary Disease

## 2020-03-29 DIAGNOSIS — Z85828 Personal history of other malignant neoplasm of skin: Secondary | ICD-10-CM | POA: Diagnosis not present

## 2020-03-29 DIAGNOSIS — H409 Unspecified glaucoma: Secondary | ICD-10-CM | POA: Diagnosis not present

## 2020-03-29 DIAGNOSIS — I447 Left bundle-branch block, unspecified: Secondary | ICD-10-CM | POA: Diagnosis present

## 2020-03-29 DIAGNOSIS — J9601 Acute respiratory failure with hypoxia: Secondary | ICD-10-CM | POA: Diagnosis present

## 2020-03-29 DIAGNOSIS — Z806 Family history of leukemia: Secondary | ICD-10-CM

## 2020-03-29 DIAGNOSIS — M810 Age-related osteoporosis without current pathological fracture: Secondary | ICD-10-CM | POA: Diagnosis present

## 2020-03-29 DIAGNOSIS — I714 Abdominal aortic aneurysm, without rupture: Secondary | ICD-10-CM | POA: Diagnosis present

## 2020-03-29 DIAGNOSIS — Z86006 Personal history of melanoma in-situ: Secondary | ICD-10-CM | POA: Diagnosis not present

## 2020-03-29 DIAGNOSIS — R000 Tachycardia, unspecified: Secondary | ICD-10-CM

## 2020-03-29 DIAGNOSIS — Z833 Family history of diabetes mellitus: Secondary | ICD-10-CM | POA: Diagnosis not present

## 2020-03-29 DIAGNOSIS — G9389 Other specified disorders of brain: Secondary | ICD-10-CM | POA: Diagnosis not present

## 2020-03-29 DIAGNOSIS — N179 Acute kidney failure, unspecified: Secondary | ICD-10-CM | POA: Diagnosis present

## 2020-03-29 DIAGNOSIS — U071 COVID-19: Secondary | ICD-10-CM | POA: Diagnosis not present

## 2020-03-29 DIAGNOSIS — Z83438 Family history of other disorder of lipoprotein metabolism and other lipidemia: Secondary | ICD-10-CM | POA: Diagnosis not present

## 2020-03-29 DIAGNOSIS — R4189 Other symptoms and signs involving cognitive functions and awareness: Secondary | ICD-10-CM

## 2020-03-29 DIAGNOSIS — Z7982 Long term (current) use of aspirin: Secondary | ICD-10-CM

## 2020-03-29 DIAGNOSIS — M1990 Unspecified osteoarthritis, unspecified site: Secondary | ICD-10-CM | POA: Diagnosis present

## 2020-03-29 DIAGNOSIS — I4891 Unspecified atrial fibrillation: Secondary | ICD-10-CM | POA: Diagnosis not present

## 2020-03-29 DIAGNOSIS — Z79899 Other long term (current) drug therapy: Secondary | ICD-10-CM

## 2020-03-29 DIAGNOSIS — Z87891 Personal history of nicotine dependence: Secondary | ICD-10-CM

## 2020-03-29 DIAGNOSIS — I959 Hypotension, unspecified: Secondary | ICD-10-CM | POA: Diagnosis not present

## 2020-03-29 DIAGNOSIS — E1151 Type 2 diabetes mellitus with diabetic peripheral angiopathy without gangrene: Secondary | ICD-10-CM | POA: Diagnosis present

## 2020-03-29 DIAGNOSIS — E872 Acidosis, unspecified: Secondary | ICD-10-CM

## 2020-03-29 DIAGNOSIS — R4020 Unspecified coma: Secondary | ICD-10-CM | POA: Diagnosis not present

## 2020-03-29 DIAGNOSIS — R4182 Altered mental status, unspecified: Secondary | ICD-10-CM | POA: Diagnosis not present

## 2020-03-29 DIAGNOSIS — Z841 Family history of disorders of kidney and ureter: Secondary | ICD-10-CM

## 2020-03-29 DIAGNOSIS — E785 Hyperlipidemia, unspecified: Secondary | ICD-10-CM | POA: Diagnosis present

## 2020-03-29 DIAGNOSIS — R404 Transient alteration of awareness: Secondary | ICD-10-CM | POA: Diagnosis not present

## 2020-03-29 DIAGNOSIS — Z8249 Family history of ischemic heart disease and other diseases of the circulatory system: Secondary | ICD-10-CM | POA: Diagnosis not present

## 2020-03-29 DIAGNOSIS — Z515 Encounter for palliative care: Secondary | ICD-10-CM | POA: Diagnosis present

## 2020-03-29 DIAGNOSIS — F0390 Unspecified dementia without behavioral disturbance: Secondary | ICD-10-CM | POA: Diagnosis present

## 2020-03-29 DIAGNOSIS — I10 Essential (primary) hypertension: Secondary | ICD-10-CM | POA: Diagnosis present

## 2020-03-29 DIAGNOSIS — Z7983 Long term (current) use of bisphosphonates: Secondary | ICD-10-CM

## 2020-03-29 DIAGNOSIS — Z66 Do not resuscitate: Secondary | ICD-10-CM | POA: Diagnosis present

## 2020-03-29 DIAGNOSIS — I7090 Unspecified atherosclerosis: Secondary | ICD-10-CM | POA: Diagnosis not present

## 2020-03-29 DIAGNOSIS — J189 Pneumonia, unspecified organism: Secondary | ICD-10-CM | POA: Diagnosis not present

## 2020-03-29 DIAGNOSIS — R52 Pain, unspecified: Secondary | ICD-10-CM | POA: Diagnosis not present

## 2020-03-29 DIAGNOSIS — J1282 Pneumonia due to coronavirus disease 2019: Secondary | ICD-10-CM | POA: Diagnosis not present

## 2020-03-29 LAB — COMPREHENSIVE METABOLIC PANEL
ALT: 15 U/L (ref 0–44)
AST: 43 U/L — ABNORMAL HIGH (ref 15–41)
Albumin: 2.2 g/dL — ABNORMAL LOW (ref 3.5–5.0)
Alkaline Phosphatase: 42 U/L (ref 38–126)
Anion gap: 11 (ref 5–15)
BUN: 46 mg/dL — ABNORMAL HIGH (ref 8–23)
CO2: 25 mmol/L (ref 22–32)
Calcium: 8.1 mg/dL — ABNORMAL LOW (ref 8.9–10.3)
Chloride: 104 mmol/L (ref 98–111)
Creatinine, Ser: 2.4 mg/dL — ABNORMAL HIGH (ref 0.61–1.24)
GFR calc Af Amer: 27 mL/min — ABNORMAL LOW (ref >60)
GFR calc non Af Amer: 23 mL/min — ABNORMAL LOW (ref >60)
Glucose, Bld: 105 mg/dL — ABNORMAL HIGH (ref 70–99)
Potassium: 4.2 mmol/L (ref 3.5–5.1)
Sodium: 140 mmol/L (ref 135–145)
Total Bilirubin: 1.4 mg/dL — ABNORMAL HIGH (ref 0.3–1.2)
Total Protein: 5.1 g/dL — ABNORMAL LOW (ref 6.5–8.1)

## 2020-03-29 LAB — BASIC METABOLIC PANEL
Anion gap: 12 (ref 5–15)
BUN: 47 mg/dL — ABNORMAL HIGH (ref 8–23)
CO2: 27 mmol/L (ref 22–32)
Calcium: 8.3 mg/dL — ABNORMAL LOW (ref 8.9–10.3)
Chloride: 100 mmol/L (ref 98–111)
Creatinine, Ser: 2.35 mg/dL — ABNORMAL HIGH (ref 0.61–1.24)
GFR calc Af Amer: 27 mL/min — ABNORMAL LOW (ref >60)
GFR calc non Af Amer: 23 mL/min — ABNORMAL LOW (ref >60)
Glucose, Bld: 93 mg/dL (ref 70–99)
Potassium: 4.6 mmol/L (ref 3.5–5.1)
Sodium: 139 mmol/L (ref 135–145)

## 2020-03-29 LAB — CBC WITH DIFFERENTIAL/PLATELET
Abs Immature Granulocytes: 0.13 10*3/uL — ABNORMAL HIGH (ref 0.00–0.07)
Basophils Absolute: 0.1 10*3/uL (ref 0.0–0.1)
Basophils Relative: 1 %
Eosinophils Absolute: 0 10*3/uL (ref 0.0–0.5)
Eosinophils Relative: 0 %
HCT: 38 % — ABNORMAL LOW (ref 39.0–52.0)
Hemoglobin: 12.8 g/dL — ABNORMAL LOW (ref 13.0–17.0)
Immature Granulocytes: 2 %
Lymphocytes Relative: 8 %
Lymphs Abs: 0.5 10*3/uL — ABNORMAL LOW (ref 0.7–4.0)
MCH: 32 pg (ref 26.0–34.0)
MCHC: 33.7 g/dL (ref 30.0–36.0)
MCV: 95 fL (ref 80.0–100.0)
Monocytes Absolute: 0.2 10*3/uL (ref 0.1–1.0)
Monocytes Relative: 3 %
Neutro Abs: 5 10*3/uL (ref 1.7–7.7)
Neutrophils Relative %: 86 %
Platelets: 179 10*3/uL (ref 150–400)
RBC: 4 MIL/uL — ABNORMAL LOW (ref 4.22–5.81)
RDW: 13.2 % (ref 11.5–15.5)
Smear Review: NORMAL
WBC Morphology: INCREASED
WBC: 5.8 10*3/uL (ref 4.0–10.5)
nRBC: 0 % (ref 0.0–0.2)

## 2020-03-29 LAB — BLOOD GAS, ARTERIAL
Acid-base deficit: 2.2 mmol/L — ABNORMAL HIGH (ref 0.0–2.0)
Bicarbonate: 25.6 mmol/L (ref 20.0–28.0)
FIO2: 1
MECHVT: 450 mL
O2 Saturation: 85 %
PEEP: 5 cmH2O
Patient temperature: 37
RATE: 18 resp/min
pCO2 arterial: 57 mmHg — ABNORMAL HIGH (ref 32.0–48.0)
pH, Arterial: 7.26 — ABNORMAL LOW (ref 7.350–7.450)
pO2, Arterial: 58 mmHg — ABNORMAL LOW (ref 83.0–108.0)

## 2020-03-29 LAB — CBC
HCT: 40.4 % (ref 39.0–52.0)
Hemoglobin: 13 g/dL (ref 13.0–17.0)
MCH: 31.4 pg (ref 26.0–34.0)
MCHC: 32.2 g/dL (ref 30.0–36.0)
MCV: 97.6 fL (ref 80.0–100.0)
Platelets: 185 10*3/uL (ref 150–400)
RBC: 4.14 MIL/uL — ABNORMAL LOW (ref 4.22–5.81)
RDW: 13.3 % (ref 11.5–15.5)
WBC: 6.5 10*3/uL (ref 4.0–10.5)
nRBC: 0 % (ref 0.0–0.2)

## 2020-03-29 LAB — TROPONIN I (HIGH SENSITIVITY)
Troponin I (High Sensitivity): 271 ng/L (ref <18)
Troponin I (High Sensitivity): 240 ng/L (ref <18)

## 2020-03-29 LAB — MAGNESIUM: Magnesium: 1.8 mg/dL (ref 1.7–2.4)

## 2020-03-29 LAB — LACTIC ACID, PLASMA: Lactic Acid, Venous: 5.5 mmol/L (ref 0.5–1.9)

## 2020-03-29 MED ORDER — POLYETHYLENE GLYCOL 3350 17 G PO PACK: 17.0000 g | PACK | Freq: Every day | ORAL | Status: DC | PRN

## 2020-03-29 MED ORDER — MORPHINE 100MG IN NS 100ML (1MG/ML) PREMIX INFUSION
1.0000 mg/h | INTRAVENOUS | Status: DC
  Filled 2020-03-29: qty 100

## 2020-03-29 MED ORDER — DOCUSATE SODIUM 100 MG PO CAPS: 100.0000 mg | ORAL_CAPSULE | Freq: Two times a day (BID) | ORAL | Status: DC | PRN

## 2020-03-29 MED ORDER — NOREPINEPHRINE 4 MG/250ML-% IV SOLN
0.0000 ug/min | INTRAVENOUS | Status: DC
  Administered 2020-03-29: 2 ug/min via INTRAVENOUS
  Filled 2020-03-29: qty 250

## 2020-03-29 MED ORDER — ENOXAPARIN SODIUM 30 MG/0.3ML ~~LOC~~ SOLN
30.0000 mg | SUBCUTANEOUS | Status: DC
  Filled 2020-03-29: qty 0.3

## 2020-03-29 MED ORDER — FENTANYL 2500MCG IN NS 250ML (10MCG/ML) PREMIX INFUSION
50.0000 ug/h | INTRAVENOUS | Status: DC
  Administered 2020-03-29: 50 ug/h via INTRAVENOUS
  Filled 2020-03-29: qty 250

## 2020-03-29 MED ORDER — LACTATED RINGERS IV BOLUS
1000.0000 mL | Freq: Once | INTRAVENOUS | Status: AC
  Administered 2020-03-29: 1000 mL via INTRAVENOUS

## 2020-03-29 MED ORDER — ASPIRIN 81 MG PO CHEW: 324.0000 mg | CHEWABLE_TABLET | ORAL | Status: DC

## 2020-03-29 MED ORDER — DEXAMETHASONE SODIUM PHOSPHATE 10 MG/ML IJ SOLN
6.0000 mg | Freq: Once | INTRAMUSCULAR | Status: AC
  Administered 2020-03-29: 6 mg via INTRAVENOUS
  Filled 2020-03-29: qty 1

## 2020-03-29 MED ORDER — ASPIRIN 300 MG RE SUPP: 300.0000 mg | RECTAL | Status: DC

## 2020-04-02 NOTE — ED Notes (Signed)
Spoke with Dr. Lanney Gins regarding pt converting to sustained VTACH and BP dropping c levo increased to 57mics. Made aware by MD that pt is to be a DNR, per his daughter, if he were to deteriorate. Called daughter per MD's request to verify. Called daughter, [name], to educate of changes in status of pt. Daughter states that her wishes are to "not push it. If his heart stops, DO NOT resuscitate. He wouldn't want it". Verified pt's daughter wishes with second RN, Anda Kraft. Dr. Loni Muse to bedside, made aware to make pt comfort. Per daughter's wishes, stopped levo gtt and called RT to extubate pt to comfort. Family to be kept updated.

## 2020-04-02 NOTE — ED Notes (Addendum)
RT extubated pt, OG tube removed with ET tube.

## 2020-04-02 NOTE — ED Notes (Signed)
TOD called at 16:24. Rhythm strip printed. Verified by this RN and Musician. No pulses present. No audible heart sounds heard.

## 2020-04-02 NOTE — Death Summary Note (Signed)
CRITICAL CARE PROGRESS NOTE    Name: Roy Hernandez MRN: [id number] DOB: 04-11-1928     LOS: 0   SUBJECTIVE FINDINGS & SIGNIFICANT EVENTS    Patient description:  86 yo M with COVID19 induced acute hypoxemic respiratory failure.  He was discharged from [facility] 03/27/20 post acute COVID.  He was at [facility] noted to be poorly response was brought in by EMS and intubated in the field.   I spoke to daughter [name], she had just lost her sister to [place] and was very sad however understands patient is deteriorating. We discussed goals of care, his son is on the way to see him and plan is for comfort measures after son comes in to see patient. At this time daughter does not wish for CPR and code status is DNR.    Daughter [name] was speaking to [place] during this admission.  Patient further deteriorated and she advanced code status to comfort measures.  Patient was extubated and made comfortable. Time of death was 44.  Lines/tubes : Airway 7 mm (Active)  Secured at (cm) 25 cm 04-09-2020 1010  Measured From Lips 2020/04/09 1010  Secured Location Right Apr 09, 2020 1010  Secured By [employer] April 09, 2020 1010  Site Condition Dry April 09, 2020 0955     NG/OG Tube Orogastric 16 Fr. Right mouth Xray (Active)  Cm Marking at Nare/Corner of Mouth (if applicable) 55 cm 56/81/27 1029  Site Assessment Clean;Dry;Intact 04/09/20 1029     Urethral Catheter [name], RN Temperature probe 16 Fr. (Active)  Indication for Insertion or Continuance of Catheter Unstable critically ill patients first 24-48 hours (See Criteria) 2020-04-09 1044  Site Assessment Clean;Intact;Dry 04-09-20 1044  Catheter Maintenance Bag below level of bladder;Catheter secured;Drainage bag/tubing not touching floor;Insertion date on drainage bag;No dependent  loops;Seal intact 2020/04/09 1044  Collection Container Standard drainage bag 04/09/2020 1044  Securement Method Securing device (Describe) 04/09/20 1044    Microbiology/Sepsis markers: Results for orders placed or performed during the hospital encounter of 03/22/20  Culture, blood (Routine x 2)     Status: None   Collection Time: 03/22/20  8:40 PM   Specimen: BLOOD  Result Value Ref Range Status   Specimen Description BLOOD BLOOD LEFT WRIST  Final   Special Requests   Final    BOTTLES DRAWN AEROBIC AND ANAEROBIC Blood Culture results may not be optimal due to an excessive volume of blood received in culture bottles   Culture   Final    NO GROWTH 5 DAYS Performed at [facility], [address] [place], [place] [address]    Report Status 03/27/2020 FINAL  Final  Culture, blood (Routine x 2)     Status: None   Collection Time: 03/22/20  8:50 PM   Specimen: BLOOD  Result Value Ref Range Status   Specimen Description BLOOD BLOOD RIGHT ARM  Final   Special Requests   Final    BOTTLES DRAWN AEROBIC AND ANAEROBIC Blood Culture adequate volume   Culture   Final    NO GROWTH 5 DAYS Performed at [facility], [address], [place], [place] [address]    Report Status 03/27/2020 FINAL  Final  SARS Coronavirus 2 by RT PCR (hospital order, performed in [facility] hospital lab) Nasopharyngeal Nasopharyngeal Swab     Status: Abnormal   Collection Time: 03/22/20  8:51 PM   Specimen: Nasopharyngeal Swab  Result Value Ref Range Status   SARS Coronavirus 2 POSITIVE (A) NEGATIVE Final  Comment: RESULT CALLED TO, READ BACK BY AND VERIFIED WITH: L FERGUSON 03/22/20 AT 2232 MJU (NOTE) SARS-CoV-2 target nucleic acids are DETECTED  SARS-CoV-2 RNA is generally detectable in upper respiratory specimens  during the acute phase of infection.  Positive results are indicative  of the presence of the identified virus, but do not rule out bacterial infection or co-infection with other  pathogens not detected by the test.  Clinical correlation with patient history and  other diagnostic information is necessary to determine patient infection status.  The expected result is negative.  Fact Sheet for Patients:   StrictlyIdeas.no   Fact Sheet for Healthcare Providers:   BankingDealers.co.za    This test is not yet approved or cleared by the [place] FDA and  has been authorized for detection and/or diagnosis of SARS-CoV-2 by FDA under an Emergency Use Authorization (EUA).  This EUA will remain in effect (meaning this test c an be used) for the duration of  the COVID-19 declaration under Section 564(b)(1) of the Act, 21 U.S.C. section 360-bbb-3(b)(1), unless the authorization is terminated or revoked sooner.  Performed at [facility], [address] [place], [place] [address]     Anti-infectives:  Anti-infectives (From admission, onward)   None      PAST MEDICAL HISTORY   Past Medical History:  Diagnosis Date  . AAA (abdominal aortic aneurysm) ([place])   . Arthritis   . Basal cell carcinoma 06/18/2013   L spinal upper back  . Cataract   . Dementia ([place])   . Diabetes mellitus without complication (HCC)    diet controlled  . Glaucoma   . Hyperkalemia   . Hyperlipidemia   . Hypertension   . Inguinal hernia of right side without obstruction or gangrene   . Melanoma in situ ([place]) 07/05/2016   L post shoulder/excision  . Osteoporosis   . Peripheral vascular disease ([place])   . Vitamin D deficiency      SURGICAL HISTORY   Past Surgical History:  Procedure Laterality Date  . CATARACT EXTRACTION Bilateral   . HERNIA REPAIR  June 2019  . INGUINAL HERNIA REPAIR Right 01/27/2018   Medium Ultra Pro mesh.  Surgeon: Robert Bellow, MD;  Location: [facility] ORS;  Service: General;  Laterality: Right;  with mesh  . TOOTH EXTRACTION       FAMILY HISTORY   Family History  Problem Relation Age of  Onset  . Cancer Mother   . Hypertension Mother   . Diabetes Father   . Hyperlipidemia Father   . Diabetes Maternal Grandmother   . Diabetes Brother   . Kidney disease Brother        on dialysis  . Diabetes Sister   . Kidney disease Sister        dialysis  . Cancer Brother        esophageal  . Leukemia Sister      SOCIAL HISTORY   Social History   Tobacco Use  . Smoking status: Former Smoker    Packs/day: 1.00    Years: 34.00    Pack years: 34.00    Types: Cigarettes    Start date: 08/02/1945    Quit date: 05/26/1980    Years since quitting: 39.8  . Smokeless tobacco: Never Used  . Tobacco comment: smoking cessation materials not required  Vaping Use  . Vaping Use: Never used  Substance Use Topics  . Alcohol use: No    Alcohol/week: 0.0 standard drinks  . Drug use: No  MEDICATIONS   Current Medication:  Current Facility-Administered Medications:  .  aspirin chewable tablet 324 mg, 324 mg, Oral, NOW **OR** aspirin suppository 300 mg, 300 mg, Rectal, NOW, Lanney Gins, Damonte Frieson, MD .  docusate sodium (COLACE) capsule 100 mg, 100 mg, Oral, BID PRN, Lanney Gins, Annsleigh Dragoo, MD .  enoxaparin (LOVENOX) injection 30 mg, 30 mg, Subcutaneous, Q24H, Rosalin Buster, MD .  fentaNYL 2536mcg in NS 244mL (61mcg/ml) infusion-PREMIX, 50 mcg/hr, Intravenous, Continuous, Vladimir Crofts, MD, Last Rate: 15 mL/hr at 04/21/2020 1535, 150 mcg/hr at 04-21-20 1535 .  morphine 100mg  in NS 185mL (1mg /mL) infusion - premix, 1-10 mg/hr, Intravenous, Continuous, Kaydon Creedon, MD .  norepinephrine (LEVOPHED) 4mg  in 263mL premix infusion, 0-40 mcg/min, Intravenous, Continuous, Vladimir Crofts, MD, Last Rate: 37.5 mL/hr at 21-Apr-2020 1535, 10 mcg/min at 04-21-20 1535 .  polyethylene glycol (MIRALAX / GLYCOLAX) packet 17 g, 17 g, Oral, Daily PRN, Ottie Glazier, MD  Current Outpatient Medications:  .  acetaminophen (TYLENOL) 500 MG tablet, Take 500 mg by mouth at bedtime., Disp: , Rfl:  .  alendronate (FOSAMAX)  70 MG tablet, TAKE 1 TABLET EVERY MONDAY. TAKE WITH A FULL GLASS OF WATER ON AN EMPTY STOMACH. (Patient taking differently: Take 140 mg by mouth daily. ), Disp: 12 tablet, Rfl: 3 .  ascorbic acid (VITAMIN C) 500 MG tablet, Take 1 tablet (500 mg total) by mouth daily., Disp: , Rfl:  .  aspirin EC 81 MG tablet, Take 81 mg by mouth at bedtime., Disp: , Rfl:  .  Carboxymethylcellulose Sodium (LUBRICANT EYE DROPS PF OP), Place 1 drop into both eyes 3 (three) times daily as needed (for dry eyes.). , Disp: , Rfl:  .  cholecalciferol (VITAMIN D) 1000 UNITS tablet, Take 1,000 Units by mouth daily. , Disp: , Rfl:  .  clotrimazole-betamethasone (LOTRISONE) cream, , Disp: , Rfl:  .  dexamethasone (DECADRON) 6 MG tablet, Take 1 tablet (6 mg total) by mouth daily for 7 days., Disp: 7 tablet, Rfl: 0 .  donepezil (ARICEPT) 10 MG tablet, TAKE 1 TABLET AT BEDTIME (Patient taking differently: Take 10 mg by mouth at bedtime. ), Disp: 90 tablet, Rfl: 1 .  finasteride (PROSCAR) 5 MG tablet, Take 1 tablet (5 mg total) by mouth daily., Disp: 90 tablet, Rfl: 3 .  fluticasone (FLONASE) 50 MCG/ACT nasal spray, USE 2 SPRAYS INTO BOTH NOSTRILS DAILY., Disp: 48 g, Rfl: 1 .  latanoprost (XALATAN) 0.005 % ophthalmic solution, Place 1 drop into both eyes at bedtime. , Disp: , Rfl:  .  loratadine (CLARITIN) 10 MG tablet, TAKE 1 TABLET EVERY DAY (Patient taking differently: Take 10 mg by mouth daily. ), Disp: 90 tablet, Rfl: 0 .  memantine (NAMENDA) 5 MG tablet, Take 1 tablet (5 mg total) by mouth 2 (two) times daily., Disp: 180 tablet, Rfl: 1 .  Menthol-Methyl Salicylate (MUSCLE RUB EX), Apply 1 application topically 4 (four) times daily as needed (for pain.). , Disp: , Rfl:  .  mupirocin ointment (BACTROBAN) 2 %, Apply to ulceration every 3-4 days when changing Duoderm, Disp: 22 g, Rfl: 0 .  simvastatin (ZOCOR) 5 MG tablet, TAKE 1 TABLET AT BEDTIME (Patient taking differently: Take 5 mg by mouth at bedtime. ), Disp: 90 tablet, Rfl:  1 .  tamsulosin (FLOMAX) 0.4 MG CAPS capsule, Take 2 capsules (0.8 mg total) by mouth daily., Disp: 90 capsule, Rfl: 3 .  timolol (TIMOPTIC) 0.5 % ophthalmic solution, Place 1 drop into both eyes daily. , Disp: , Rfl:  .  vitamin B-12 (  CYANOCOBALAMIN) 1000 MCG tablet, Take 1,000 mcg by mouth daily. , Disp: , Rfl:  .  zinc sulfate 220 (50 Zn) MG capsule, Take 1 capsule (220 mg total) by mouth daily., Disp: , Rfl:     ALLERGIES   Patient has no known allergies.    REVIEW OF SYSTEMS    Unable to obtain due to MV and comatose state  PHYSICAL EXAMINATION   Vital Signs: Temp:  [97.2 F (36.2 C)-99.6 F (37.6 C)] 99.6 F (37.6 C) (08/28 1615) Pulse Rate:  [30-135] 49 (08/28 1615) Resp:  [14-26] 14 (08/28 1615) BP: (68-116)/(38-77) 83/68 (08/28 1545) SpO2:  [66 %-95 %] 74 % (08/28 1615) FiO2 (%):  [100 %] 100 % (08/28 0955) Weight:  [73.7 kg] 73.7 kg (08/28 1000)  GENERAL:Age appropriate on MV HEAD: Normocephalic, atraumatic.  EYES: Pupils equal, round, reactive to light.  No scleral icterus.  MOUTH: Moist mucosal membrane. NECK: Supple. No thyromegaly. No nodules. No JVD.  PULMONARY: rhonchi bialterally  CARDIOVASCULAR: S1 and S2. Regular rate and rhythm. No murmurs, rubs, or gallops.  GASTROINTESTINAL: Soft, nontender, non-distended. No masses. Positive bowel sounds. No hepatosplenomegaly.  MUSCULOSKELETAL: No swelling, clubbing, or edema.  NEUROLOGIC: Mild distress due to acute illness SKIN:intact,warm,dry   PERTINENT DATA     Infusions: . fentaNYL infusion INTRAVENOUS 150 mcg/hr (Apr 18, 2020 1535)  . morphine    . norepinephrine (LEVOPHED) Adult infusion 10 mcg/min (18-Apr-2020 1535)   Scheduled Medications: . aspirin  324 mg Oral NOW   Or  . aspirin  300 mg Rectal NOW  . enoxaparin (LOVENOX) injection  30 mg Subcutaneous Q24H   PRN Medications: docusate sodium, polyethylene glycol Hemodynamic parameters:   Intake/Output: No intake/output data  recorded.  Ventilator  Settings: Vent Mode: AC FiO2 (%):  [100 %] 100 % Set Rate:  [1818 bmp] 1818 bmp Vt Set:  [450 mL] 450 mL PEEP:  [5 cmH20] 5 cmH20   LAB RESULTS:  Basic Metabolic Panel: Recent Labs  Lab 03/23/20 0629 03/23/20 0629 03/24/20 0444 03/25/20 0514 03/26/20 0621 03/26/20 0621 03/27/20 0617 03/27/20 0617 04-18-2020 1002 April 18, 2020 1511  NA 136  --   --   --  136  --  136  --  140 139  K 3.8   < >  --   --  4.0   < > 4.0   < > 4.2 4.6  CL 100  --   --   --  96*  --  97*  --  104 100  CO2 26  --   --   --  29  --  29  --  25 27  GLUCOSE 127*  --   --   --  151*  --  160*  --  105* 93  BUN 17  --   --   --  28*  --  30*  --  46* 47*  CREATININE 0.88  --   --   --  0.70  --  0.85  --  2.40* 2.35*  CALCIUM 8.7*  --   --   --  8.8*  --  8.9  --  8.1* 8.3*  MG 1.7   < > 1.8 1.6* 2.2  --  2.3  --  1.8  --   PHOS 2.9  --  2.9 4.6 3.1  --  3.3  --   --   --    < > = values in this interval not displayed.   Liver Function Tests: Recent Labs  Lab 03/22/20 2040 03/26/20 0621 03/27/20 0617 04-10-20 1002  AST 15 22 23  43*  ALT 10 12 15 15   ALKPHOS 78 61 62 42  BILITOT 0.5 1.0 1.1 1.4*  PROT 6.5 6.2* 6.4* 5.1*  ALBUMIN 3.5 2.9* 3.1* 2.2*   No results for input(s): LIPASE, AMYLASE in the last 168 hours. No results for input(s): AMMONIA in the last 168 hours. CBC: Recent Labs  Lab 03/24/20 0444 03/24/20 0444 03/25/20 0514 03/26/20 0621 03/27/20 0617 04-10-2020 1002 04-10-20 1511  WBC 4.8   < > 8.4 6.4 6.1 5.8 6.5  NEUTROABS 3.9  --  7.2 5.5 5.2 5.0  --   HGB 12.8*   < > 12.6* 13.7 14.3 12.8* 13.0  HCT 38.1*   < > 37.5* 39.0 41.0 38.0* 40.4  MCV 94.3   < > 93.5 91.1 91.3 95.0 97.6  PLT 114*   < > 143* 146* 164 179 185   < > = values in this interval not displayed.   Cardiac Enzymes: No results for input(s): CKTOTAL, CKMB, CKMBINDEX, TROPONINI in the last 168 hours. BNP: Invalid input(s): POCBNP CBG: Recent Labs  Lab 03/26/20 1646 03/26/20 2030  03/27/20 0807 03/27/20 1204 03/27/20 1656  GLUCAP 122* 114* 162* 167* 134*       IMAGING RESULTS:  Imaging: CT Head Wo Contrast  Result Date: April 10, 2020 CLINICAL DATA:  Unresponsive, mental status change EXAM: CT HEAD WITHOUT CONTRAST TECHNIQUE: Contiguous axial images were obtained from the base of the skull through the vertex without intravenous contrast. COMPARISON:  08/13/2019 FINDINGS: Brain: There is no acute intracranial hemorrhage, mass effect, or edema. Gray-white differentiation is preserved. There is no extra-axial fluid collection. Patchy and confluent areas of hypoattenuation in the supratentorial white matter likely reflect stable chronic microvascular ischemic changes. Prominence of the ventricles and sulci reflects stable parenchymal volume loss. Vascular: There is atherosclerotic calcification at the skull base. Skull: Calvarium is unremarkable. Sinuses/Orbits: No acute finding. Other: None. IMPRESSION: No acute intracranial abnormality. Stable chronic findings detailed above. Electronically Signed   By: Macy Mis M.D.   On: 2020-04-10 13:14   DG Chest Portable 1 View  Result Date: 04/10/2020 CLINICAL DATA:  COVID pneumonia.  Status post intubation. EXAM: PORTABLE CHEST 1 VIEW COMPARISON:  03/22/2020 FINDINGS: ET tube tip is in satisfactory position above the carina. NG tube tip and side port well below the GE junction. Cardiomediastinal contours are normal. Aortic atherosclerotic calcifications noted. Significant progression of diffuse asymmetric airspace opacification involving the left lung. Mild diffuse increase interstitial markings are noted throughout the right lung, similar. IMPRESSION: 1. Significant progression of diffuse airspace opacification involving the left lung. 2. Persistent interstitial coarsening throughout the right lung. 3. ETT tip in satisfactory position above the carina. Electronically Signed   By: Kerby Moors M.D.   On: 04-10-20 11:15    @PROBHOSP @ CT Head Wo Contrast  Result Date: 2020-04-10 CLINICAL DATA:  Unresponsive, mental status change EXAM: CT HEAD WITHOUT CONTRAST TECHNIQUE: Contiguous axial images were obtained from the base of the skull through the vertex without intravenous contrast. COMPARISON:  08/13/2019 FINDINGS: Brain: There is no acute intracranial hemorrhage, mass effect, or edema. Gray-white differentiation is preserved. There is no extra-axial fluid collection. Patchy and confluent areas of hypoattenuation in the supratentorial white matter likely reflect stable chronic microvascular ischemic changes. Prominence of the ventricles and sulci reflects stable parenchymal volume loss. Vascular: There is atherosclerotic calcification at the skull base. Skull: Calvarium is unremarkable. Sinuses/Orbits: No acute finding. Other: None. IMPRESSION:  No acute intracranial abnormality. Stable chronic findings detailed above. Electronically Signed   By: Macy Mis M.D.   On: 2020/04/08 13:14   DG Chest Portable 1 View  Result Date: 04/08/2020 CLINICAL DATA:  COVID pneumonia.  Status post intubation. EXAM: PORTABLE CHEST 1 VIEW COMPARISON:  03/22/2020 FINDINGS: ET tube tip is in satisfactory position above the carina. NG tube tip and side port well below the GE junction. Cardiomediastinal contours are normal. Aortic atherosclerotic calcifications noted. Significant progression of diffuse asymmetric airspace opacification involving the left lung. Mild diffuse increase interstitial markings are noted throughout the right lung, similar. IMPRESSION: 1. Significant progression of diffuse airspace opacification involving the left lung. 2. Persistent interstitial coarsening throughout the right lung. 3. ETT tip in satisfactory position above the carina. Electronically Signed   By: Kerby Moors M.D.   On: Apr 08, 2020 11:15     ASSESSMENT AND PLAN    -Multidisciplinary rounds held today  Acute Hypoxic Respiratory Failure -Severe  ARDS due to COVID19 Acute COVID19 pneumonia -vitamin C -zinc -decadron 6mg  IV daily  -supportive care with ICU telemetry monitoring -PT/OT when possible -procalcitonin, CRP and ferritin trending    This document was prepared using Dragon voice recognition software and may include unintentional dictation errors.    Ottie Glazier, M.D.  Division of [place]

## 2020-04-02 NOTE — TOC Progression Note (Signed)
Transition of Care [facility]) - Progression Note    Patient Details  Name: Roy Hernandez MRN: [id number] Date of Birth: 15-Mar-1928  Transition of [facility]) CM/SW Contact  Zigmund Daniel Dorian Pod, RN Phone Number: 04-28-20, 1:31 PM  Clinical Narrative:     Received a call from the floor where pt recently discharged to contact the daughter [name] [phone]) concerning MCR IM notification. Based upon the distress conversation with the loss of her sister the night before and pt has readmitted to the ED RN unable to obtain detail information. Therefore the daughter indicated she would follow up next week with the department however stress the importance of needing this letter.   RN contact TOC supervisor with the information and provided information to resolve this matter. Supervisor will contact the pt's family member directly [facility]) and provide the necessary information.  No additional request at this time. Note all parties involved with this request have been notified.       Expected Discharge Plan and Services                                                 Social Determinants of Health (SDOH) Interventions    Readmission Risk Interventions No flowsheet data found.

## 2020-04-02 NOTE — Consult Note (Signed)
CRITICAL CARE PROGRESS NOTE    Name: Roy Hernandez MRN: [id number] DOB: January 03, 1928     LOS: 0   SUBJECTIVE FINDINGS & SIGNIFICANT EVENTS    Patient description:  86 yo M with COVID19 induced acute hypoxemic respiratory failure.  He was discharged from [facility] 03/27/20 post acute COVID.  He was at [facility] noted to be poorly response was brought in by EMS and intubated in the field.   I spoke to daughter Roy Hernandez, she had just lost her sister to [place] and was very sad however understands patient is deteriorating. We discussed goals of care, his son is on the way to see him and plan is for comfort measures after son comes in to see patient. At this time daughter does not wish for CPR and code status is DNR.   Lines/tubes : Airway 7 mm (Active)  Secured at (cm) 25 cm 21-Apr-2020 1010  Measured From Lips 2020-04-21 1010  Secured Location Right 2020-04-21 1010  Secured By [employer] 2020/04/21 1010  Site Condition Dry April 21, 2020 0955     NG/OG Tube Orogastric 16 Fr. Right mouth Xray (Active)  Cm Marking at Nare/Corner of Mouth (if applicable) 55 cm 85/27/78 1029  Site Assessment Clean;Dry;Intact 04-21-2020 1029     Urethral Catheter Roy Goody, RN Temperature probe 16 Fr. (Active)  Indication for Insertion or Continuance of Catheter Unstable critically ill patients first 24-48 hours (See Criteria) April 21, 2020 1044  Site Assessment Clean;Intact;Dry 04-21-20 1044  Catheter Maintenance Bag below level of bladder;Catheter secured;Drainage bag/tubing not touching floor;Insertion date on drainage bag;No dependent loops;Seal intact 21-Apr-2020 1044  Collection Container Standard drainage bag 04/21/20 1044  Securement Method Securing device (Describe) 04-21-2020 1044    Microbiology/Sepsis markers: Results for orders placed or  performed during the hospital encounter of 03/22/20  Culture, blood (Routine x 2)     Status: None   Collection Time: 03/22/20  8:40 PM   Specimen: BLOOD  Result Value Ref Range Status   Specimen Description BLOOD BLOOD LEFT WRIST  Final   Special Requests   Final    BOTTLES DRAWN AEROBIC AND ANAEROBIC Blood Culture results may not be optimal due to an excessive volume of blood received in culture bottles   Culture   Final    NO GROWTH 5 DAYS Performed at [facility], [address], [place], [place] [address]    Report Status 03/27/2020 FINAL  Final  Culture, blood (Routine x 2)     Status: None   Collection Time: 03/22/20  8:50 PM   Specimen: BLOOD  Result Value Ref Range Status   Specimen Description BLOOD BLOOD RIGHT ARM  Final   Special Requests   Final    BOTTLES DRAWN AEROBIC AND ANAEROBIC Blood Culture adequate volume   Culture   Final    NO GROWTH 5 DAYS Performed at [facility], [address], [place], [place] [address]    Report Status 03/27/2020 FINAL  Final  SARS Coronavirus 2 by RT PCR (hospital order, performed in [place] hospital lab) Nasopharyngeal Nasopharyngeal Swab     Status: Abnormal   Collection Time: 03/22/20  8:51 PM   Specimen: Nasopharyngeal Swab  Result Value Ref Range Status   SARS Coronavirus 2 POSITIVE (A) NEGATIVE Final    Comment: RESULT CALLED TO, READ BACK BY AND VERIFIED WITH: L FERGUSON 03/22/20 AT 2232 MJU (NOTE) SARS-CoV-2 target nucleic acids are DETECTED  SARS-CoV-2 RNA is generally detectable in upper respiratory specimens  during the  acute phase of infection.  Positive results are indicative  of the presence of the identified virus, but do not rule out bacterial infection or co-infection with other pathogens not detected by the test.  Clinical correlation with patient history and  other diagnostic information is necessary to determine patient infection status.  The expected result is negative.  Fact Sheet  for Patients:   StrictlyIdeas.no   Fact Sheet for Healthcare Providers:   BankingDealers.co.za    This test is not yet approved or cleared by the [place] FDA and  has been authorized for detection and/or diagnosis of SARS-CoV-2 by FDA under an Emergency Use Authorization (EUA).  This EUA will remain in effect (meaning this test c an be used) for the duration of  the COVID-19 declaration under Section 564(b)(1) of the Act, 21 U.S.C. section 360-bbb-3(b)(1), unless the authorization is terminated or revoked sooner.  Performed at [facility], [address], [place], [place] [address]     Anti-infectives:  Anti-infectives (From admission, onward)   None      PAST MEDICAL HISTORY   Past Medical History:  Diagnosis Date  . AAA (abdominal aortic aneurysm) ([place])   . Arthritis   . Basal cell carcinoma 06/18/2013   L spinal upper back  . Cataract   . Dementia ([place])   . Diabetes mellitus without complication (HCC)    diet controlled  . Glaucoma   . Hyperkalemia   . Hyperlipidemia   . Hypertension   . Inguinal hernia of right side without obstruction or gangrene   . Melanoma in situ ([place]) 07/05/2016   L post shoulder/excision  . Osteoporosis   . Peripheral vascular disease ([place])   . Vitamin D deficiency      SURGICAL HISTORY   Past Surgical History:  Procedure Laterality Date  . CATARACT EXTRACTION Bilateral   . HERNIA REPAIR  June 2019  . INGUINAL HERNIA REPAIR Right 01/27/2018   Medium Ultra Pro mesh.  Surgeon: Robert Bellow, MD;  Location: [facility] ORS;  Service: General;  Laterality: Right;  with mesh  . TOOTH EXTRACTION       FAMILY HISTORY   Family History  Problem Relation Age of Onset  . Cancer Mother   . Hypertension Mother   . Diabetes Father   . Hyperlipidemia Father   . Diabetes Maternal Grandmother   . Diabetes Brother   . Kidney disease Brother        on dialysis  . Diabetes  Sister   . Kidney disease Sister        dialysis  . Cancer Brother        esophageal  . Leukemia Sister      SOCIAL HISTORY   Social History   Tobacco Use  . Smoking status: Former Smoker    Packs/day: 1.00    Years: 34.00    Pack years: 34.00    Types: Cigarettes    Start date: 08/02/1945    Quit date: 05/26/1980    Years since quitting: 39.8  . Smokeless tobacco: Never Used  . Tobacco comment: smoking cessation materials not required  Vaping Use  . Vaping Use: Never used  Substance Use Topics  . Alcohol use: No    Alcohol/week: 0.0 standard drinks  . Drug use: No     MEDICATIONS   Current Medication:  Current Facility-Administered Medications:  .  aspirin chewable tablet 324 mg, 324 mg, Oral, NOW **OR** aspirin suppository 300 mg, 300 mg, Rectal, NOW, Afnan Cadiente,  MD .  docusate sodium (COLACE) capsule 100 mg, 100 mg, Oral, BID PRN, Lanney Gins, Kimori Tartaglia, MD .  enoxaparin (LOVENOX) injection 30 mg, 30 mg, Subcutaneous, Q24H, Damarkus Balis, MD .  fentaNYL 2566mcg in NS 254mL (85mcg/ml) infusion-PREMIX, 50 mcg/hr, Intravenous, Continuous, Vladimir Crofts, MD, Last Rate: 15 mL/hr at 2020-04-24 1423, 150 mcg/hr at 04/24/20 1423 .  norepinephrine (LEVOPHED) 4mg  in 239mL premix infusion, 0-40 mcg/min, Intravenous, Continuous, Vladimir Crofts, MD, Last Rate: 18.75 mL/hr at 2020-04-24 1423, 5 mcg/min at 2020-04-24 1423 .  polyethylene glycol (MIRALAX / GLYCOLAX) packet 17 g, 17 g, Oral, Daily PRN, Ottie Glazier, MD  Current Outpatient Medications:  .  acetaminophen (TYLENOL) 500 MG tablet, Take 500 mg by mouth at bedtime., Disp: , Rfl:  .  alendronate (FOSAMAX) 70 MG tablet, TAKE 1 TABLET EVERY MONDAY. TAKE WITH A FULL GLASS OF WATER ON AN EMPTY STOMACH. (Patient taking differently: Take 140 mg by mouth daily. ), Disp: 12 tablet, Rfl: 3 .  ascorbic acid (VITAMIN C) 500 MG tablet, Take 1 tablet (500 mg total) by mouth daily., Disp: , Rfl:  .  aspirin EC 81 MG tablet, Take 81 mg by mouth  at bedtime., Disp: , Rfl:  .  Carboxymethylcellulose Sodium (LUBRICANT EYE DROPS PF OP), Place 1 drop into both eyes 3 (three) times daily as needed (for dry eyes.). , Disp: , Rfl:  .  cholecalciferol (VITAMIN D) 1000 UNITS tablet, Take 1,000 Units by mouth daily. , Disp: , Rfl:  .  clotrimazole-betamethasone (LOTRISONE) cream, , Disp: , Rfl:  .  dexamethasone (DECADRON) 6 MG tablet, Take 1 tablet (6 mg total) by mouth daily for 7 days., Disp: 7 tablet, Rfl: 0 .  donepezil (ARICEPT) 10 MG tablet, TAKE 1 TABLET AT BEDTIME (Patient taking differently: Take 10 mg by mouth at bedtime. ), Disp: 90 tablet, Rfl: 1 .  finasteride (PROSCAR) 5 MG tablet, Take 1 tablet (5 mg total) by mouth daily., Disp: 90 tablet, Rfl: 3 .  fluticasone (FLONASE) 50 MCG/ACT nasal spray, USE 2 SPRAYS INTO BOTH NOSTRILS DAILY., Disp: 48 g, Rfl: 1 .  latanoprost (XALATAN) 0.005 % ophthalmic solution, Place 1 drop into both eyes at bedtime. , Disp: , Rfl:  .  loratadine (CLARITIN) 10 MG tablet, TAKE 1 TABLET EVERY DAY (Patient taking differently: Take 10 mg by mouth daily. ), Disp: 90 tablet, Rfl: 0 .  memantine (NAMENDA) 5 MG tablet, Take 1 tablet (5 mg total) by mouth 2 (two) times daily., Disp: 180 tablet, Rfl: 1 .  Menthol-Methyl Salicylate (MUSCLE RUB EX), Apply 1 application topically 4 (four) times daily as needed (for pain.). , Disp: , Rfl:  .  mupirocin ointment (BACTROBAN) 2 %, Apply to ulceration every 3-4 days when changing Duoderm, Disp: 22 g, Rfl: 0 .  simvastatin (ZOCOR) 5 MG tablet, TAKE 1 TABLET AT BEDTIME (Patient taking differently: Take 5 mg by mouth at bedtime. ), Disp: 90 tablet, Rfl: 1 .  tamsulosin (FLOMAX) 0.4 MG CAPS capsule, Take 2 capsules (0.8 mg total) by mouth daily., Disp: 90 capsule, Rfl: 3 .  timolol (TIMOPTIC) 0.5 % ophthalmic solution, Place 1 drop into both eyes daily. , Disp: , Rfl:  .  vitamin B-12 (CYANOCOBALAMIN) 1000 MCG tablet, Take 1,000 mcg by mouth daily. , Disp: , Rfl:  .  zinc  sulfate 220 (50 Zn) MG capsule, Take 1 capsule (220 mg total) by mouth daily., Disp: , Rfl:     ALLERGIES   Patient has no known allergies.  REVIEW OF SYSTEMS    Unable to obtain due to MV and comatose state  PHYSICAL EXAMINATION   Vital Signs: Temp:  [97.2 F (36.2 C)-99 F (37.2 C)] 99 F (37.2 C) (08/28 1500) Pulse Rate:  [94-126] 125 (08/28 1500) Resp:  [15-26] 17 (08/28 1500) BP: (68-116)/(38-77) 96/58 (08/28 1500) SpO2:  [85 %-95 %] 86 % (08/28 1500) FiO2 (%):  [100 %] 100 % (08/28 0955) Weight:  [73.7 kg] 73.7 kg (08/28 1000)  GENERAL:Age appropriate on MV HEAD: Normocephalic, atraumatic.  EYES: Pupils equal, round, reactive to light.  No scleral icterus.  MOUTH: Moist mucosal membrane. NECK: Supple. No thyromegaly. No nodules. No JVD.  PULMONARY: rhonchi bialterally  CARDIOVASCULAR: S1 and S2. Regular rate and rhythm. No murmurs, rubs, or gallops.  GASTROINTESTINAL: Soft, nontender, non-distended. No masses. Positive bowel sounds. No hepatosplenomegaly.  MUSCULOSKELETAL: No swelling, clubbing, or edema.  NEUROLOGIC: Mild distress due to acute illness SKIN:intact,warm,dry   PERTINENT DATA     Infusions: . fentaNYL infusion INTRAVENOUS 150 mcg/hr (04/18/2020 1423)  . norepinephrine (LEVOPHED) Adult infusion 5 mcg/min (Apr 18, 2020 1423)   Scheduled Medications: . aspirin  324 mg Oral NOW   Or  . aspirin  300 mg Rectal NOW  . enoxaparin (LOVENOX) injection  30 mg Subcutaneous Q24H   PRN Medications: docusate sodium, polyethylene glycol Hemodynamic parameters:   Intake/Output: No intake/output data recorded.  Ventilator  Settings: Vent Mode: AC FiO2 (%):  [100 %] 100 % Set Rate:  [1818 bmp] 1818 bmp Vt Set:  [450 mL] 450 mL PEEP:  [5 cmH20] 5 cmH20   LAB RESULTS:  Basic Metabolic Panel: Recent Labs  Lab 03/22/20 2040 03/22/20 2040 03/23/20 0629 03/23/20 0629 03/24/20 0444 03/25/20 0514 03/26/20 0621 03/26/20 0621 03/27/20 0617  April 18, 2020 1002  NA 131*  --  136  --   --   --  136  --  136 140  K 3.7   < > 3.8   < >  --   --  4.0   < > 4.0 4.2  CL 94*  --  100  --   --   --  96*  --  97* 104  CO2 27  --  26  --   --   --  29  --  29 25  GLUCOSE 139*  --  127*  --   --   --  151*  --  160* 105*  BUN 23  --  17  --   --   --  28*  --  30* 46*  CREATININE 0.92  --  0.88  --   --   --  0.70  --  0.85 2.40*  CALCIUM 8.8*  --  8.7*  --   --   --  8.8*  --  8.9 8.1*  MG  --   --  1.7   < > 1.8 1.6* 2.2  --  2.3 1.8  PHOS  --   --  2.9  --  2.9 4.6 3.1  --  3.3  --    < > = values in this interval not displayed.   Liver Function Tests: Recent Labs  Lab 03/22/20 2040 03/26/20 0621 03/27/20 0617 2020-04-18 1002  AST 15 22 23  43*  ALT 10 12 15 15   ALKPHOS 78 61 62 42  BILITOT 0.5 1.0 1.1 1.4*  PROT 6.5 6.2* 6.4* 5.1*  ALBUMIN 3.5 2.9* 3.1* 2.2*   No results for input(s): LIPASE, AMYLASE in the last  168 hours. No results for input(s): AMMONIA in the last 168 hours. CBC: Recent Labs  Lab 03/24/20 0444 03/25/20 0514 03/26/20 0621 03/27/20 0617 04/15/20 1002  WBC 4.8 8.4 6.4 6.1 5.8  NEUTROABS 3.9 7.2 5.5 5.2 5.0  HGB 12.8* 12.6* 13.7 14.3 12.8*  HCT 38.1* 37.5* 39.0 41.0 38.0*  MCV 94.3 93.5 91.1 91.3 95.0  PLT 114* 143* 146* 164 179   Cardiac Enzymes: No results for input(s): CKTOTAL, CKMB, CKMBINDEX, TROPONINI in the last 168 hours. BNP: Invalid input(s): POCBNP CBG: Recent Labs  Lab 03/26/20 1646 03/26/20 2030 03/27/20 0807 03/27/20 1204 03/27/20 1656  GLUCAP 122* 114* 162* 167* 134*       IMAGING RESULTS:  Imaging: CT Head Wo Contrast  Result Date: 04-15-20 CLINICAL DATA:  Unresponsive, mental status change EXAM: CT HEAD WITHOUT CONTRAST TECHNIQUE: Contiguous axial images were obtained from the base of the skull through the vertex without intravenous contrast. COMPARISON:  08/13/2019 FINDINGS: Brain: There is no acute intracranial hemorrhage, mass effect, or edema. Gray-white  differentiation is preserved. There is no extra-axial fluid collection. Patchy and confluent areas of hypoattenuation in the supratentorial white matter likely reflect stable chronic microvascular ischemic changes. Prominence of the ventricles and sulci reflects stable parenchymal volume loss. Vascular: There is atherosclerotic calcification at the skull base. Skull: Calvarium is unremarkable. Sinuses/Orbits: No acute finding. Other: None. IMPRESSION: No acute intracranial abnormality. Stable chronic findings detailed above. Electronically Signed   By: Macy Mis M.D.   On: Apr 15, 2020 13:14   DG Chest Portable 1 View  Result Date: 04-15-2020 CLINICAL DATA:  COVID pneumonia.  Status post intubation. EXAM: PORTABLE CHEST 1 VIEW COMPARISON:  03/22/2020 FINDINGS: ET tube tip is in satisfactory position above the carina. NG tube tip and side port well below the GE junction. Cardiomediastinal contours are normal. Aortic atherosclerotic calcifications noted. Significant progression of diffuse asymmetric airspace opacification involving the left lung. Mild diffuse increase interstitial markings are noted throughout the right lung, similar. IMPRESSION: 1. Significant progression of diffuse airspace opacification involving the left lung. 2. Persistent interstitial coarsening throughout the right lung. 3. ETT tip in satisfactory position above the carina. Electronically Signed   By: Kerby Moors M.D.   On: 2020/04/15 11:15   @PROBHOSP @ CT Head Wo Contrast  Result Date: 04/15/20 CLINICAL DATA:  Unresponsive, mental status change EXAM: CT HEAD WITHOUT CONTRAST TECHNIQUE: Contiguous axial images were obtained from the base of the skull through the vertex without intravenous contrast. COMPARISON:  08/13/2019 FINDINGS: Brain: There is no acute intracranial hemorrhage, mass effect, or edema. Gray-white differentiation is preserved. There is no extra-axial fluid collection. Patchy and confluent areas of  hypoattenuation in the supratentorial white matter likely reflect stable chronic microvascular ischemic changes. Prominence of the ventricles and sulci reflects stable parenchymal volume loss. Vascular: There is atherosclerotic calcification at the skull base. Skull: Calvarium is unremarkable. Sinuses/Orbits: No acute finding. Other: None. IMPRESSION: No acute intracranial abnormality. Stable chronic findings detailed above. Electronically Signed   By: Macy Mis M.D.   On: 15-Apr-2020 13:14   DG Chest Portable 1 View  Result Date: 15-Apr-2020 CLINICAL DATA:  COVID pneumonia.  Status post intubation. EXAM: PORTABLE CHEST 1 VIEW COMPARISON:  03/22/2020 FINDINGS: ET tube tip is in satisfactory position above the carina. NG tube tip and side port well below the GE junction. Cardiomediastinal contours are normal. Aortic atherosclerotic calcifications noted. Significant progression of diffuse asymmetric airspace opacification involving the left lung. Mild diffuse increase interstitial markings are noted throughout the right  lung, similar. IMPRESSION: 1. Significant progression of diffuse airspace opacification involving the left lung. 2. Persistent interstitial coarsening throughout the right lung. 3. ETT tip in satisfactory position above the carina. Electronically Signed   By: Kerby Moors M.D.   On: 2020-04-23 11:15     ASSESSMENT AND PLAN    -Multidisciplinary rounds held today  Acute Hypoxic Respiratory Failure -Severe ARDS due to COVID19 Acute COVID19 pneumonia -vitamin C -zinc -decadron 6mg  IV daily  -supportive care with ICU telemetry monitoring -PT/OT when possible -procalcitonin, CRP and ferritin trending   ID -continue IV abx as prescibed -follow up cultures  GI/Nutrition GI PROPHYLAXIS as indicated DIET-->TF's as tolerated Constipation protocol as indicated  ENDO - ICU hypoglycemic\Hyperglycemia protocol -check FSBS per protocol   ELECTROLYTES -follow labs as  needed -replace as needed -pharmacy consultation   DVT/GI PRX ordered -SCDs  TRANSFUSIONS AS NEEDED MONITOR FSBS ASSESS the need for LABS as needed   Critical care provider statement:    Critical care time (minutes):  109   Critical care time was exclusive of:  Separately billable procedures and treating other patients   Critical care was necessary to treat or prevent imminent or life-threatening deterioration of the following conditions:  Acute hypoxemic respiratory failure due to [place] with ARDS   Critical care was time spent personally by me on the following activities:  Development of treatment plan with patient or surrogate, discussions with consultants, evaluation of patient's response to treatment, examination of patient, obtaining history from patient or surrogate, ordering and performing treatments and interventions, ordering and review of laboratory studies and re-evaluation of patient's condition.  I assumed direction of critical care for this patient from another provider in my specialty: no    This document was prepared using Dragon voice recognition software and may include unintentional dictation errors.    Ottie Glazier, M.D.  Division of [place]

## 2020-04-02 NOTE — ED Notes (Signed)
Family member gowned up and provided N95. Witnessed by this Therapist, sports. Informed EDP that family member is at bedside.

## 2020-04-02 NOTE — ED Notes (Signed)
Dr. Lanney Gins informed of pt oxygen sats remaining in 80's despite 100% on the vent.

## 2020-04-02 NOTE — ED Provider Notes (Signed)
[facility] Emergency Department Provider Note ____________________________________________   First MD Initiated Contact with Patient 04/13/20 1000     (approximate)  I have reviewed the triage vital signs and the nursing notes.  HISTORY  Chief Complaint Unresponsive   HPI Roy Hernandez is a 86 y.o. malewho presents to the ED for evaluation of unresponsiveness.  Chart review indicates recent admission from 8/21-8/26 for acute hypoxic respiratory failure attributed to COVID-19.  Patient is fully vaccinated.  Steroids initiated on 8/22, remdesivir from 8/22-8/26.  Patient presents from [place] intubated after being found unresponsive.  EMS reports patient was found by staff this morning unresponsive, and are unable to provide a time of when he was last normal.  EMS found him with sats in the 50s and unresponsive, he was intubated without medications.  History limited due to acuity and intubated patient.  Past Medical History:  Diagnosis Date  . AAA (abdominal aortic aneurysm) ([place])   . Arthritis   . Basal cell carcinoma 06/18/2013   L spinal upper back  . Cataract   . Dementia ([place])   . Diabetes mellitus without complication (HCC)    diet controlled  . Glaucoma   . Hyperkalemia   . Hyperlipidemia   . Hypertension   . Inguinal hernia of right side without obstruction or gangrene   . Melanoma in situ ([place]) 07/05/2016   L post shoulder/excision  . Osteoporosis   . Peripheral vascular disease ([address])   . Vitamin D deficiency     Patient Active Problem List   Diagnosis Date Noted  . Acute hypoxemic respiratory failure due to COVID-19 ([place]) 03/23/2020  . Pneumonia due to COVID-19 virus 03/23/2020  . Hypotension 03/23/2020  . Dementia ([place]) 03/23/2020  . Thrombocytopenia ([place]) 03/23/2020  . COVID-19 03/22/2020  . Hypoxia 03/22/2020  . Dehydration 03/22/2020  . Senile purpura ([place]) 09/01/2018  . Neuropathy 06/16/2018  . Bence Jones  proteinuria 02/13/2018  . Left bundle-branch block 02/13/2018  . History of right inguinal hernia repair 12/16/2017  . Epiretinal membrane (ERM) of right eye 10/11/2017  . DJD (degenerative joint disease) 10/04/2017  . Hyperglycemia 10/04/2017  . Advanced stage glaucoma 10/04/2017  . Hypertension, benign 07/06/2017  . PAD (peripheral artery disease) ([place]) 10/19/2016  . Osteoporosis of femur without pathological fracture 11/25/2015  . AAA (abdominal aortic aneurysm) without rupture ([place]) 11/05/2014  . Abnormal ECG 11/05/2014  . At risk for falling 11/05/2014  . DD (diverticular disease) 11/05/2014  . Dyslipidemia 11/05/2014  . Failure of erection 11/05/2014  . Degenerative arthritis of lumbar spine 11/05/2014  . Compressed spine fracture ([place]) 11/05/2014  . Basal cell papilloma 11/05/2014  . Vitamin D deficiency 11/05/2014    Past Surgical History:  Procedure Laterality Date  . CATARACT EXTRACTION Bilateral   . HERNIA REPAIR  June 2019  . INGUINAL HERNIA REPAIR Right 01/27/2018   Medium Ultra Pro mesh.  Surgeon: Robert Bellow, MD;  Location: [facility] ORS;  Service: General;  Laterality: Right;  with mesh  . TOOTH EXTRACTION      Prior to Admission medications   Medication Sig Start Date End Date Taking? Authorizing Provider  acetaminophen (TYLENOL) 500 MG tablet Take 500 mg by mouth at bedtime.   Yes [provider]  alendronate (FOSAMAX) 70 MG tablet TAKE 1 TABLET EVERY MONDAY. TAKE WITH A FULL GLASS OF WATER ON AN EMPTY STOMACH. Patient taking differently: Take 140 mg by mouth daily.  06/29/19  Yes [name], MD  ascorbic  acid (VITAMIN C) 500 MG tablet Take 1 tablet (500 mg total) by mouth daily. 03/27/20  Yes Sreenath, Sudheer B, MD  aspirin EC 81 MG tablet Take 81 mg by mouth at bedtime.   Yes [provider]  Carboxymethylcellulose Sodium (LUBRICANT EYE DROPS PF OP) Place 1 drop into both eyes 3 (three) times daily as needed (for dry eyes.).  11/11/17   Yes [provider]  cholecalciferol (VITAMIN D) 1000 UNITS tablet Take 1,000 Units by mouth daily.  11/26/11  Yes Roselee Nova, MD  clotrimazole-betamethasone Donalynn Furlong) cream  11/22/19  Yes [provider]  dexamethasone (DECADRON) 6 MG tablet Take 1 tablet (6 mg total) by mouth daily for 7 days. 03/27/20 04/03/20 Yes Sreenath, Sudheer B, MD  donepezil (ARICEPT) 10 MG tablet TAKE 1 TABLET AT BEDTIME Patient taking differently: Take 10 mg by mouth at bedtime.  02/08/20  Yes Sowles, Drue Stager, MD  finasteride (PROSCAR) 5 MG tablet Take 1 tablet (5 mg total) by mouth daily. 11/29/19  Yes Billey Co, MD  fluticasone (FLONASE) 50 MCG/ACT nasal spray USE 2 SPRAYS INTO BOTH NOSTRILS DAILY. 08/15/19  Yes Sowles, Drue Stager, MD  latanoprost (XALATAN) 0.005 % ophthalmic solution Place 1 drop into both eyes at bedtime.    Yes [provider]  loratadine (CLARITIN) 10 MG tablet TAKE 1 TABLET EVERY DAY Patient taking differently: Take 10 mg by mouth daily.  02/22/20  Yes Sowles, Drue Stager, MD  memantine (NAMENDA) 5 MG tablet Take 1 tablet (5 mg total) by mouth 2 (two) times daily. 11/14/19  Yes Sowles, Drue Stager, MD  Menthol-Methyl Salicylate (MUSCLE RUB EX) Apply 1 application topically 4 (four) times daily as needed (for pain.).  08/11/16  Yes [provider]  mupirocin ointment (BACTROBAN) 2 % Apply to ulceration every 3-4 days when changing Duoderm 02/06/20  Yes Brendolyn Patty, MD  simvastatin (ZOCOR) 5 MG tablet TAKE 1 TABLET AT BEDTIME Patient taking differently: Take 5 mg by mouth at bedtime.  01/21/20  Yes Sowles, Drue Stager, MD  tamsulosin (FLOMAX) 0.4 MG CAPS capsule Take 2 capsules (0.8 mg total) by mouth daily. 11/29/19  Yes Billey Co, MD  timolol (TIMOPTIC) 0.5 % ophthalmic solution Place 1 drop into both eyes daily.  04/28/15  Yes [provider]  vitamin B-12 (CYANOCOBALAMIN) 1000 MCG tablet Take 1,000 mcg by mouth daily.    Yes [provider]  zinc  sulfate 220 (50 Zn) MG capsule Take 1 capsule (220 mg total) by mouth daily. 03/27/20  Yes Sidney Ace, MD    Allergies Patient has no known allergies.  Family History  Problem Relation Age of Onset  . Cancer Mother   . Hypertension Mother   . Diabetes Father   . Hyperlipidemia Father   . Diabetes Maternal Grandmother   . Diabetes Brother   . Kidney disease Brother        on dialysis  . Diabetes Sister   . Kidney disease Sister        dialysis  . Cancer Brother        esophageal  . Leukemia Sister     Social History Social History   Tobacco Use  . Smoking status: Former Smoker    Packs/day: 1.00    Years: 34.00    Pack years: 34.00    Types: Cigarettes    Start date: 08/02/1945    Quit date: 05/26/1980    Years since quitting: 39.8  . Smokeless tobacco: Never Used  . Tobacco  comment: smoking cessation materials not required  Vaping Use  . Vaping Use: Never used  Substance Use Topics  . Alcohol use: No    Alcohol/week: 0.0 standard drinks  . Drug use: No    Review of Systems  Unable to be obtained due to patient's intubated and unresponsive status.  ____________________________________________   PHYSICAL EXAM:  VITAL SIGNS: Vitals:   Apr 16, 2020 1435 16-Apr-2020 1445  BP:  (!) 97/53  Pulse: (!) 120 (!) 123  Resp: 16 15  Temp: 98.9 F (37.2 C) 98.9 F (37.2 C)  SpO2: (!) 87% (!) 86%     Constitutional: Intubated without sedation.  Not following commands or opening his eyes. Eyes: Conjunctivae are normal. PERRL. EOMI. Head: Atraumatic. Nose: No congestion/rhinnorhea. Mouth/Throat: Mucous membranes are dry.  ETT in place and being bagged. Neck: No stridor. No cervical spine tenderness to palpation. Cardiovascular: Tachycardic rate, regular rhythm. Grossly normal heart sounds.  Good peripheral circulation. Respiratory: Bagged respirations via ETT.  Symmetric breath sounds Gastrointestinal: Soft , nondistended, nontender to palpation. No abdominal  bruits. No CVA tenderness. Musculoskeletal: No lower extremity tenderness nor edema.  No joint effusions. No signs of acute trauma. Neurologic: GCS 3 T Skin:  Skin is warm, dry and intact. No rash noted. Psychiatric: Unable to be assessed  ____________________________________________   LABS (all labs ordered are listed, but only abnormal results are displayed)  Labs Reviewed  CBC WITH DIFFERENTIAL/PLATELET - Abnormal; Notable for the following components:      Result Value   RBC 4.00 (*)    Hemoglobin 12.8 (*)    HCT 38.0 (*)    Lymphs Abs 0.5 (*)    Abs Immature Granulocytes 0.13 (*)    All other components within normal limits  COMPREHENSIVE METABOLIC PANEL - Abnormal; Notable for the following components:   Glucose, Bld 105 (*)    BUN 46 (*)    Creatinine, Ser 2.40 (*)    Calcium 8.1 (*)    Total Protein 5.1 (*)    Albumin 2.2 (*)    AST 43 (*)    Total Bilirubin 1.4 (*)    GFR calc non Af Amer 23 (*)    GFR calc Af Amer 27 (*)    All other components within normal limits  LACTIC ACID, PLASMA - Abnormal; Notable for the following components:   Lactic Acid, Venous 5.5 (*)    All other components within normal limits  BLOOD GAS, ARTERIAL - Abnormal; Notable for the following components:   pH, Arterial 7.26 (*)    pCO2 arterial 57 (*)    pO2, Arterial 58 (*)    Acid-base deficit 2.2 (*)    All other components within normal limits  TROPONIN I (HIGH SENSITIVITY) - Abnormal; Notable for the following components:   Troponin I (High Sensitivity) 271 (*)    All other components within normal limits  TROPONIN I (HIGH SENSITIVITY) - Abnormal; Notable for the following components:   Troponin I (High Sensitivity) 240 (*)    All other components within normal limits  MAGNESIUM   ____________________________________________  12 Lead EKG  Sinus rhythm, rate of 108 bpm, normal axis.  Left bundle branch block is present and intervals are otherwise normal.  No evidence of acute  ischemia per [place] ____________________________________________  RADIOLOGY  ED MD interpretation: CXR with multifocal opacities and ETT in good positioning.  Official radiology report(s): CT Head Wo Contrast  Result Date: 04-16-2020 CLINICAL DATA:  Unresponsive, mental status change EXAM: CT HEAD WITHOUT CONTRAST  TECHNIQUE: Contiguous axial images were obtained from the base of the skull through the vertex without intravenous contrast. COMPARISON:  08/13/2019 FINDINGS: Brain: There is no acute intracranial hemorrhage, mass effect, or edema. Gray-white differentiation is preserved. There is no extra-axial fluid collection. Patchy and confluent areas of hypoattenuation in the supratentorial white matter likely reflect stable chronic microvascular ischemic changes. Prominence of the ventricles and sulci reflects stable parenchymal volume loss. Vascular: There is atherosclerotic calcification at the skull base. Skull: Calvarium is unremarkable. Sinuses/Orbits: No acute finding. Other: None. IMPRESSION: No acute intracranial abnormality. Stable chronic findings detailed above. Electronically Signed   By: Macy Mis M.D.   On: 04/05/20 13:14   DG Chest Portable 1 View  Result Date: 04/05/20 CLINICAL DATA:  COVID pneumonia.  Status post intubation. EXAM: PORTABLE CHEST 1 VIEW COMPARISON:  03/22/2020 FINDINGS: ET tube tip is in satisfactory position above the carina. NG tube tip and side port well below the GE junction. Cardiomediastinal contours are normal. Aortic atherosclerotic calcifications noted. Significant progression of diffuse asymmetric airspace opacification involving the left lung. Mild diffuse increase interstitial markings are noted throughout the right lung, similar. IMPRESSION: 1. Significant progression of diffuse airspace opacification involving the left lung. 2. Persistent interstitial coarsening throughout the right lung. 3. ETT tip in satisfactory position above the carina.  Electronically Signed   By: Kerby Moors M.D.   On: Apr 05, 2020 11:15    ____________________________________________   PROCEDURES and INTERVENTIONS  Procedure(s) performed (including Critical Care):  .Critical Care Performed by: Vladimir Crofts, MD Authorized by: Vladimir Crofts, MD   Critical care provider statement:    Critical care time (minutes):  45   Critical care was necessary to treat or prevent imminent or life-threatening deterioration of the following conditions:  Cardiac failure and respiratory failure   Critical care was time spent personally by me on the following activities:  Discussions with consultants, evaluation of patient's response to treatment, examination of patient, ordering and performing treatments and interventions, ordering and review of laboratory studies, ordering and review of radiographic studies, pulse oximetry, re-evaluation of patient's condition, obtaining history from patient or surrogate and review of old charts    Medications  fentaNYL 2575mcg in NS 258mL (22mcg/ml) infusion-PREMIX (150 mcg/hr Intravenous Rate/Dose Verify 04-05-20 1423)  norepinephrine (LEVOPHED) 4mg  in 256mL premix infusion (5 mcg/min Intravenous Rate/Dose Verify 05-Apr-2020 1423)  lactated ringers bolus 1,000 mL (0 mLs Intravenous Stopped 05-Apr-2020 1108)  dexamethasone (DECADRON) injection 6 mg (6 mg Intravenous Given 05-Apr-2020 1016)  lactated ringers bolus 1,000 mL (0 mLs Intravenous Stopped 04/05/2020 1108)    ____________________________________________   MDM / ED COURSE  86 year old male with known COVID-19 presenting from SNF unresponsive, intubated in the field, and requiring ICU admission.  Patient appropriately admitted prehospital with EMS due to unresponsive status and hypoxic to the 50s.  On arrival to the ED, patient is hypotensive and hypoxic, initially responding well to fluid resuscitation and vent management, ultimately requiring low-dose Levophed to maintain maps of 65.   Hypoxia slowly improving with Decadron administration, vent adjustments for improved oxygenation, though his first ABG shows a severe ARDS P/F ratio.  CT head without ICH or evidence of acute CVA.  Blood work also demonstrating lactic acidosis, acute renal failure.  Patient's family indicates that he would not want these extreme measures for prolonged period of time based off of his living will, and likely plan to palliative extubate later today or tomorrow.  They are awaiting other family members to arrive and to see  the patient to say goodbye.  We will pursue appropriate medical management until that time.  Consulted intensivist who agrees to admit the patient to the ICU.   Clinical Course as of Mar 29 1446  Sat Mar 29, 2020  1025 Reassessed.  Good peripheral IV access with x4 18 20-gauge IVs.  2 L of LR ongoing.  Maps in the upper 25s.   [DS]  1038 Improving BP with fluids.  Maps in the 53s   [DS]  [place], daughter, oldest daughter and Media planner.    [DS]  3837 She reports understanding the severity of his critical illness and "that he doesn't have a good chance."  She reports that he has a living will and would not want chest compressions or resuscitation.  She reports "being okay with him on the ventilator for a couple days."  She will call her brother and discuss care with him   [DS]  1140 Hypotensive after 2 L of lactated Ringer's, requiring initiation of peripheral Levophed.   [DS]  41 Spoke with grandson at the bedside and explained clinical course and multiorgan failure.  We discussed patient's wishes for comfort measures, but family flying down to the hospital and wishes see the patient before he passes.  Started on plan to admit the patient to the ICU and continue current measures until family can arrive to see the patient before palliative extubation and comfort measures   [DS]    Clinical Course User Index [DS] Vladimir Crofts, MD    ____________________________________________   FINAL CLINICAL IMPRESSION(S) / ED DIAGNOSES  Final diagnoses:  Unresponsive  COVID-19  Acute hypoxemic respiratory failure due to COVID-19 ([place])  Lactic acidosis  Sinus tachycardia  AKI (acute kidney injury) [facility])    ED Discharge Orders    None      Saidi Santacroce   Note:  This document was prepared using Dragon voice recognition software and may include unintentional dictation errors.   Vladimir Crofts, MD 17-Apr-2020 1450

## 2020-04-02 NOTE — ED Notes (Signed)
Pt returned from [place] with this RN, Musician, and [employer] RT.

## 2020-04-02 NOTE — ED Notes (Signed)
EDP at bedside to talk to grandson.

## 2020-04-02 NOTE — ED Triage Notes (Signed)
Pt via EMS from [name]. Pt was found unresponsive by staff unknown how long pt was unresponsive, pt O2 sat on RA was in the 50s. EMS intubated en route. Pt is COVID+.

## 2020-04-02 NOTE — ED Notes (Signed)
Pt placed in post-mortum bag. Pt belongings: upper dentures, pair of socks, watch. Placed in belongings bag and placed on top of post-mortum bag. Stickers/EKG's placed in room. Ready for transport to morgue. Informed charge Therapist, sports.

## 2020-04-02 NOTE — ED Notes (Signed)
Date and time results received: 04-01-20 1120 (use smartphrase ".now" to insert current time)  Test: troponin Critical Value: 271  Name of Provider Notified: Tamala Julian

## 2020-04-02 DEATH — deceased

## 2020-08-29 ENCOUNTER — Ambulatory Visit: Payer: Medicare HMO | Admitting: Urology

## 2020-11-01 IMAGING — DX DG CHEST 1V PORT
1 series · 1 of 1 positions shown · non-contrast
Comparison: August 12, 2018

CLINICAL DATA: Shortness of breath, positive COVID test. History of
abnormal aorta.

EXAM:
PORTABLE CHEST 1 VIEW

[chest ap]
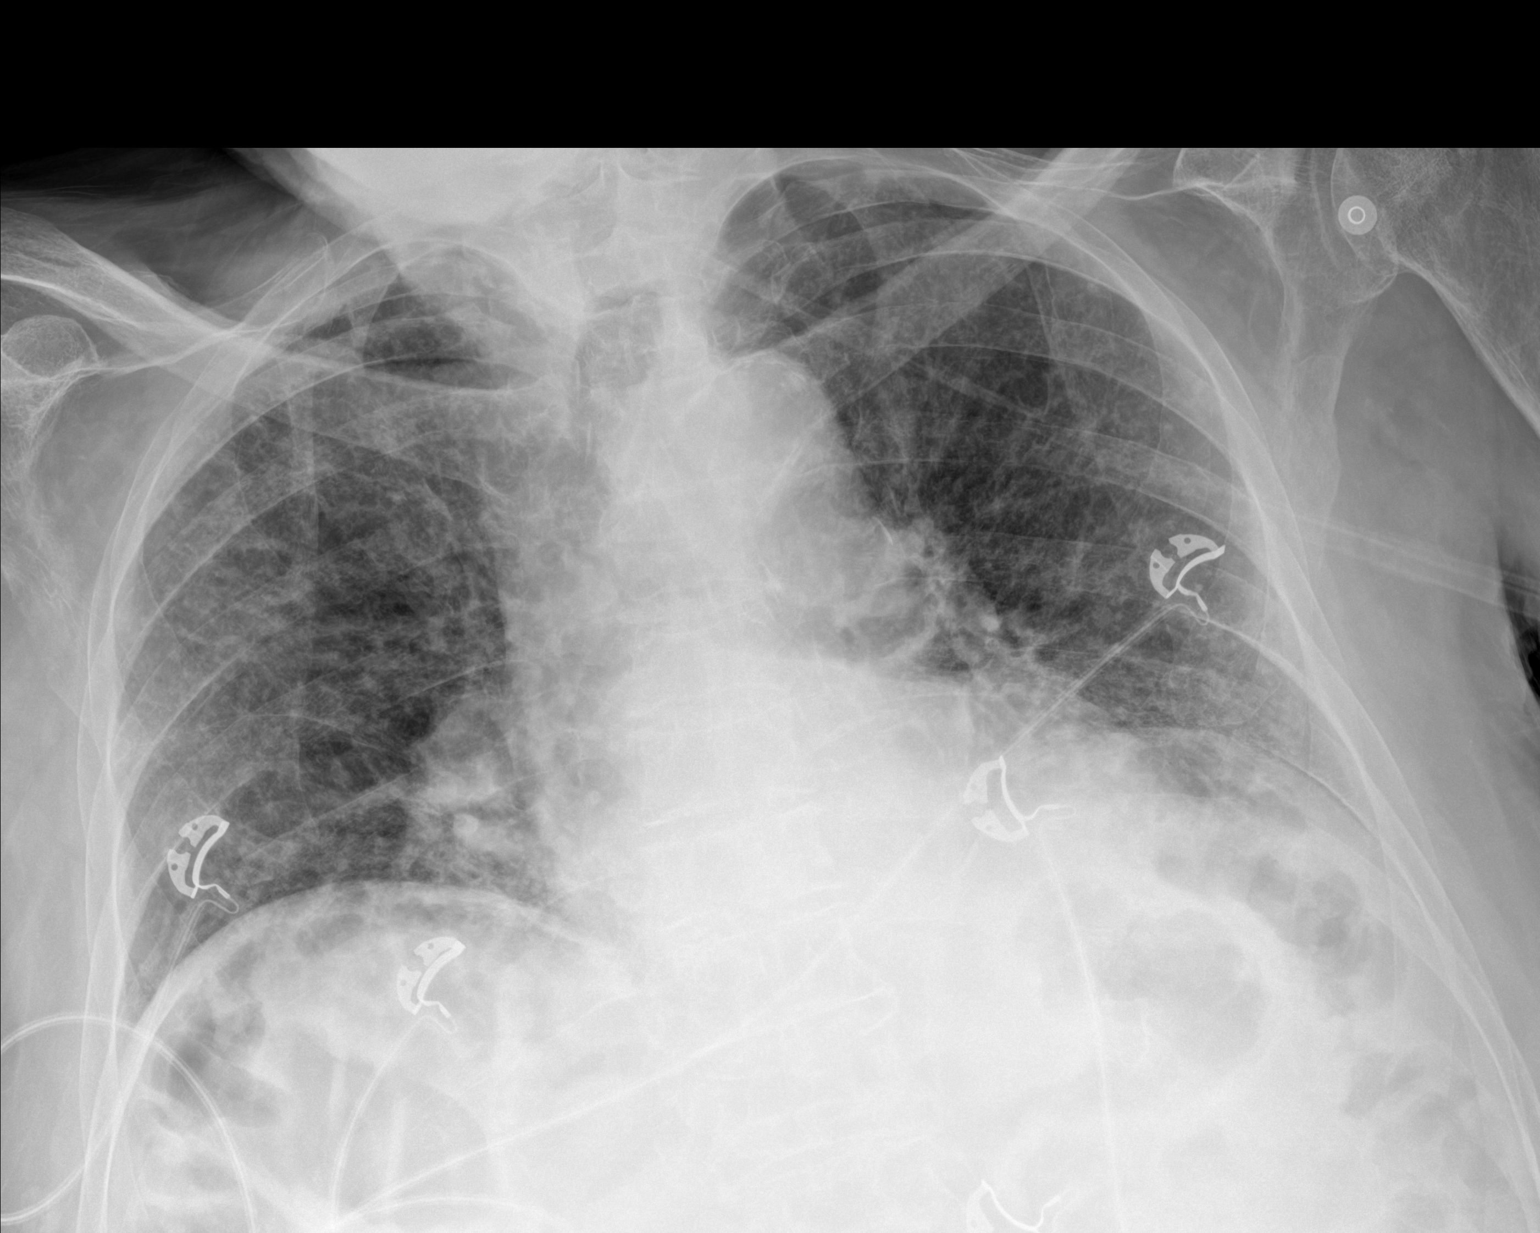

[1 of 1 positions shown; findings below may reference images not displayed]

FINDINGS: Trachea is midline. Lung volumes are low. Accounting for this
cardiomediastinal contours are stable and accentuated by AP
technique.

Patchy areas of interstitial and airspace opacity bilaterally. No
dense consolidation. Distension of bowel loops in the upper abdomen
as before. No sign of pleural effusion.

On limited assessment visualized skeletal structures without acute
process.
IMPRESSION: 1. Patchy areas of interstitial and airspace opacity on a background
of pulmonary emphysema, findings may reflect atypical infection in
this patient with reported history of [id number].
2. Distension of bowel loops in the upper abdomen as before.

## 2021-02-12 ENCOUNTER — Encounter (INDEPENDENT_AMBULATORY_CARE_PROVIDER_SITE_OTHER): Payer: Medicare HMO

## 2021-02-12 ENCOUNTER — Ambulatory Visit (INDEPENDENT_AMBULATORY_CARE_PROVIDER_SITE_OTHER): Payer: Medicare HMO | Admitting: Vascular Surgery
